# Patient Record
Sex: Male | Born: 1945 | Race: White | Hispanic: No | Marital: Married | Smoking: Former smoker
Health system: Southern US, Community
[De-identification: ages and names within clinical notes are randomized; demographics above are authoritative.]

## PROBLEM LIST (undated history)

## (undated) DIAGNOSIS — I5189 Other ill-defined heart diseases: Secondary | ICD-10-CM

## (undated) DIAGNOSIS — Z87442 Personal history of urinary calculi: Secondary | ICD-10-CM

## (undated) DIAGNOSIS — M5126 Other intervertebral disc displacement, lumbar region: Secondary | ICD-10-CM

## (undated) DIAGNOSIS — I252 Old myocardial infarction: Secondary | ICD-10-CM

## (undated) DIAGNOSIS — Z9889 Other specified postprocedural states: Secondary | ICD-10-CM

## (undated) DIAGNOSIS — F419 Anxiety disorder, unspecified: Secondary | ICD-10-CM

## (undated) DIAGNOSIS — K4090 Unilateral inguinal hernia, without obstruction or gangrene, not specified as recurrent: Secondary | ICD-10-CM

## (undated) DIAGNOSIS — I2510 Atherosclerotic heart disease of native coronary artery without angina pectoris: Secondary | ICD-10-CM

## (undated) DIAGNOSIS — K5790 Diverticulosis of intestine, part unspecified, without perforation or abscess without bleeding: Secondary | ICD-10-CM

## (undated) DIAGNOSIS — F410 Panic disorder [episodic paroxysmal anxiety] without agoraphobia: Secondary | ICD-10-CM

## (undated) DIAGNOSIS — I509 Heart failure, unspecified: Secondary | ICD-10-CM

## (undated) DIAGNOSIS — K8020 Calculus of gallbladder without cholecystitis without obstruction: Secondary | ICD-10-CM

## (undated) DIAGNOSIS — M1990 Unspecified osteoarthritis, unspecified site: Secondary | ICD-10-CM

## (undated) DIAGNOSIS — I358 Other nonrheumatic aortic valve disorders: Secondary | ICD-10-CM

## (undated) DIAGNOSIS — E785 Hyperlipidemia, unspecified: Secondary | ICD-10-CM

## (undated) DIAGNOSIS — D649 Anemia, unspecified: Secondary | ICD-10-CM

## (undated) DIAGNOSIS — I714 Abdominal aortic aneurysm, without rupture, unspecified: Secondary | ICD-10-CM

## (undated) DIAGNOSIS — Z720 Tobacco use: Secondary | ICD-10-CM

## (undated) DIAGNOSIS — E669 Obesity, unspecified: Secondary | ICD-10-CM

## (undated) DIAGNOSIS — J189 Pneumonia, unspecified organism: Secondary | ICD-10-CM

## (undated) DIAGNOSIS — F1010 Alcohol abuse, uncomplicated: Secondary | ICD-10-CM

## (undated) DIAGNOSIS — H269 Unspecified cataract: Secondary | ICD-10-CM

## (undated) DIAGNOSIS — T8859XA Other complications of anesthesia, initial encounter: Secondary | ICD-10-CM

## (undated) DIAGNOSIS — I10 Essential (primary) hypertension: Secondary | ICD-10-CM

## (undated) DIAGNOSIS — K760 Fatty (change of) liver, not elsewhere classified: Secondary | ICD-10-CM

## (undated) DIAGNOSIS — I219 Acute myocardial infarction, unspecified: Secondary | ICD-10-CM

## (undated) DIAGNOSIS — C61 Malignant neoplasm of prostate: Secondary | ICD-10-CM

## (undated) DIAGNOSIS — D696 Thrombocytopenia, unspecified: Secondary | ICD-10-CM

## (undated) DIAGNOSIS — Z8679 Personal history of other diseases of the circulatory system: Secondary | ICD-10-CM

## (undated) DIAGNOSIS — Z9841 Cataract extraction status, right eye: Secondary | ICD-10-CM

## (undated) DIAGNOSIS — J449 Chronic obstructive pulmonary disease, unspecified: Secondary | ICD-10-CM

## (undated) DIAGNOSIS — I340 Nonrheumatic mitral (valve) insufficiency: Secondary | ICD-10-CM

## (undated) DIAGNOSIS — C801 Malignant (primary) neoplasm, unspecified: Secondary | ICD-10-CM

## (undated) DIAGNOSIS — Z7982 Long term (current) use of aspirin: Secondary | ICD-10-CM

## (undated) DIAGNOSIS — N200 Calculus of kidney: Secondary | ICD-10-CM

## (undated) DIAGNOSIS — N529 Male erectile dysfunction, unspecified: Secondary | ICD-10-CM

## (undated) DIAGNOSIS — I700 Atherosclerosis of aorta: Secondary | ICD-10-CM

## (undated) DIAGNOSIS — I5032 Chronic diastolic (congestive) heart failure: Secondary | ICD-10-CM

## (undated) HISTORY — DX: Other ill-defined heart diseases: I51.89

## (undated) HISTORY — PX: CATARACT EXTRACTION: SUR2

## (undated) HISTORY — PX: BACK SURGERY: SHX140

## (undated) HISTORY — DX: Acute myocardial infarction, unspecified: I21.9

## (undated) HISTORY — DX: Chronic diastolic (congestive) heart failure: I50.32

## (undated) HISTORY — DX: Unspecified cataract: H26.9

## (undated) HISTORY — DX: Personal history of urinary calculi: Z87.442

## (undated) HISTORY — DX: Panic disorder (episodic paroxysmal anxiety): F41.0

## (undated) HISTORY — PX: COLONOSCOPY: SHX174

## (undated) HISTORY — DX: Obesity, unspecified: E66.9

## (undated) HISTORY — PX: MICRODISCECTOMY LUMBAR: SUR864

## (undated) HISTORY — DX: Alcohol abuse, uncomplicated: F10.10

## (undated) HISTORY — DX: Chronic obstructive pulmonary disease, unspecified: J44.9

## (undated) HISTORY — DX: Atherosclerotic heart disease of native coronary artery without angina pectoris: I25.10

## (undated) HISTORY — DX: Essential (primary) hypertension: I10

## (undated) HISTORY — DX: Abdominal aortic aneurysm, without rupture: I71.4

## (undated) HISTORY — DX: Hyperlipidemia, unspecified: E78.5

## (undated) HISTORY — PX: OTHER SURGICAL HISTORY: SHX169

## (undated) HISTORY — DX: Abdominal aortic aneurysm, without rupture, unspecified: I71.40

## (undated) HISTORY — DX: Other nonrheumatic aortic valve disorders: I35.8

## (undated) HISTORY — DX: Tobacco use: Z72.0

## (undated) HISTORY — DX: Fatty (change of) liver, not elsewhere classified: K76.0

---

## 2003-11-13 ENCOUNTER — Emergency Department (HOSPITAL_COMMUNITY): Admission: EM | Admit: 2003-11-13 | Discharge: 2003-11-14 | Payer: Self-pay | Admitting: Emergency Medicine

## 2005-10-05 DIAGNOSIS — I2119 ST elevation (STEMI) myocardial infarction involving other coronary artery of inferior wall: Secondary | ICD-10-CM

## 2005-10-05 HISTORY — DX: ST elevation (STEMI) myocardial infarction involving other coronary artery of inferior wall: I21.19

## 2006-08-05 HISTORY — PX: CARDIAC CATHETERIZATION: SHX172

## 2006-08-06 ENCOUNTER — Inpatient Hospital Stay (HOSPITAL_COMMUNITY): Admission: EM | Admit: 2006-08-06 | Discharge: 2006-08-09 | Payer: Self-pay | Admitting: Emergency Medicine

## 2006-10-05 HISTORY — PX: CARDIAC CATHETERIZATION: SHX172

## 2006-10-23 ENCOUNTER — Emergency Department (HOSPITAL_COMMUNITY): Admission: EM | Admit: 2006-10-23 | Discharge: 2006-10-23 | Payer: Self-pay | Admitting: Emergency Medicine

## 2008-05-12 ENCOUNTER — Ambulatory Visit: Payer: Self-pay | Admitting: Internal Medicine

## 2008-06-12 HISTORY — PX: MICRODISCECTOMY LUMBAR: SUR864

## 2010-04-10 ENCOUNTER — Ambulatory Visit: Payer: Self-pay | Admitting: Gastroenterology

## 2014-02-05 ENCOUNTER — Ambulatory Visit (INDEPENDENT_AMBULATORY_CARE_PROVIDER_SITE_OTHER): Payer: Medicare Other | Admitting: Cardiovascular Disease

## 2014-02-05 ENCOUNTER — Encounter (INDEPENDENT_AMBULATORY_CARE_PROVIDER_SITE_OTHER): Payer: Self-pay

## 2014-02-05 ENCOUNTER — Encounter: Payer: Self-pay | Admitting: Cardiovascular Disease

## 2014-02-05 VITALS — BP 153/89 | HR 58 | Ht 72.0 in | Wt 204.8 lb

## 2014-02-05 DIAGNOSIS — I10 Essential (primary) hypertension: Secondary | ICD-10-CM

## 2014-02-05 DIAGNOSIS — R0600 Dyspnea, unspecified: Secondary | ICD-10-CM

## 2014-02-05 DIAGNOSIS — E785 Hyperlipidemia, unspecified: Secondary | ICD-10-CM

## 2014-02-05 DIAGNOSIS — I714 Abdominal aortic aneurysm, without rupture, unspecified: Secondary | ICD-10-CM

## 2014-02-05 DIAGNOSIS — I2510 Atherosclerotic heart disease of native coronary artery without angina pectoris: Secondary | ICD-10-CM

## 2014-02-05 DIAGNOSIS — R000 Tachycardia, unspecified: Secondary | ICD-10-CM

## 2014-02-05 DIAGNOSIS — F17200 Nicotine dependence, unspecified, uncomplicated: Secondary | ICD-10-CM

## 2014-02-05 DIAGNOSIS — R0609 Other forms of dyspnea: Secondary | ICD-10-CM

## 2014-02-05 DIAGNOSIS — R0989 Other specified symptoms and signs involving the circulatory and respiratory systems: Secondary | ICD-10-CM

## 2014-02-05 DIAGNOSIS — Z720 Tobacco use: Secondary | ICD-10-CM

## 2014-02-05 MED ORDER — METOPROLOL SUCCINATE ER 50 MG PO TB24: 50.0000 mg | ORAL_TABLET | Freq: Every day | ORAL | Status: DC

## 2014-02-05 MED ORDER — ENALAPRIL MALEATE 20 MG PO TABS: 20.0000 mg | ORAL_TABLET | Freq: Every day | ORAL | Status: DC

## 2014-02-05 MED ORDER — ATORVASTATIN CALCIUM 80 MG PO TABS: 40.0000 mg | ORAL_TABLET | Freq: Every day | ORAL | Status: DC

## 2014-02-05 NOTE — Patient Instructions (Addendum)
Your physician has recommended you make the following change in your medication:  Take Atorvastatin 40 mg daily (1/2 a tablet)  Take Enalapril 20 mg once daily  Take Toprol XL 50 mg once daily   Your physician recommends that you return for lab work in:  Fasting labs in 6 weeks   Your physician has requested that you have an echocardiogram. Echocardiography is a painless test that uses sound waves to create images of your heart. It provides your doctor with information about the size and shape of your heart and how well your heart's chambers and valves are working. This procedure takes approximately one hour. There are no restrictions for this procedure.   Your physician wants you to follow-up in: 6 months. You will receive a reminder letter in the mail two months in advance. If you don't receive a letter, please call our office to schedule the follow-up appointment.

## 2014-02-09 ENCOUNTER — Encounter: Payer: Self-pay | Admitting: Cardiovascular Disease

## 2014-02-09 DIAGNOSIS — I10 Essential (primary) hypertension: Secondary | ICD-10-CM | POA: Insufficient documentation

## 2014-02-09 DIAGNOSIS — I714 Abdominal aortic aneurysm, without rupture, unspecified: Secondary | ICD-10-CM | POA: Insufficient documentation

## 2014-02-09 DIAGNOSIS — Z720 Tobacco use: Secondary | ICD-10-CM | POA: Insufficient documentation

## 2014-02-09 DIAGNOSIS — E785 Hyperlipidemia, unspecified: Secondary | ICD-10-CM | POA: Insufficient documentation

## 2014-02-09 DIAGNOSIS — I2510 Atherosclerotic heart disease of native coronary artery without angina pectoris: Secondary | ICD-10-CM | POA: Insufficient documentation

## 2014-02-09 NOTE — Assessment & Plan Note (Signed)
It appears that this was most recently evaluated in 2012. If this has not been checked recently, I will plan on obtaining abdominal aortic ultrasound.

## 2014-02-09 NOTE — Assessment & Plan Note (Signed)
I asked him to resume atorvastatin 40 mg once daily. Check fasting lipid and liver profile in 6 weeks.

## 2014-02-09 NOTE — Progress Notes (Addendum)
Primary care physician: Dr. Yates DecampJohn Sandoval  HPI  This is a 68 year old man who is here today to establish cardiovascular care. His wife Bobby Hashimotoatricia is one of my patients. He use to followup with Dr. Ty Sandoval but has not seen him in many years. He has known history of coronary artery disease with previous myocardial infarction in 2007. He was treated with bare-metal stent placement x2 to the mid RCA. He [facility]. He has chronic medical conditions that include hypertension, hyperlipidemia, tobacco use and COPD. He also seems to consume excessive alcohol with 12 pack/ beer daily. He denies any recreational drug use. He complains of significant exertional shortness of breath but no chest pain or discomfort. No orthopnea or PND. He has not been taking all his medications regularly.  No Known Allergies   No current outpatient prescriptions on file prior to visit.   No current facility-administered medications on file prior to visit.     Past Medical History  Diagnosis Date  . MI (myocardial infarction)   . Panic disorder   . COPD (chronic obstructive pulmonary disease)   . History of kidney stones   . Obesity   . Tobacco abuse   . AAA (abdominal aortic aneurysm)   . CAD (coronary artery disease)     Previous RCA stents x2 in 2007 and mid LAD in 2008  . Hyperlipidemia   . Hypertension      Past Surgical History  Procedure Laterality Date  . Microdiscectomy lumbar    . Cardiac catheterization  08/2006    x1 stent [facility]. Mid RCA: 3.5 x 16 mm overlap with 3.5 x 24 mm Liberte drug-eluting stents  . Cardiac catheterization  10/2006    x1 stent [facility]: Mid LAD: 2.5 x 28 mm Cypher drug-eluting     Family History  Problem Relation Age of Onset  . Hypertension Mother   . Heart disease Father      History   Social History  . Marital Status: Married    Spouse Name: N/A    Number of Children: N/A  . Years of Education: N/A    Occupational History  . Not on file.   Social History Main Topics  . Smoking status: Current Every Day Smoker -- 2.00 packs/day for 10 years    Types: Cigarettes  . Smokeless tobacco: Not on file  . Alcohol Use: Yes     Comment: daily  . Drug Use: No  . Sexual Activity: Not on file   Other Topics Concern  . Not on file   Social History Narrative  . No narrative on file     ROS A 10 point review of system was performed. It is negative other than that mentioned in the history of present illness.   PHYSICAL EXAM   BP 153/89  Pulse 58  Ht 6' (1.829 m)  Wt 204 lb 12 oz (92.874 kg)  BMI 27.76 kg/m2 Constitutional: He is oriented to person, place, and time. He appears well-developed and well-nourished. No distress.  HENT: No nasal discharge.  Head: Normocephalic and atraumatic.  Eyes: Pupils are equal and round.  No discharge. Neck: Normal range of motion. Neck supple. No JVD present. No thyromegaly present.  Cardiovascular: Normal rate, regular rhythm, normal heart sounds. Exam reveals no gallop and no friction rub. No murmur heard.  Pulmonary/Chest: Effort normal and breath sounds normal. No stridor. No respiratory distress. He has no wheezes. He has no rales.  He exhibits no tenderness.  Abdominal: Soft. Bowel sounds are normal. He exhibits no distension. There is no tenderness. There is no rebound and no guarding.  Musculoskeletal: Normal range of motion. He exhibits no edema and no tenderness.  Neurological: He is alert and oriented to person, place, and time. Coordination normal.  Skin: Skin is warm and dry. No rash noted. He is not diaphoretic. No erythema. No pallor.  Psychiatric: He has a normal mood and affect. His behavior is normal. Judgment and thought content normal.       ZOX:WRUEAEKG:Sinus  Bradycardia  -With rate variation  cv = 13. -RSR(V1) -nondiagnostic.   -Old anterior infarct.   ABNORMAL    ASSESSMENT AND PLAN

## 2014-02-09 NOTE — Assessment & Plan Note (Signed)
Blood pressure is mildly elevated but he has not been taking his medications regularly. I might consider switching metoprolol to carvedilol given resting bradycardia.

## 2014-02-09 NOTE — Assessment & Plan Note (Addendum)
I discussed with him the importance of smoking cessation. 

## 2014-02-09 NOTE — Assessment & Plan Note (Signed)
He seems to be doing reasonably well. However, he had significant exertional dyspnea. I recommend an echocardiogram to evaluate LV systolic function. We discussed the possibility of stress testing but he prefers not to go that route. Continue medical therapy for now.

## 2014-02-13 ENCOUNTER — Telehealth: Payer: Self-pay | Admitting: *Deleted

## 2014-02-13 DIAGNOSIS — I714 Abdominal aortic aneurysm, without rupture, unspecified: Secondary | ICD-10-CM

## 2014-02-13 NOTE — Telephone Encounter (Signed)
Informed patient of Dr. Aridas recommendation Patient verbalized understanding   

## 2014-02-13 NOTE — Telephone Encounter (Signed)
Message copied by Fransico SettersBAUCOM, Lancer Thurner E on Tue Feb 13, 2014  9:11 AM ------      Message from: Lorine BearsARIDA, MUHAMMAD A      Created: Fri Feb 09, 2014 10:55 AM       [facility]. That records mention an abdominal aortic aneurysm.      If this has not been checked in the last year, I recommend scheduling an abdominal aortic ultrasound in our office for evaluation. ------

## 2014-02-16 ENCOUNTER — Other Ambulatory Visit: Payer: Medicare Other

## 2014-02-19 ENCOUNTER — Other Ambulatory Visit: Payer: Self-pay

## 2014-02-19 ENCOUNTER — Other Ambulatory Visit (INDEPENDENT_AMBULATORY_CARE_PROVIDER_SITE_OTHER): Payer: Medicare Other

## 2014-02-19 ENCOUNTER — Encounter (INDEPENDENT_AMBULATORY_CARE_PROVIDER_SITE_OTHER): Payer: Medicare Other

## 2014-02-19 DIAGNOSIS — I714 Abdominal aortic aneurysm, without rupture, unspecified: Secondary | ICD-10-CM

## 2014-02-19 DIAGNOSIS — R000 Tachycardia, unspecified: Secondary | ICD-10-CM

## 2014-02-19 DIAGNOSIS — R0602 Shortness of breath: Secondary | ICD-10-CM

## 2014-02-19 DIAGNOSIS — I2510 Atherosclerotic heart disease of native coronary artery without angina pectoris: Secondary | ICD-10-CM

## 2014-02-22 ENCOUNTER — Telehealth: Payer: Self-pay | Admitting: *Deleted

## 2014-02-22 DIAGNOSIS — I714 Abdominal aortic aneurysm, without rupture, unspecified: Secondary | ICD-10-CM

## 2014-02-22 NOTE — Telephone Encounter (Signed)
Message copied by Fransico SettersBAUCOM, Mackensie Pilson E on Thu Feb 22, 2014 12:22 PM ------      Message from: Lorine BearsARIDA, MUHAMMAD A      Created: Wed Feb 21, 2014  2:54 PM       He does have an aortic aneurysm which is medium in size. No need for surgery now but this should be monitored. Recommend a follow up AAA duplex in 6 months. ------

## 2014-02-22 NOTE — Telephone Encounter (Signed)
error 

## 2014-03-19 ENCOUNTER — Ambulatory Visit (INDEPENDENT_AMBULATORY_CARE_PROVIDER_SITE_OTHER): Payer: Medicare Other | Admitting: *Deleted

## 2014-03-19 DIAGNOSIS — E785 Hyperlipidemia, unspecified: Secondary | ICD-10-CM

## 2014-03-20 LAB — LIPID PANEL
CHOL/HDL RATIO: 1.9 ratio (ref 0.0–5.0)
Cholesterol, Total: 168 mg/dL (ref 100–199)
HDL: 89 mg/dL (ref >39)
LDL CALC: 72 mg/dL (ref 0–99)
TRIGLYCERIDES: 34 mg/dL (ref 0–149)
VLDL CHOLESTEROL CAL: 7 mg/dL (ref 5–40)

## 2014-03-20 LAB — HEPATIC FUNCTION PANEL
ALBUMIN: 4.3 g/dL (ref 3.6–4.8)
ALK PHOS: 82 IU/L (ref 39–117)
ALT: 28 IU/L (ref 0–44)
AST: 29 IU/L (ref 0–40)
Bilirubin, Direct: 0.28 mg/dL (ref 0.00–0.40)
Total Bilirubin: 1 mg/dL (ref 0.0–1.2)
Total Protein: 6.4 g/dL (ref 6.0–8.5)

## 2014-10-15 ENCOUNTER — Encounter: Payer: Self-pay | Admitting: Cardiovascular Disease

## 2014-10-15 ENCOUNTER — Ambulatory Visit (INDEPENDENT_AMBULATORY_CARE_PROVIDER_SITE_OTHER): Payer: Medicare Other | Admitting: Cardiovascular Disease

## 2014-10-15 ENCOUNTER — Encounter (INDEPENDENT_AMBULATORY_CARE_PROVIDER_SITE_OTHER): Payer: Self-pay

## 2014-10-15 VITALS — BP 138/84 | HR 49 | Ht 72.0 in | Wt 200.8 lb

## 2014-10-15 DIAGNOSIS — Z720 Tobacco use: Secondary | ICD-10-CM

## 2014-10-15 DIAGNOSIS — I2510 Atherosclerotic heart disease of native coronary artery without angina pectoris: Secondary | ICD-10-CM

## 2014-10-15 DIAGNOSIS — I714 Abdominal aortic aneurysm, without rupture, unspecified: Secondary | ICD-10-CM

## 2014-10-15 DIAGNOSIS — E785 Hyperlipidemia, unspecified: Secondary | ICD-10-CM

## 2014-10-15 DIAGNOSIS — I10 Essential (primary) hypertension: Secondary | ICD-10-CM

## 2014-10-15 MED ORDER — ENALAPRIL MALEATE 20 MG PO TABS: 20.0000 mg | ORAL_TABLET | Freq: Two times a day (BID) | ORAL | Status: DC

## 2014-10-15 MED ORDER — ATORVASTATIN CALCIUM 80 MG PO TABS: 40.0000 mg | ORAL_TABLET | Freq: Every day | ORAL | Status: DC

## 2014-10-15 NOTE — Assessment & Plan Note (Signed)
Lab Results  Component Value Date   HDL 89 03/19/2014   LDLCALC 72 03/19/2014   TRIG 34 03/19/2014   CHOLHDL 1.9 03/19/2014   LDL is at target. Continue treatment with atorvastatin.

## 2014-10-15 NOTE — Assessment & Plan Note (Signed)
He is doing reasonably well and denies any symptoms suggestive of angina. Continue medical therapy.

## 2014-10-15 NOTE — Patient Instructions (Signed)
Your physician has recommended you make the following change in your medication:  Increase Enalapril to 20 mg twice daily   Your physician has requested that you have an abdominal aorta duplex in 6 months on the same day as your follow up visit with Dr. Kirke CorinArida. During this test, an ultrasound is used to evaluate the aorta. Allow 30 minutes for this exam. Do not eat after midnight the day before and avoid carbonated beverages  Your physician wants you to follow-up in: In 6 months. You will receive a reminder letter in the mail two months in advance. If you don't receive a letter, please call our office to schedule the follow-up appointment.

## 2014-10-15 NOTE — Addendum Note (Signed)
Addended by: Fransico SettersBAUCOM, Zania Kalisz E on: 10/15/2014 11:24 AM   Modules accepted: Orders

## 2014-10-15 NOTE — Assessment & Plan Note (Signed)
I recommend more aggressive control of blood pressure given abdominal aortic aneurysm. I change enalapril to 20 mg twice daily.

## 2014-10-15 NOTE — Progress Notes (Signed)
Primary care physician: Dr. Yates DecampJohn Sandoval  HPI  This is a 69 year old man who is here today for a follow-up visit regarding coronary artery disease.  He has known history of coronary artery disease with previous myocardial infarction in 2007. He was treated with bare-metal stent placement x2 to the mid RCA. He subsequently [facility]. He has chronic medical conditions that include hypertension, hyperlipidemia, tobacco use, abdominal aortic aneurysm  and COPD. most recent ultrasound showed the size to be 4 x 4 centimeters. He also seems to consume excessive alcohol with 12 pack/ beer daily. He denies any recreational drug use. He complains of significant exertional dyspnea but no chest pain. Dyspnea has been chronic. No orthopnea or PND.  Unfortunately, he continues to smoke. He drinks at least a sixpack of beer daily.  No Known Allergies   Current Outpatient Prescriptions on File Prior to Visit  Medication Sig Dispense Refill  . ALPRAZolam (XANAX) 0.5 MG tablet Take 0.5 mg by mouth 3 (three) times daily as needed for anxiety.    Marland Kitchen. aspirin 325 MG tablet Take 325 mg by mouth daily.    Marland Kitchen. atorvastatin (LIPITOR) 80 MG tablet Take 0.5 tablets (40 mg total) by mouth daily. 30 tablet 6  . enalapril (VASOTEC) 20 MG tablet Take 1 tablet (20 mg total) by mouth daily. 30 tablet 6  . metoprolol succinate (TOPROL-XL) 50 MG 24 hr tablet Take 1 tablet (50 mg total) by mouth daily. Take with or immediately following a meal. 30 tablet 6  . nitroGLYCERIN (NITROSTAT) 0.4 MG SL tablet Place 0.4 mg under the tongue every 5 (five) minutes as needed for chest pain.     No current facility-administered medications on file prior to visit.     Past Medical History  Diagnosis Date  . MI (myocardial infarction)   . Panic disorder   . COPD (chronic obstructive pulmonary disease)   . History of kidney stones   . Obesity   . Tobacco abuse   . AAA (abdominal aortic  aneurysm)   . Hyperlipidemia   . Hypertension   . AAA (abdominal aortic aneurysm)     3.4 cm 2012  . ETOH abuse   . CAD (coronary artery disease)     Previous RCA stents x2 in 2007 4 inferior ST elevation MI and mid LAD in 2008     Past Surgical History  Procedure Laterality Date  . Microdiscectomy lumbar    . Cardiac catheterization  08/2006    x1 stent [facility]. Mid RCA: 3.5 x 16 mm overlap with 3.5 x 24 mm Liberte bare-metal stents  . Cardiac catheterization  10/2006    x1 stent [facility]: Mid LAD: 2.5 x 28 mm Cypher drug-eluting     Family History  Problem Relation Age of Onset  . Hypertension Mother   . Heart disease Father      History   Social History  . Marital Status: Married    Spouse Name: N/A    Number of Children: N/A  . Years of Education: N/A   Occupational History  . Not on file.   Social History Main Topics  . Smoking status: Current Every Day Smoker -- 2.00 packs/day for 10 years    Types: Cigarettes  . Smokeless tobacco: Not on file  . Alcohol Use: Yes     Comment: daily  . Drug Use: No  . Sexual Activity: Not on file   Other Topics Concern  .  Not on file   Social History Narrative     ROS A 10 point review of system was performed. It is negative other than that mentioned in the history of present illness.   PHYSICAL EXAM   BP 138/84 mmHg  Pulse 49  Ht 6' (1.829 m)  Wt 200 lb 12 oz (91.06 kg)  BMI 27.22 kg/m2 Constitutional: He is oriented to person, place, and time. He appears well-developed and well-nourished. No distress.  HENT: No nasal discharge.  Head: Normocephalic and atraumatic.  Eyes: Pupils are equal and round.  No discharge. Neck: Normal range of motion. Neck supple. No JVD present. No thyromegaly present.  Cardiovascular: Normal rate, regular rhythm, normal heart sounds. Exam reveals no gallop and no friction rub. No murmur heard.  Pulmonary/Chest: Effort normal and breath sounds normal. No stridor. No respiratory distress. He  has no wheezes. He has no rales. He exhibits no tenderness.  Abdominal: Soft. Bowel sounds are normal. He exhibits no distension. There is no tenderness. There is no rebound and no guarding.  Musculoskeletal: Normal range of motion. He exhibits no edema and no tenderness.  Neurological: He is alert and oriented to person, place, and time. Coordination normal.  Skin: Skin is warm and dry. No rash noted. He is not diaphoretic. No erythema. No pallor.  Psychiatric: He has a normal mood and affect. His behavior is normal. Judgment and thought content normal.       EKG: Marked sinus  Bradycardia  -With rate variation  cv = 10. -RSR(V1) -nondiagnostic.   BORDERLINE RHYTHM    ASSESSMENT AND PLAN

## 2014-10-15 NOTE — Assessment & Plan Note (Signed)
I discussed with him the importance of smoking cessation but he is not able to quit at the present time.

## 2014-10-15 NOTE — Assessment & Plan Note (Signed)
This was 4 x 4 centimeter on most recent evaluation. I recommend a follow-up abdominal aortic ultrasound in 6 months from now.

## 2015-05-27 ENCOUNTER — Telehealth: Payer: Self-pay

## 2015-05-27 ENCOUNTER — Other Ambulatory Visit: Payer: Self-pay | Admitting: Cardiovascular Disease

## 2015-05-27 DIAGNOSIS — I714 Abdominal aortic aneurysm, without rupture, unspecified: Secondary | ICD-10-CM

## 2015-05-27 NOTE — Telephone Encounter (Signed)
Pt cannot remember his results from his AAA. Also, please verify pt is due for 6 months AAA.

## 2015-05-27 NOTE — Telephone Encounter (Signed)
S/w pt who states Bobby Sandoval contacted him and scheduled AAA for 8/23 Reviewed results of 5/15 test. Pt verbalized understanding with no further questions.

## 2015-05-28 ENCOUNTER — Ambulatory Visit (INDEPENDENT_AMBULATORY_CARE_PROVIDER_SITE_OTHER): Payer: Medicare Other

## 2015-05-28 DIAGNOSIS — I714 Abdominal aortic aneurysm, without rupture, unspecified: Secondary | ICD-10-CM

## 2015-05-31 ENCOUNTER — Other Ambulatory Visit: Payer: Self-pay

## 2015-05-31 DIAGNOSIS — I714 Abdominal aortic aneurysm, without rupture, unspecified: Secondary | ICD-10-CM

## 2015-07-01 ENCOUNTER — Other Ambulatory Visit: Payer: Self-pay | Admitting: *Deleted

## 2015-07-01 MED ORDER — ENALAPRIL MALEATE 20 MG PO TABS: 20.0000 mg | ORAL_TABLET | Freq: Two times a day (BID) | ORAL | Status: DC

## 2015-07-08 ENCOUNTER — Ambulatory Visit (INDEPENDENT_AMBULATORY_CARE_PROVIDER_SITE_OTHER): Payer: Medicare Other | Admitting: Cardiovascular Disease

## 2015-07-08 ENCOUNTER — Encounter: Payer: Self-pay | Admitting: Cardiovascular Disease

## 2015-07-08 VITALS — BP 110/62 | HR 55 | Ht 72.0 in | Wt 205.2 lb

## 2015-07-08 DIAGNOSIS — I25118 Atherosclerotic heart disease of native coronary artery with other forms of angina pectoris: Secondary | ICD-10-CM

## 2015-07-08 DIAGNOSIS — R0602 Shortness of breath: Secondary | ICD-10-CM | POA: Diagnosis not present

## 2015-07-08 DIAGNOSIS — I10 Essential (primary) hypertension: Secondary | ICD-10-CM | POA: Diagnosis not present

## 2015-07-08 DIAGNOSIS — Z720 Tobacco use: Secondary | ICD-10-CM

## 2015-07-08 DIAGNOSIS — I714 Abdominal aortic aneurysm, without rupture, unspecified: Secondary | ICD-10-CM

## 2015-07-08 DIAGNOSIS — I2510 Atherosclerotic heart disease of native coronary artery without angina pectoris: Secondary | ICD-10-CM

## 2015-07-08 DIAGNOSIS — Z23 Encounter for immunization: Secondary | ICD-10-CM

## 2015-07-08 DIAGNOSIS — E785 Hyperlipidemia, unspecified: Secondary | ICD-10-CM

## 2015-07-08 MED ORDER — ASPIRIN EC 81 MG PO TBEC: 81.0000 mg | DELAYED_RELEASE_TABLET | Freq: Every day | ORAL | Status: DC

## 2015-07-08 NOTE — Assessment & Plan Note (Signed)
Lab Results  Component Value Date   CHOL 168 03/19/2014   HDL 89 03/19/2014   LDLCALC 72 03/19/2014   TRIG 34 03/19/2014   CHOLHDL 1.9 03/19/2014   Continue treatment with atorvastatin. 

## 2015-07-08 NOTE — Assessment & Plan Note (Signed)
Blood pressure is better controlled now after increasing enalapril during last visit.

## 2015-07-08 NOTE — Assessment & Plan Note (Signed)
The patient has known history of coronary artery disease with previous stenting of the right coronary artery and LAD. He is having worsening exertional dyspnea with no recent ischemic cardiac evaluation. Thus, I recommend evaluation with a treadmill nuclear stress test. I decreased the dose of aspirin to 81 mg once daily.

## 2015-07-08 NOTE — Assessment & Plan Note (Signed)
I discussed with him the importance of smoking cessation and also decreasing alcohol intake. The patient reports inability to do that.

## 2015-07-08 NOTE — Patient Instructions (Addendum)
Medication Instructions:  Your physician has recommended you make the following change in your medication:  DECREASE aspirin to  once per day   Labwork: none  Testing/Procedures: Your physician has requested that you have a lexiscan myoview. For further information please visit [email or web address] Please follow instruction sheet, as given.  [facility] MYOVIEW  Your caregiver has ordered a Stress Test with nuclear imaging. The purpose of this test is to evaluate the blood supply to your heart muscle. This procedure is referred to as a "Non-Invasive Stress Test." This is because other than having an IV started in your vein, nothing is inserted or "invades" your body. Cardiac stress tests are done to find areas of poor blood flow to the heart by determining the extent of coronary artery disease (CAD). Some patients exercise on a treadmill, which naturally increases the blood flow to your heart, while others who are  unable to walk on a treadmill due to physical limitations have a pharmacologic/chemical stress agent called Lexiscan . This medicine will mimic walking on a treadmill by temporarily increasing your coronary blood flow.   Please note: these test may take anywhere between 2-4 hours to complete  PLEASE REPORT TO [facility]  THE VOLUNTEERS AT THE FIRST DESK WILL DIRECT YOU WHERE TO GO  Date of Procedure: Thurs, Oct 6, 8:00am Arrival Time for Procedure: 7:30am  Instructions regarding medication:    _xx___:  Hold metoprolol night before procedure and morning of procedure  PLEASE NOTIFY THE OFFICE AT LEAST 24 HOURS IN ADVANCE IF YOU ARE UNABLE TO KEEP YOUR APPOINTMENT.  [phone] AND  PLEASE NOTIFY NUCLEAR MEDICINE AT [facility] AT LEAST 24 HOURS IN ADVANCE IF YOU ARE UNABLE TO KEEP YOUR APPOINTMENT. [phone]  How to prepare for your Myoview test:   Do not eat or drink after midnight  No caffeine for 24 hours prior to test  No smoking 24 hours prior to  test.  Your medication may be taken with water.  If your doctor stopped a medication because of this test, do not take that medication.  Ladies, please do not wear dresses.  Skirts or pants are appropriate. Please wear a short sleeve shirt.  No perfume, cologne or lotion.  Wear comfortable walking shoes. No heels!            Follow-Up: Your physician wants you to follow-up in: six months with Dr. Kirke Corin.  You will receive a reminder letter in the mail two months in advance. If you don't receive a letter, please call our office to schedule the follow-up appointment.   Any Other Special Instructions Will Be Listed Below (If Applicable).

## 2015-07-08 NOTE — Assessment & Plan Note (Signed)
This was 4 x 4 centimeter on most recent evaluation. I recommend a follow-up abdominal aortic ultrasound in 6 months from now.

## 2015-07-08 NOTE — Progress Notes (Signed)
Primary care physician: Dr. Yates DecampJohn Sandoval  HPI  This is a 69 year old man who is here today for a follow-up visit regarding coronary artery disease.  He has known history of coronary artery disease with previous myocardial infarction in 2007. He was treated with bare-metal stent placement x2 to the mid RCA. He subsequently [facility]. He has chronic medical conditions that include hypertension, hyperlipidemia, tobacco use, abdominal aortic aneurysm , excessive alcohol use and COPD. most recent ultrasound showed the size to be 4 x 4 centimeters.  He reports worsening exertional dyspnea without chest discomfort. This has been more noticeable over the last 6 months. No orthopnea or PND. He also noted some leg edema worse on the right side. He denies claudication. Unfortunately, he continues to smoke 2 packs per day and drinks a 12 pack of beer daily.   No Known Allergies   Current Outpatient Prescriptions on File Prior to Visit  Medication Sig Dispense Refill  . ALPRAZolam (XANAX) 0.5 MG tablet Take 0.5 mg by mouth 3 (three) times daily as needed for anxiety.    Marland Kitchen. aspirin 325 MG tablet Take 325 mg by mouth daily.    Marland Kitchen. atorvastatin (LIPITOR) 80 MG tablet Take 0.5 tablets (40 mg total) by mouth daily. 30 tablet 6  . enalapril (VASOTEC) 20 MG tablet Take 1 tablet (20 mg total) by mouth 2 (two) times daily. 60 tablet 3  . metoprolol succinate (TOPROL-XL) 50 MG 24 hr tablet Take 1 tablet (50 mg total) by mouth daily. Take with or immediately following a meal. 30 tablet 6  . nitroGLYCERIN (NITROSTAT) 0.4 MG SL tablet Place 0.4 mg under the tongue every 5 (five) minutes as needed for chest pain.     No current facility-administered medications on file prior to visit.     Past Medical History  Diagnosis Date  . MI (myocardial infarction) (HCC)   . Panic disorder   . COPD (chronic obstructive pulmonary disease) (HCC)   . History of kidney stones     . Obesity   . Tobacco abuse   . AAA (abdominal aortic aneurysm) (HCC)   . Hyperlipidemia   . Hypertension   . AAA (abdominal aortic aneurysm) (HCC)     3.4 cm 2012  . ETOH abuse   . CAD (coronary artery disease)     Previous RCA stents x2 in 2007 4 inferior ST elevation MI and mid LAD in 2008     Past Surgical History  Procedure Laterality Date  . Microdiscectomy lumbar    . Cardiac catheterization  08/2006    x1 stent [facility]. Mid RCA: 3.5 x 16 mm overlap with 3.5 x 24 mm Liberte bare-metal stents  . Cardiac catheterization  10/2006    x1 stent [facility]: Mid LAD: 2.5 x 28 mm Cypher drug-eluting     Family History  Problem Relation Age of Onset  . Hypertension Mother   . Heart disease Father      Social History   Social History  . Marital Status: Married    Spouse Name: N/A  . Number of Children: N/A  . Years of Education: N/A   Occupational History  . Not on file.   Social History Main Topics  . Smoking status: Current Every Day Smoker -- 2.00 packs/day for 10 years    Types: Cigarettes  . Smokeless tobacco: Not on file  . Alcohol Use: Yes     Comment: daily  . Drug  Use: No  . Sexual Activity: Not on file   Other Topics Concern  . Not on file   Social History Narrative     ROS A 10 point review of system was performed. It is negative other than that mentioned in the history of present illness.   PHYSICAL EXAM   BP 110/62 mmHg  Pulse 55  Ht 6' (1.829 m)  Wt 205 lb 4 oz (93.101 kg)  BMI 27.83 kg/m2 Constitutional: He is oriented to person, place, and time. He appears well-developed and well-nourished. No distress.  HENT: No nasal discharge.  Head: Normocephalic and atraumatic.  Eyes: Pupils are equal and round.  No discharge. Neck: Normal range of motion. Neck supple. No JVD present. No thyromegaly present.  Cardiovascular: Normal rate, regular rhythm, normal heart sounds. Exam reveals no gallop and no friction rub. No murmur heard.  Pulmonary/Chest:  Effort normal and breath sounds normal. No stridor. No respiratory distress. He has no wheezes. He has no rales. He exhibits no tenderness.  Abdominal: Soft. Bowel sounds are normal. He exhibits no distension. There is no tenderness. There is no rebound and no guarding.  Musculoskeletal: Normal range of motion. He exhibits trace edema and no tenderness.  Neurological: He is alert and oriented to person, place, and time. Coordination normal.  Skin: Skin is warm and dry. No rash noted. He is not diaphoretic. No erythema. No pallor.  Psychiatric: He has a normal mood and affect. His behavior is normal. Judgment and thought content normal.       EKG: Sinus bradycardia with sinus arrhythmia.  ASSESSMENT AND PLAN

## 2015-07-11 ENCOUNTER — Encounter
Admission: RE | Admit: 2015-07-11 | Discharge: 2015-07-11 | Disposition: A | Discharge status: Home / Self Care | Payer: Medicare Other | Source: Ambulatory Visit | Attending: Cardiovascular Disease | Admitting: Cardiovascular Disease

## 2015-07-11 DIAGNOSIS — I25118 Atherosclerotic heart disease of native coronary artery with other forms of angina pectoris: Secondary | ICD-10-CM

## 2015-07-11 DIAGNOSIS — R0602 Shortness of breath: Secondary | ICD-10-CM

## 2015-07-11 LAB — NM MYOCAR MULTI W/SPECT W/WALL MOTION / EF
CHL CUP NUCLEAR SDS: 6
CHL CUP NUCLEAR SRS: 9
CHL CUP RESTING HR STRESS: 70 {beats}/min
CHL CUP STRESS STAGE 1 GRADE: 0 %
CHL CUP STRESS STAGE 10 DBP: 73 mmHg
CHL CUP STRESS STAGE 2 GRADE: 0 %
CHL CUP STRESS STAGE 2 HR: 62 {beats}/min
CHL CUP STRESS STAGE 2 SPEED: 0 mph
CHL CUP STRESS STAGE 4 SPEED: 1 mph
CHL CUP STRESS STAGE 5 GRADE: 0 %
CHL CUP STRESS STAGE 5 SPEED: 1 mph
CHL CUP STRESS STAGE 6 DBP: 68 mmHg
CHL CUP STRESS STAGE 6 SBP: 163 mmHg
CHL CUP STRESS STAGE 8 SPEED: 2.2 mph
CHL CUP STRESS STAGE 9 HR: 117 {beats}/min
CHL CUP STRESS STAGE 9 SPEED: 0 mph
CSEPEW: 5.4 METS
CSEPHR: 87 %
CSEPPHR: 131 {beats}/min
CSEPPMHR: 87 %
LV dias vol: 167 mL
LV sys vol: 87 mL
NUC STRESS TID: 0.98
SSS: 13
Stage 1 HR: 60 {beats}/min
Stage 1 Speed: 0 mph
Stage 10 Grade: 0 %
Stage 10 HR: 90 {beats}/min
Stage 10 SBP: 194 mmHg
Stage 10 Speed: 0 mph
Stage 3 Grade: 0 %
Stage 3 HR: 62 {beats}/min
Stage 3 Speed: 0 mph
Stage 4 Grade: 0 %
Stage 4 HR: 78 {beats}/min
Stage 5 HR: 78 {beats}/min
Stage 6 Grade: 10 %
Stage 6 HR: 118 {beats}/min
Stage 6 Speed: 1.7 mph
Stage 7 Grade: 12 %
Stage 7 HR: 129 {beats}/min
Stage 7 Speed: 2.5 mph
Stage 8 Grade: 12 %
Stage 8 HR: 131 {beats}/min
Stage 9 Grade: 0 %

## 2015-07-11 MED ORDER — TECHNETIUM TC 99M SESTAMIBI - CARDIOLITE
29.9220 | Freq: Once | INTRAVENOUS | Status: AC | PRN
  Administered 2015-07-11: 29.922 via INTRAVENOUS

## 2015-07-11 MED ORDER — TECHNETIUM TC 99M SESTAMIBI - CARDIOLITE
12.2130 | Freq: Once | INTRAVENOUS | Status: AC | PRN
  Administered 2015-07-11: 12.213 via INTRAVENOUS

## 2015-07-17 ENCOUNTER — Telehealth: Payer: Self-pay

## 2015-07-17 ENCOUNTER — Other Ambulatory Visit: Payer: Self-pay

## 2015-07-17 DIAGNOSIS — Z01812 Encounter for preprocedural laboratory examination: Secondary | ICD-10-CM

## 2015-07-17 NOTE — Telephone Encounter (Signed)
Pt is calling to set his cath appt. Please call.

## 2015-07-17 NOTE — Telephone Encounter (Signed)
S/w pt who states he is agreeable to catheterization w/Dr. Kirke CorinArida.  States he is hesit[facility] to have [facility] because "I've lived here all my life and I've never heard anything good about [facility]" [facility]. Pt states [facility] is [facility]. Advised pt of need for labs and cxr by Monday 10/17. Informed pt I will have orders and cath instructions at the front desk today. Pt states he will come by today for packet. Cath scheduled 10/20 at 8:30am

## 2015-07-17 NOTE — Patient Instructions (Signed)
Your physician has requested that you have a cardiac catheterization. Cardiac catheterization is used to diagnose and/or treat various heart conditions. Doctors may recommend this procedure for a number of different reasons. The most common reason is to evaluate chest pain. Chest pain can be a symptom of coronary artery disease (CAD), and cardiac catheterization can show whether plaque is narrowing or blocking your heart's arteries. This procedure is also used to evaluate the valves, as well as measure the blood flow and oxygen levels in different parts of your heart. For further information please visit [email or web address] Please follow instruction sheet, as given.  [facility] Cardiac Cath Instructions   You are scheduled for a Cardiac Cath on: Thursday, October 20, 8:30am  Please arrive at 7:30am on the day of your procedure  Do not eat/drink anything after midnight  Someone will need to drive you home  It is recommended someone be with you for the first 24 hours after your procedure  Wear clothes that are easy to get on/off and wear slip on shoes if possible   Medications bring a current list of all medications with you  ___ You may take all of your medications the morning of your procedure with enough water to swallow safely  __xx_ Do not take these medications the morning before your procedure: enalapril and metoprolol   Day of your procedure: [facility] at [facility] entrance.  Free valet service is available.  After entering the [facility] please check-in at the registration desk (1st desk on your right) to receive your armband. After receiving your armband someone will escort you to the cardiac cath/special procedures waiting area.  The usual length of stay after your procedure is about 2 to 3 hours.  This can vary.  If you have any questions, please call our office at [phone], or you may call the cardiac cath lab at [facility] directly at [phone] Angiogram An angiogram,  also called angiography, is a procedure used to look at the blood vessels. In this procedure, dye is injected through a long, thin tube (catheter) into an artery. X-rays are then taken. The X-rays will show if there is a blockage or problem in a blood vessel.  LET [facility] PROVIDER KNOW ABOUT:  Any allergies you have, including allergies to shellfish or contrast dye.   All medicines you are taking, including vitamins, herbs, eye drops, creams, and over-the-counter medicines.   Previous problems you or members of your family have had with the use of anesthetics.   Any blood disorders you have.   Previous surgeries you have had.  Any previous kidney problems or failure you have had.  Medical conditions you have.   Possibility of pregnancy, if this applies. RISKS AND COMPLICATIONS Generally, an angiogram is a safe procedure. However, as with any procedure, problems can occur. Possible problems include:  Injury to the blood vessels, including rupture or bleeding.  Infection or bruising at the catheter site.  Allergic reaction to the dye or contrast used.  Kidney damage from the dye or contrast used.  Blood clots that can lead to a stroke or heart attack. BEFORE THE PROCEDURE  Do not eat or drink after midnight on the night before the procedure, or as directed by your health care provider.   Ask your health care provider if you may drink enough water to take any needed medicines the morning of the procedure.  PROCEDURE  You may be given a medicine to help you relax (sedative) before and during  the procedure. This medicine is given through an IV access tube that is inserted into one of your veins.   The area where the catheter will be inserted will be washed and shaved. This is usually done in the groin but may be done in the fold of your arm (near your elbow) or in the wrist.  A medicine will be given to numb the area where the catheter will be inserted (local  anesthetic).  The catheter will be inserted with a guide wire into an artery. The catheter is guided by using a type of X-ray (fluoroscopy) to the blood vessel being examined.   Dye is then injected into the catheter, and X-rays are taken. The dye helps to show where any narrowing or blockages are located.  AFTER THE PROCEDURE   If the procedure is done through the leg, you will be kept in bed lying flat for several hours. You will be instructed to not bend or cross your legs.  The insertion site will be checked frequently.  The pulse in your feet or wrist will be checked frequently.  Additional blood tests, X-rays, and electrocardiography may be done.   You may need to stay in the hospital overnight for observation.    This information is not intended to replace advice given to you by your health care provider. Make sure you discuss any questions you have with your health care provider.   Document Released: 07/01/2005 Document Revised: 10/12/2014 Document Reviewed: 02/22/2013 Elsevier Interactive Patient Education 2016 Else[employer] Inc. Angiogram, Care After Refer to this sheet in the next few weeks. These instructions provide you with information about caring for yourself after your procedure. Your health care provider may also give you more specific instructions. Your treatment has been planned according to current medical practices, but problems sometimes occur. Call your health care provider if you have any problems or questions after your procedure. WHAT TO EXPECT AFTER THE PROCEDURE After your procedure, it is typical to have the following:  Bruising at the catheter insertion site that usually fades within 1-2 weeks.  Blood collecting in the tissue (hematoma) that may be painful to the touch. It should usually decrease in size and tenderness within 1-2 weeks. HOME CARE INSTRUCTIONS  Take medicines only as directed by your health care provider.  You may shower 24-48 hours  after the procedure or as directed by your health care provider. Remove the bandage (dressing) and gently wash the site with plain soap and water. Pat the area dry with a clean towel. Do not rub the site, because this may cause bleeding.  Do not take baths, swim, or use a hot tub until your health care provider approves.  Check your insertion site every day for redness, swelling, or drainage.  Do not apply powder or lotion to the site.  Do not lift over 10 lb (4.5 kg) for 5 days after your procedure or as directed by your health care provider.  Ask your health care provider when it is okay to:  Return to work or school.  Resume usual physical activities or sports.  Resume sexual activity.  Do not drive home if you are discharged the same day as the procedure. Have someone else drive you.  You may drive 24 hours after the procedure unless otherwise instructed by your health care provider.  Do not operate machinery or power tools for 24 hours after the procedure or as directed by your health care provider.  If your procedure was done as  an outpatient procedure, which means that you went home the same day as your procedure, a responsible adult should be with you for the first 24 hours after you arrive home.  Keep all follow-up visits as directed by your health care provider. This is important. SEEK MEDICAL CARE IF:  You have a fever.  You have chills.  You have increased bleeding from the catheter insertion site. Hold pressure on the site. SEEK IMMEDIATE MEDICAL CARE IF:  You have unusual pain at the catheter insertion site.  You have redness, warmth, or swelling at the catheter insertion site.  You have drainage (other than a small amount of blood on the dressing) from the catheter insertion site.  The catheter insertion site is bleeding, and the bleeding does not stop after 30 minutes of holding steady pressure on the site.  The area near or just beyond the catheter  insertion site becomes pale, cool, tingly, or numb.   This information is not intended to replace advice given to you by your health care provider. Make sure you discuss any questions you have with your health care provider.   Document Released: 04/09/2005 Document Revised: 10/12/2014 Document Reviewed: 02/22/2013 Elsevier Interactive Patient Education [employer]

## 2015-07-19 ENCOUNTER — Other Ambulatory Visit
Admission: RE | Admit: 2015-07-19 | Discharge: 2015-07-19 | Disposition: A | Discharge status: Home / Self Care | Payer: Medicare Other | Source: Ambulatory Visit | Attending: *Deleted | Admitting: *Deleted

## 2015-07-19 ENCOUNTER — Ambulatory Visit
Admission: RE | Admit: 2015-07-19 | Discharge: 2015-07-19 | Disposition: A | Discharge status: Home / Self Care | Payer: Medicare Other | Source: Ambulatory Visit | Attending: Cardiovascular Disease | Admitting: Cardiovascular Disease

## 2015-07-19 DIAGNOSIS — Z01812 Encounter for preprocedural laboratory examination: Secondary | ICD-10-CM

## 2015-07-19 DIAGNOSIS — Z01818 Encounter for other preprocedural examination: Secondary | ICD-10-CM | POA: Diagnosis present

## 2015-07-19 DIAGNOSIS — J439 Emphysema, unspecified: Secondary | ICD-10-CM | POA: Diagnosis not present

## 2015-07-19 LAB — BASIC METABOLIC PANEL
Anion gap: 7 (ref 5–15)
BUN: 8 mg/dL (ref 6–20)
CHLORIDE: 101 mmol/L (ref 101–111)
CO2: 29 mmol/L (ref 22–32)
CREATININE: 0.92 mg/dL (ref 0.61–1.24)
Calcium: 9.5 mg/dL (ref 8.9–10.3)
GFR calc non Af Amer: >60 mL/min (ref >60)
Glucose, Bld: 107 mg/dL — ABNORMAL HIGH (ref 65–99)
POTASSIUM: 5.1 mmol/L (ref 3.5–5.1)
SODIUM: 137 mmol/L (ref 135–145)

## 2015-07-19 LAB — CBC
HEMATOCRIT: 51.1 % (ref 40.0–52.0)
Hemoglobin: 17.2 g/dL (ref 13.0–18.0)
MCH: 31.7 pg (ref 26.0–34.0)
MCHC: 33.6 g/dL (ref 32.0–36.0)
MCV: 94.4 fL (ref 80.0–100.0)
PLATELETS: 182 10*3/uL (ref 150–440)
RBC: 5.41 MIL/uL (ref 4.40–5.90)
RDW: 13.4 % (ref 11.5–14.5)
WBC: 6.4 10*3/uL (ref 3.8–10.6)

## 2015-07-19 LAB — PROTIME-INR
INR: 1.05
Prothrombin Time: 13.9 seconds (ref 11.4–15.0)

## 2015-07-23 ENCOUNTER — Telehealth: Payer: Self-pay

## 2015-07-23 NOTE — Telephone Encounter (Signed)
Pt would like more details on stress test results. Please call.

## 2015-07-24 NOTE — Telephone Encounter (Signed)
S/w pt to answer questions regarding stress test results and upcoming cath. Inquired as to amount of time for procedure, if need for overnight stay at hospital. Informed pt that if stent is placed, he would be admitted for observation. Reviewed medications to hold, time, location. Pt verbalized understanding with no further questions.

## 2015-07-25 ENCOUNTER — Ambulatory Visit
Admission: RE | Admit: 2015-07-25 | Discharge: 2015-07-25 | Disposition: A | Discharge status: Home / Self Care | Payer: Medicare Other | Source: Ambulatory Visit | Attending: Cardiovascular Disease | Admitting: Cardiovascular Disease

## 2015-07-25 ENCOUNTER — Encounter: Payer: Self-pay | Admitting: *Deleted

## 2015-07-25 ENCOUNTER — Encounter
Admission: RE | Disposition: A | Discharge status: Home / Self Care | Payer: Self-pay | Source: Ambulatory Visit | Attending: Cardiovascular Disease

## 2015-07-25 DIAGNOSIS — E669 Obesity, unspecified: Secondary | ICD-10-CM | POA: Diagnosis not present

## 2015-07-25 DIAGNOSIS — Z955 Presence of coronary angioplasty implant and graft: Secondary | ICD-10-CM | POA: Diagnosis not present

## 2015-07-25 DIAGNOSIS — F17210 Nicotine dependence, cigarettes, uncomplicated: Secondary | ICD-10-CM | POA: Insufficient documentation

## 2015-07-25 DIAGNOSIS — E785 Hyperlipidemia, unspecified: Secondary | ICD-10-CM | POA: Diagnosis not present

## 2015-07-25 DIAGNOSIS — I25118 Atherosclerotic heart disease of native coronary artery with other forms of angina pectoris: Secondary | ICD-10-CM

## 2015-07-25 DIAGNOSIS — R0600 Dyspnea, unspecified: Secondary | ICD-10-CM | POA: Diagnosis not present

## 2015-07-25 DIAGNOSIS — I714 Abdominal aortic aneurysm, without rupture: Secondary | ICD-10-CM | POA: Insufficient documentation

## 2015-07-25 DIAGNOSIS — I2510 Atherosclerotic heart disease of native coronary artery without angina pectoris: Secondary | ICD-10-CM | POA: Diagnosis present

## 2015-07-25 DIAGNOSIS — Z79899 Other long term (current) drug therapy: Secondary | ICD-10-CM | POA: Insufficient documentation

## 2015-07-25 DIAGNOSIS — Z8249 Family history of ischemic heart disease and other diseases of the circulatory system: Secondary | ICD-10-CM | POA: Diagnosis not present

## 2015-07-25 DIAGNOSIS — Z7982 Long term (current) use of aspirin: Secondary | ICD-10-CM | POA: Insufficient documentation

## 2015-07-25 DIAGNOSIS — F1010 Alcohol abuse, uncomplicated: Secondary | ICD-10-CM | POA: Insufficient documentation

## 2015-07-25 DIAGNOSIS — I10 Essential (primary) hypertension: Secondary | ICD-10-CM | POA: Insufficient documentation

## 2015-07-25 DIAGNOSIS — Z87442 Personal history of urinary calculi: Secondary | ICD-10-CM | POA: Diagnosis not present

## 2015-07-25 DIAGNOSIS — J449 Chronic obstructive pulmonary disease, unspecified: Secondary | ICD-10-CM | POA: Insufficient documentation

## 2015-07-25 DIAGNOSIS — R600 Localized edema: Secondary | ICD-10-CM | POA: Diagnosis not present

## 2015-07-25 DIAGNOSIS — I252 Old myocardial infarction: Secondary | ICD-10-CM | POA: Diagnosis not present

## 2015-07-25 DIAGNOSIS — R9439 Abnormal result of other cardiovascular function study: Secondary | ICD-10-CM | POA: Insufficient documentation

## 2015-07-25 DIAGNOSIS — Z6827 Body mass index (BMI) 27.0-27.9, adult: Secondary | ICD-10-CM | POA: Insufficient documentation

## 2015-07-25 DIAGNOSIS — I25119 Atherosclerotic heart disease of native coronary artery with unspecified angina pectoris: Secondary | ICD-10-CM | POA: Insufficient documentation

## 2015-07-25 DIAGNOSIS — I259 Chronic ischemic heart disease, unspecified: Secondary | ICD-10-CM | POA: Insufficient documentation

## 2015-07-25 DIAGNOSIS — R931 Abnormal findings on diagnostic imaging of heart and coronary circulation: Secondary | ICD-10-CM

## 2015-07-25 HISTORY — PX: CARDIAC CATHETERIZATION: SHX172

## 2015-07-25 SURGERY — LEFT HEART CATH
Anesthesia: Moderate Sedation

## 2015-07-25 MED ORDER — SODIUM CHLORIDE 0.9 % IV SOLN
250.0000 mL | INTRAVENOUS | Status: DC | PRN
  Administered 2015-07-25: 1000 mL via INTRAVENOUS

## 2015-07-25 MED ORDER — MIDAZOLAM HCL 2 MG/2ML IJ SOLN
INTRAMUSCULAR | Status: DC | PRN
Start: 2015-07-25 — End: 2015-07-25
  Administered 2015-07-25 (×2): 1 mg via INTRAVENOUS

## 2015-07-25 MED ORDER — VERAPAMIL HCL 2.5 MG/ML IV SOLN
INTRAVENOUS | Status: DC | PRN
  Administered 2015-07-25: 5 mg via INTRA_ARTERIAL

## 2015-07-25 MED ORDER — SODIUM CHLORIDE 0.9 % IJ SOLN: 3.0000 mL | Freq: Two times a day (BID) | INTRAMUSCULAR | Status: DC

## 2015-07-25 MED ORDER — IOHEXOL 300 MG/ML  SOLN
INTRAMUSCULAR | Status: DC | PRN
  Administered 2015-07-25: 85 mL via INTRA_ARTERIAL

## 2015-07-25 MED ORDER — SODIUM CHLORIDE 0.9 % IV SOLN: 250.0000 mL | INTRAVENOUS | Status: DC | PRN

## 2015-07-25 MED ORDER — SODIUM CHLORIDE 0.9 % IJ SOLN: 3.0000 mL | INTRAMUSCULAR | Status: DC | PRN

## 2015-07-25 MED ORDER — HEPARIN SODIUM (PORCINE) 1000 UNIT/ML IJ SOLN
INTRAMUSCULAR | Status: AC
  Filled 2015-07-25: qty 1

## 2015-07-25 MED ORDER — SODIUM CHLORIDE 0.9 % WEIGHT BASED INFUSION: 1.0000 mL/kg/h | INTRAVENOUS | Status: DC

## 2015-07-25 MED ORDER — MIDAZOLAM HCL 2 MG/2ML IJ SOLN
INTRAMUSCULAR | Status: AC
  Filled 2015-07-25: qty 2

## 2015-07-25 MED ORDER — HEPARIN (PORCINE) IN NACL 2-0.9 UNIT/ML-% IJ SOLN
INTRAMUSCULAR | Status: AC
  Filled 2015-07-25: qty 1000

## 2015-07-25 MED ORDER — SODIUM CHLORIDE 0.9 % WEIGHT BASED INFUSION: 3.0000 mL/kg/h | INTRAVENOUS | Status: DC

## 2015-07-25 MED ORDER — HEPARIN SODIUM (PORCINE) 1000 UNIT/ML IJ SOLN
INTRAMUSCULAR | Status: DC | PRN
  Administered 2015-07-25: 5000 [IU] via INTRAVENOUS

## 2015-07-25 MED ORDER — VERAPAMIL HCL 2.5 MG/ML IV SOLN
INTRAVENOUS | Status: AC
  Filled 2015-07-25: qty 2

## 2015-07-25 MED ORDER — FENTANYL CITRATE (PF) 100 MCG/2ML IJ SOLN
INTRAMUSCULAR | Status: AC
  Filled 2015-07-25: qty 2

## 2015-07-25 MED ORDER — FENTANYL CITRATE (PF) 100 MCG/2ML IJ SOLN
INTRAMUSCULAR | Status: DC | PRN
  Administered 2015-07-25: 50 ug via INTRAVENOUS
  Administered 2015-07-25: 25 ug via INTRAVENOUS

## 2015-07-25 SURGICAL SUPPLY — 8 items
CATH INFINITI 5FR ANG PIGTAIL (CATHETERS) ×1 IMPLANT
CATH INFINITI JR4 5F (CATHETERS) ×1 IMPLANT
CATH OPTITORQUE JACKY 4.0 5F (CATHETERS) ×2 IMPLANT
DEVICE RAD TR BAND REGULAR (VASCULAR PRODUCTS) ×1 IMPLANT
GLIDESHEATH SLEND SS 6F .021 (SHEATH) ×2 IMPLANT
KIT MANI 3VAL PERCEP (MISCELLANEOUS) ×2 IMPLANT
PACK CARDIAC CATH (CUSTOM PROCEDURE TRAY) ×2 IMPLANT
WIRE SAFE-T 1.5MM-J .035X260CM (WIRE) ×2 IMPLANT

## 2015-07-25 NOTE — Progress Notes (Signed)
Patient is alert and oriented. Reporting no pain. VSS. Right wrist site WNL, redressed per orders. Patient has voided in urinal. Discharge to home with wife. Will escort out via wheelchair.

## 2015-07-25 NOTE — Interval H&P Note (Signed)
Cath Lab Visit (complete for each Cath Lab visit)  Clinical Evaluation Leading to the Procedure:   ACS: No.  Non-ACS:    Anginal Classification: CCS III  Anti-ischemic medical therapy: Minimal Therapy (1 class of medications)  Non-Invasive Test Results: Intermediate-risk stress test findings: cardiac mortality 1-3%/year  Prior CABG: No previous CABG      History and Physical Interval Note:  07/25/2015 9:33 AM  Bobby Sandoval  has presented today for surgery, with the diagnosis of Lt Heart Abn Myoview  The various methods of treatment have been discussed with the patient and family. After consideration of risks, benefits and other options for treatment, the patient has consented to  Procedure(s): Left Heart Cath (N/A) as a surgical intervention .  The patient's history has been reviewed, patient examined, no change in status, stable for surgery.  I have reviewed the patient's chart and labs.  Questions were answered to the patient's satisfaction.     Lorine BearsMuhammad Azula Zappia

## 2015-07-25 NOTE — H&P (View-Only) (Signed)
Primary care physician: Dr. Yates DecampJohn Sandoval  HPI  This is a 69 year old man who is here today for a follow-up visit regarding coronary artery disease.  He has known history of coronary artery disease with previous myocardial infarction in 2007. He was treated with bare-metal stent placement x2 to the mid RCA. He [facility]. He has chronic medical conditions that include hypertension, hyperlipidemia, tobacco use, abdominal aortic aneurysm , excessive alcohol use and COPD. most recent ultrasound showed the size to be 4 x 4 centimeters.  He reports worsening exertional dyspnea without chest discomfort. This has been more noticeable over the last 6 months. No orthopnea or PND. He also noted some leg edema worse on the right side. He denies claudication. Unfortunately, he continues to smoke 2 packs per day and drinks a 12 pack of beer daily.   No Known Allergies   Current Outpatient Prescriptions on File Prior to Visit  Medication Sig Dispense Refill  . ALPRAZolam (XANAX) 0.5 MG tablet Take 0.5 mg by mouth 3 (three) times daily as needed for anxiety.    Marland Kitchen. aspirin 325 MG tablet Take 325 mg by mouth daily.    Marland Kitchen. atorvastatin (LIPITOR) 80 MG tablet Take 0.5 tablets (40 mg total) by mouth daily. 30 tablet 6  . enalapril (VASOTEC) 20 MG tablet Take 1 tablet (20 mg total) by mouth 2 (two) times daily. 60 tablet 3  . metoprolol succinate (TOPROL-XL) 50 MG 24 hr tablet Take 1 tablet (50 mg total) by mouth daily. Take with or immediately following a meal. 30 tablet 6  . nitroGLYCERIN (NITROSTAT) 0.4 MG SL tablet Place 0.4 mg under the tongue every 5 (five) minutes as needed for chest pain.     No current facility-administered medications on file prior to visit.     Past Medical History  Diagnosis Date  . MI (myocardial infarction) (HCC)   . Panic disorder   . COPD (chronic obstructive pulmonary disease) (HCC)   . History of kidney stones     . Obesity   . Tobacco abuse   . AAA (abdominal aortic aneurysm) (HCC)   . Hyperlipidemia   . Hypertension   . AAA (abdominal aortic aneurysm) (HCC)     3.4 cm 2012  . ETOH abuse   . CAD (coronary artery disease)     Previous RCA stents x2 in 2007 4 inferior ST elevation MI and mid LAD in 2008     Past Surgical History  Procedure Laterality Date  . Microdiscectomy lumbar    . Cardiac catheterization  08/2006    x1 stent [facility]. Mid RCA: 3.5 x 16 mm overlap with 3.5 x 24 mm Liberte bare-metal stents  . Cardiac catheterization  10/2006    x1 stent [facility]: Mid LAD: 2.5 x 28 mm Cypher drug-eluting     Family History  Problem Relation Age of Onset  . Hypertension Mother   . Heart disease Father      Social History   Social History  . Marital Status: Married    Spouse Name: N/A  . Number of Children: N/A  . Years of Education: N/A   Occupational History  . Not on file.   Social History Main Topics  . Smoking status: Current Every Day Smoker -- 2.00 packs/day for 10 years    Types: Cigarettes  . Smokeless tobacco: Not on file  . Alcohol Use: Yes     Comment: daily  . Drug  Use: No  . Sexual Activity: Not on file   Other Topics Concern  . Not on file   Social History Narrative     ROS A 10 point review of system was performed. It is negative other than that mentioned in the history of present illness.   PHYSICAL EXAM   BP 110/62 mmHg  Pulse 55  Ht 6' (1.829 m)  Wt 205 lb 4 oz (93.101 kg)  BMI 27.83 kg/m2 Constitutional: He is oriented to person, place, and time. He appears well-developed and well-nourished. No distress.  HENT: No nasal discharge.  Head: Normocephalic and atraumatic.  Eyes: Pupils are equal and round.  No discharge. Neck: Normal range of motion. Neck supple. No JVD present. No thyromegaly present.  Cardiovascular: Normal rate, regular rhythm, normal heart sounds. Exam reveals no gallop and no friction rub. No murmur heard.  Pulmonary/Chest:  Effort normal and breath sounds normal. No stridor. No respiratory distress. He has no wheezes. He has no rales. He exhibits no tenderness.  Abdominal: Soft. Bowel sounds are normal. He exhibits no distension. There is no tenderness. There is no rebound and no guarding.  Musculoskeletal: Normal range of motion. He exhibits trace edema and no tenderness.  Neurological: He is alert and oriented to person, place, and time. Coordination normal.  Skin: Skin is warm and dry. No rash noted. He is not diaphoretic. No erythema. No pallor.  Psychiatric: He has a normal mood and affect. His behavior is normal. Judgment and thought content normal.       EKG: Sinus bradycardia with sinus arrhythmia.  ASSESSMENT AND PLAN

## 2015-07-25 NOTE — Discharge Instructions (Signed)

## 2015-08-23 ENCOUNTER — Ambulatory Visit (INDEPENDENT_AMBULATORY_CARE_PROVIDER_SITE_OTHER): Payer: Medicare Other | Admitting: Cardiovascular Disease

## 2015-08-23 ENCOUNTER — Encounter: Payer: Self-pay | Admitting: Cardiovascular Disease

## 2015-08-23 ENCOUNTER — Telehealth: Payer: Self-pay | Admitting: *Deleted

## 2015-08-23 VITALS — BP 130/72 | HR 83 | Ht 72.0 in | Wt 206.2 lb

## 2015-08-23 DIAGNOSIS — E785 Hyperlipidemia, unspecified: Secondary | ICD-10-CM

## 2015-08-23 DIAGNOSIS — R0602 Shortness of breath: Secondary | ICD-10-CM

## 2015-08-23 DIAGNOSIS — Z720 Tobacco use: Secondary | ICD-10-CM

## 2015-08-23 DIAGNOSIS — I714 Abdominal aortic aneurysm, without rupture, unspecified: Secondary | ICD-10-CM

## 2015-08-23 DIAGNOSIS — I25118 Atherosclerotic heart disease of native coronary artery with other forms of angina pectoris: Secondary | ICD-10-CM | POA: Diagnosis not present

## 2015-08-23 DIAGNOSIS — I10 Essential (primary) hypertension: Secondary | ICD-10-CM | POA: Diagnosis not present

## 2015-08-23 MED ORDER — METOPROLOL SUCCINATE ER 50 MG PO TB24: 50.0000 mg | ORAL_TABLET | Freq: Every day | ORAL | Status: DC

## 2015-08-23 MED ORDER — METOPROLOL SUCCINATE ER 25 MG PO TB24: 25.0000 mg | ORAL_TABLET | Freq: Every day | ORAL | Status: DC

## 2015-08-23 NOTE — Progress Notes (Signed)
Primary care physician: Dr. Yates DecampJohn Sandoval  HPI  This is a 69 year old man who is here today for a follow-up visit regarding coronary artery disease.  He has known history of coronary artery disease with previous myocardial infarction in 2007. He was treated with bare-metal stent placement x2 to the mid RCA. He [facility]. He has chronic medical conditions that include hypertension, hyperlipidemia, tobacco use, abdominal aortic aneurysm , excessive alcohol use and COPD. most recent ultrasound showed the size to be 4 x 4 centimeters.  Unfortunately, he continues to smoke 2 packs per day and drinks a 12 pack of beer daily.  He had recent worsening of exertional dyspnea.thus, I proceeded with cardiac catheterization last month which showed an occluded mid LAD before the previously placed stent with diffuse disease and right to left collaterals. The RCA stents were patent. Ejection fraction was 50-55% and left ventricular end-diastolic pressure was 18. His previous angina was manifested as substernal chest tightness. However, he currently only reports dyspnea.he has been taking metoprolol only as needed for palpitations.  No Known Allergies   Current Outpatient Prescriptions on File Prior to Visit  Medication Sig Dispense Refill  . ALPRAZolam (XANAX) 0.5 MG tablet Take 0.5 mg by mouth 3 (three) times daily as needed for anxiety.    Marland Kitchen. aspirin EC 81 MG tablet Take 1 tablet (81 mg total) by mouth daily. 90 tablet 3  . atorvastatin (LIPITOR) 80 MG tablet Take 0.5 tablets (40 mg total) by mouth daily. 30 tablet 6  . enalapril (VASOTEC) 20 MG tablet Take 1 tablet (20 mg total) by mouth 2 (two) times daily. 60 tablet 3  . metoprolol succinate (TOPROL-XL) 50 MG 24 hr tablet Take 1 tablet (50 mg total) by mouth daily. Take with or immediately following a meal. 30 tablet 6  . nitroGLYCERIN (NITROSTAT) 0.4 MG SL tablet Place 0.4 mg under the tongue  every 5 (five) minutes as needed for chest pain.     No current facility-administered medications on file prior to visit.     Past Medical History  Diagnosis Date  . MI (myocardial infarction) (HCC)   . Panic disorder   . COPD (chronic obstructive pulmonary disease) (HCC)   . History of kidney stones   . Obesity   . Tobacco abuse   . AAA (abdominal aortic aneurysm) (HCC)   . Hyperlipidemia   . Hypertension   . AAA (abdominal aortic aneurysm) (HCC)     3.4 cm 2012  . ETOH abuse   . CAD (coronary artery disease)     Previous RCA stents x2 in 2007 4 inferior ST elevation MI and mid LAD in 2008     Past Surgical History  Procedure Laterality Date  . Microdiscectomy lumbar    . Cardiac catheterization  08/2006    x1 stent [facility]. Mid RCA: 3.5 x 16 mm overlap with 3.5 x 24 mm Liberte bare-metal stents  . Cardiac catheterization  10/2006    [facility]1 [facility]: Mid LAD: 2.5 x 28 mm Cypher drug-eluting  . Cardiac catheterization N/A 07/25/2015    Procedure: Left Heart Cath and Coronary Angiography;  Surgeon: Iran OuchMuhammad A Romulo Okray, MD;  Location: [facility] INVASIVE CV LAB;  Service: Cardiovascular;  Laterality: N/A;     Family History  Problem Relation Age of Onset  . Hypertension Mother   . Heart disease Father      Social History   Social History  . Marital  Status: Married    Spouse Name: N/A  . Number of Children: N/A  . Years of Education: N/A   Occupational History  . Not on file.   Social History Main Topics  . Smoking status: Current Every Day Smoker -- 2.00 packs/day for 50 years    Types: Cigarettes  . Smokeless tobacco: Not on file  . Alcohol Use: 50.4 oz/week    84 Cans of beer per week     Comment: daily  . Drug Use: No  . Sexual Activity: Not on file   Other Topics Concern  . Not on file   Social History Narrative     ROS A 10 point review of system was performed. It is negative other than that mentioned in the history of present illness.   PHYSICAL  EXAM   BP 130/72 mmHg  Pulse 83  Ht 6' (1.829 m)  Wt 206 lb 4 oz (93.554 kg)  BMI 27.97 kg/m2 Constitutional: He is oriented to person, place, and time. He appears well-developed and well-nourished. No distress.  HENT: No nasal discharge.  Head: Normocephalic and atraumatic.  Eyes: Pupils are equal and round.  No discharge. Neck: Normal range of motion. Neck supple. No JVD present. No thyromegaly present.  Cardiovascular: Normal rate, regular rhythm, normal heart sounds. Exam reveals no gallop and no friction rub. No murmur heard.  Pulmonary/Chest: Effort normal and breath sounds normal. No stridor. No respiratory distress. He has no wheezes. He has no rales. He exhibits no tenderness.  Abdominal: Soft. Bowel sounds are normal. He exhibits no distension. There is no tenderness. There is no rebound and no guarding.  Musculoskeletal: Normal range of motion. He exhibits trace edema and no tenderness.  Neurological: He is alert and oriented to person, place, and time. Coordination normal.  Skin: Skin is warm and dry. No rash noted. He is not diaphoretic. No erythema. No pallor.  Psychiatric: He has a normal mood and affect. His behavior is normal. Judgment and thought content normal.  Right radial pulse is normal with no hematoma.     EKG: normal sinus rhythm with no significant ST or T wave changes.  ASSESSMENT AND PLAN

## 2015-08-23 NOTE — Patient Instructions (Signed)
Medication Instructions:  Your physician has recommended you make the following change in your medication:  DECREASE metoprolol to 1/2 tablet (25mg ) once per day   Labwork: none  Testing/Procedures: none  Follow-Up: Your physician recommends that you schedule a follow-up appointment in: 4 months with Dr. Kirke CorinArida.    Any Other Special Instructions Will Be Listed Below (If Applicable).     If you need a refill on your cardiac medications before your next appointment, please call your pharmacy.

## 2015-08-23 NOTE — Assessment & Plan Note (Signed)
I again discussed with him the importance of smoking cessation but reports inability to quit.

## 2015-08-23 NOTE — Telephone Encounter (Signed)
Called [facility] pharmacy back and spoke with Marchelle FolksAmanda, pharmacy tech, giving verbal order, per Dr. Kirke CorinArida last OV note 08/23/15 stated for pt to decrease Metoprolol 50 mg to pt taking Metoprolol 25 mg daily, ordered dispensing 90 tablets, with 3 refills. Marchelle FolksAmanda, pharmacy tech, verbalized understanding.

## 2015-08-23 NOTE — Assessment & Plan Note (Signed)
Recent cardiac catheterization showed chronic occlusion of the mid LAD with right-to-left collaterals. Most of his dyspnea seems to be related to lung disease and he is currently not having chest pain. Thus, I favor continued medical therapy and reserving CTO PCI for refractory symptoms. I instructed him to take Toprol 25 mg daily and not as needed.

## 2015-08-23 NOTE — Assessment & Plan Note (Signed)
Lab Results  Component Value Date   CHOL 168 03/19/2014   HDL 89 03/19/2014   LDLCALC 72 03/19/2014   TRIG 34 03/19/2014   CHOLHDL 1.9 03/19/2014   Continue treatment with atorvastatin.

## 2015-08-23 NOTE — Telephone Encounter (Signed)
Pharmacy calling  They got two metoprolol refills needs to know which one is right, Please advise

## 2015-08-23 NOTE — Assessment & Plan Note (Signed)
This was 4 x 4 centimeter on most recent evaluation. I recommend a follow-up abdominal aortic ultrasound in 6 months .

## 2015-10-30 ENCOUNTER — Other Ambulatory Visit: Payer: Self-pay

## 2015-10-30 MED ORDER — ENALAPRIL MALEATE 20 MG PO TABS: 20.0000 mg | ORAL_TABLET | Freq: Two times a day (BID) | ORAL | Status: DC

## 2015-10-30 NOTE — Telephone Encounter (Signed)
Refill sent for enalapril 20 mg  

## 2015-10-31 ENCOUNTER — Other Ambulatory Visit: Payer: Self-pay | Admitting: *Deleted

## 2015-10-31 MED ORDER — ATORVASTATIN CALCIUM 80 MG PO TABS: 40.0000 mg | ORAL_TABLET | Freq: Every day | ORAL | Status: DC

## 2015-11-28 ENCOUNTER — Ambulatory Visit: Payer: Medicare Other

## 2015-11-28 DIAGNOSIS — I714 Abdominal aortic aneurysm, without rupture, unspecified: Secondary | ICD-10-CM

## 2015-12-02 ENCOUNTER — Other Ambulatory Visit: Payer: Self-pay

## 2015-12-02 DIAGNOSIS — I714 Abdominal aortic aneurysm, without rupture, unspecified: Secondary | ICD-10-CM

## 2015-12-23 ENCOUNTER — Encounter (INDEPENDENT_AMBULATORY_CARE_PROVIDER_SITE_OTHER): Payer: Self-pay

## 2015-12-23 ENCOUNTER — Ambulatory Visit (INDEPENDENT_AMBULATORY_CARE_PROVIDER_SITE_OTHER): Payer: Medicare Other | Admitting: Cardiovascular Disease

## 2015-12-23 ENCOUNTER — Encounter: Payer: Self-pay | Admitting: Cardiovascular Disease

## 2015-12-23 VITALS — BP 120/62 | HR 83 | Ht 72.0 in | Wt 203.0 lb

## 2015-12-23 DIAGNOSIS — I25118 Atherosclerotic heart disease of native coronary artery with other forms of angina pectoris: Secondary | ICD-10-CM | POA: Diagnosis not present

## 2015-12-23 DIAGNOSIS — I714 Abdominal aortic aneurysm, without rupture, unspecified: Secondary | ICD-10-CM

## 2015-12-23 DIAGNOSIS — E785 Hyperlipidemia, unspecified: Secondary | ICD-10-CM

## 2015-12-23 MED ORDER — ATORVASTATIN CALCIUM 40 MG PO TABS: 40.0000 mg | ORAL_TABLET | Freq: Every day | ORAL | Status: DC

## 2015-12-23 NOTE — Progress Notes (Signed)
Cardiology Office Note   Date:  12/23/2015   ID:  Bobby Sandoval, Bobby Sandoval 1946-01-10, MRN [id number]  PCP:  Rafael Bihari, MD  Cardiologist:   Lorine Bears, MD   Chief Complaint  Patient presents with  . other    4 month follow up. Meds reviewed by the patient verbally. "doing well."       History of Present Illness: Bobby Sandoval is a 70 y.o. male who presents for a follow-up visit regarding coronary artery disease.  He has known history of coronary artery disease with previous myocardial infarction in 2007. He was treated with bare-metal stent placement x 2 to the mid RCA. He subsequently underwent PCI to the mid LAD at the [facility] with drug-eluting stent. Most recent cardiac catheterization in October 2016 showed an occluded mid LAD before the previously placed stent with diffuse disease and right to left collaterals. The RCA stents were patent. Ejection fraction was 50-55% and left ventricular end-diastolic pressure was 18. He was treated medically.   He has chronic medical conditions that include hypertension, hyperlipidemia, tobacco use, abdominal aortic aneurysm , excessive alcohol use and COPD. Most recent ultrasound showed the size of AAA to be 4.5 x 4.4 centimeters.  Unfortunately, he continues to smoke 2 packs per day and drinks a 12 pack of beer daily.  He reports no chest pain. He continues to have stable exertional dyspnea.   Past Medical History  Diagnosis Date  . MI (myocardial infarction) (HCC)   . Panic disorder   . COPD (chronic obstructive pulmonary disease) (HCC)   . History of kidney stones   . Obesity   . Tobacco abuse   . AAA (abdominal aortic aneurysm) (HCC)   . Hyperlipidemia   . Hypertension   . AAA (abdominal aortic aneurysm) (HCC)     3.4 cm 2012  . ETOH abuse   . CAD (coronary artery disease)     Previous RCA stents x2 in 2007 4 inferior ST elevation MI and mid LAD in 2008    Past Surgical History  Procedure Laterality Date  .  Microdiscectomy lumbar    . Cardiac catheterization  08/2006    x1 stent [facility]. Mid RCA: 3.5 x 16 mm overlap with 3.5 x 24 mm Liberte bare-metal stents  . Cardiac catheterization  10/2006    x1 stent [facility]: Mid LAD: 2.5 x 28 mm Cypher drug-eluting  . Cardiac catheterization N/A 07/25/2015    Procedure: Left Heart Cath and Coronary Angiography;  Surgeon: Iran Ouch, MD;  Location: [facility] INVASIVE CV LAB;  Service: Cardiovascular;  Laterality: N/A;     Current Outpatient Prescriptions  Medication Sig Dispense Refill  . ALPRAZolam (XANAX) 0.5 MG tablet Take 0.5 mg by mouth 3 (three) times daily as needed for anxiety.    Marland Kitchen aspirin EC 81 MG tablet Take 1 tablet (81 mg total) by mouth daily. 90 tablet 3  . enalapril (VASOTEC) 20 MG tablet Take 1 tablet (20 mg total) by mouth 2 (two) times daily. 60 tablet 3  . metoprolol succinate (TOPROL XL) 25 MG 24 hr tablet Take 1 tablet (25 mg total) by mouth daily. 90 tablet 3  . nitroGLYCERIN (NITROSTAT) 0.4 MG SL tablet Place 0.4 mg under the tongue every 5 (five) minutes as needed for chest pain.    Marland Kitchen atorvastatin (LIPITOR) 40 MG tablet Take 1 tablet (40 mg total) by mouth daily. 30 tablet 6   No current facility-administered medications for this visit.  Allergies:   Review of patient's allergies indicates no known allergies.    Social History:  The patient  reports that he has been smoking Cigarettes.  He has a 100 pack-year smoking history. He does not have any smokeless tobacco history on file. He reports that he drinks about 50.4 oz of alcohol per week. He reports that he does not use illicit drugs.   Family History:  The patient's family history includes Heart disease in his father; Hypertension in his mother.    ROS:  Please see the history of present illness.   Otherwise, review of systems are positive for none.   All other systems are reviewed and negative.    PHYSICAL EXAM: VS:  BP 120/62 mmHg  Pulse 83  Ht 6' (1.829 m)  Wt 203 lb  (92.08 kg)  BMI 27.53 kg/m2 , BMI Body mass index is 27.53 kg/(m^2). GEN: Well nourished, well developed, in no acute distress HEENT: normal Neck: no JVD, carotid bruits, or masses Cardiac: RRR; no murmurs, rubs, or gallops,no edema  Respiratory:  clear to auscultation bilaterally, normal work of breathing GI: soft, nontender, nondistended, + BS MS: no deformity or atrophy Skin: warm and dry, no rash Neuro:  Strength and sensation are intact Psych: euthymic mood, full affect   EKG:  EKG is not ordered today.    Recent Labs: 07/19/2015: BUN 8; Creatinine, Ser 0.92; Hemoglobin 17.2; Platelets 182; Potassium 5.1; Sodium 137    Lipid Panel    Component Value Date/Time   CHOL 168 03/19/2014 0810   TRIG 34 03/19/2014 0810   HDL 89 03/19/2014 0810   CHOLHDL 1.9 03/19/2014 0810   LDLCALC 72 03/19/2014 0810      Wt Readings from Last 3 Encounters:  12/23/15 203 lb (92.08 kg)  08/23/15 206 lb 4 oz (93.554 kg)  07/25/15 205 lb (92.987 kg)         ASSESSMENT AND PLAN:  1.  Coronary artery disease involving native coronary arteries with stable angina: Most recent cardiac catheterization in October showed an occluded mid LAD with right-to-left collaterals. He continues to have stable exertional dyspnea with no chest pain. He reports improvement in symptoms since he started taking Toprol on a regular basis. I recommend continuing medical therapy.  2. Abdominal aortic aneurysm: Most recent ultrasound showed mild progression to 4.5 x 4.4 centimeter. Repeat abdominal aortic ultrasound in 6 months.  3. Hyperlipidemia: Continue treatment with atorvastatin with a target LDL of less than 70.  4. Tobacco use: Unfortunately, he reports inability to quit at the present time.  5. Excessive alcohol use: He continues to drink a 12 pack of beer daily and has not been able to cut down.     Disposition:   FU with me in 6 months  Signed,  Lorine Bears, MD  12/23/2015 10:41 AM    [facility]

## 2015-12-23 NOTE — Patient Instructions (Signed)
Medication Instructions:  Your physician has recommended you make the following change in your medication:  DECREASE atorvastatin to 40mg  daily   Labwork: none  Testing/Procedures: none  Follow-Up: Your physician wants you to follow-up in: six months with Dr. Kirke CorinArida.  You will receive a reminder letter in the mail two months in advance. If you don't receive a letter, please call our office to schedule the follow-up appointment.  Any Other Special Instructions Will Be Listed Below (If Applicable).     If you need a refill on your cardiac medications before your next appointment, please call your pharmacy.

## 2016-01-23 ENCOUNTER — Telehealth: Payer: Self-pay | Admitting: Cardiovascular Disease

## 2016-01-23 ENCOUNTER — Emergency Department
Admission: EM | Admit: 2016-01-23 | Discharge: 2016-01-23 | Disposition: A | Discharge status: Home / Self Care | Payer: Medicare Other | Attending: Emergency Medicine | Admitting: Emergency Medicine

## 2016-01-23 ENCOUNTER — Encounter: Payer: Self-pay | Admitting: Emergency Medicine

## 2016-01-23 DIAGNOSIS — Z7982 Long term (current) use of aspirin: Secondary | ICD-10-CM | POA: Diagnosis not present

## 2016-01-23 DIAGNOSIS — Z79899 Other long term (current) drug therapy: Secondary | ICD-10-CM | POA: Diagnosis not present

## 2016-01-23 DIAGNOSIS — E785 Hyperlipidemia, unspecified: Secondary | ICD-10-CM | POA: Insufficient documentation

## 2016-01-23 DIAGNOSIS — I10 Essential (primary) hypertension: Secondary | ICD-10-CM | POA: Insufficient documentation

## 2016-01-23 DIAGNOSIS — F17210 Nicotine dependence, cigarettes, uncomplicated: Secondary | ICD-10-CM | POA: Insufficient documentation

## 2016-01-23 DIAGNOSIS — E669 Obesity, unspecified: Secondary | ICD-10-CM | POA: Insufficient documentation

## 2016-01-23 DIAGNOSIS — I2510 Atherosclerotic heart disease of native coronary artery without angina pectoris: Secondary | ICD-10-CM | POA: Insufficient documentation

## 2016-01-23 DIAGNOSIS — J449 Chronic obstructive pulmonary disease, unspecified: Secondary | ICD-10-CM | POA: Diagnosis not present

## 2016-01-23 DIAGNOSIS — E860 Dehydration: Secondary | ICD-10-CM | POA: Diagnosis not present

## 2016-01-23 DIAGNOSIS — R42 Dizziness and giddiness: Secondary | ICD-10-CM | POA: Insufficient documentation

## 2016-01-23 DIAGNOSIS — R1031 Right lower quadrant pain: Secondary | ICD-10-CM | POA: Diagnosis present

## 2016-01-23 DIAGNOSIS — I252 Old myocardial infarction: Secondary | ICD-10-CM | POA: Insufficient documentation

## 2016-01-23 DIAGNOSIS — Z8679 Personal history of other diseases of the circulatory system: Secondary | ICD-10-CM | POA: Diagnosis not present

## 2016-01-23 LAB — CBC
HEMATOCRIT: 45.4 % (ref 40.0–52.0)
HEMOGLOBIN: 15.4 g/dL (ref 13.0–18.0)
MCH: 32 pg (ref 26.0–34.0)
MCHC: 33.9 g/dL (ref 32.0–36.0)
MCV: 94.4 fL (ref 80.0–100.0)
Platelets: 159 10*3/uL (ref 150–440)
RBC: 4.8 MIL/uL (ref 4.40–5.90)
RDW: 14.3 % (ref 11.5–14.5)
WBC: 6.2 10*3/uL (ref 3.8–10.6)

## 2016-01-23 LAB — COMPREHENSIVE METABOLIC PANEL
ALBUMIN: 4.4 g/dL (ref 3.5–5.0)
ALK PHOS: 66 U/L (ref 38–126)
ALT: 32 U/L (ref 17–63)
ANION GAP: 6 (ref 5–15)
AST: 34 U/L (ref 15–41)
BUN: 9 mg/dL (ref 6–20)
CALCIUM: 9.2 mg/dL (ref 8.9–10.3)
CHLORIDE: 102 mmol/L (ref 101–111)
CO2: 28 mmol/L (ref 22–32)
Creatinine, Ser: 0.96 mg/dL (ref 0.61–1.24)
GFR calc non Af Amer: >60 mL/min (ref >60)
GLUCOSE: 111 mg/dL — AB (ref 65–99)
POTASSIUM: 4.4 mmol/L (ref 3.5–5.1)
SODIUM: 136 mmol/L (ref 135–145)
Total Bilirubin: 1.2 mg/dL (ref 0.3–1.2)
Total Protein: 7 g/dL (ref 6.5–8.1)

## 2016-01-23 LAB — URINALYSIS COMPLETE WITH MICROSCOPIC (ARMC ONLY)
BILIRUBIN URINE: NEGATIVE
Bacteria, UA: NONE SEEN
GLUCOSE, UA: NEGATIVE mg/dL
HGB URINE DIPSTICK: NEGATIVE
Nitrite: NEGATIVE
PH: 6 (ref 5.0–8.0)
Protein, ur: NEGATIVE mg/dL
SPECIFIC GRAVITY, URINE: 1.02 (ref 1.005–1.030)

## 2016-01-23 LAB — TROPONIN I

## 2016-01-23 LAB — LIPASE, BLOOD: LIPASE: 20 U/L (ref 11–51)

## 2016-01-23 NOTE — ED Notes (Signed)
Labs and urine sent to lab.

## 2016-01-23 NOTE — Discharge Instructions (Signed)
Abdominal Pain, Adult Many things can cause abdominal pain. Usually, abdominal pain is not caused by a disease and will improve without treatment. It can often be observed and treated at home. Your health care provider will do a physical exam and possibly order blood tests and X-rays to help determine the seriousness of your pain. However, in many cases, more time must pass before a clear cause of the pain can be found. Before that point, your health care provider may not know if you need more testing or further treatment. HOME CARE INSTRUCTIONS Monitor your abdominal pain for any changes. The following actions may help to alleviate any discomfort you are experiencing:  Only take over-the-counter or prescription medicines as directed by your health care provider.  Do not take laxatives unless directed to do so by your health care provider.  Try a clear liquid diet (broth, tea, or water) as directed by your health care provider. Slowly move to a bland diet as tolerated. SEEK MEDICAL CARE IF:  You have unexplained abdominal pain.  You have abdominal pain associated with nausea or diarrhea.  You have pain when you urinate or have a bowel movement.  You experience abdominal pain that wakes you in the night.  You have abdominal pain that is worsened or improved by eating food.  You have abdominal pain that is worsened with eating fatty foods.  You have a fever. SEEK IMMEDIATE MEDICAL CARE IF:  Your pain does not go away within 2 hours.  You keep throwing up (vomiting).  Your pain is felt only in portions of the abdomen, such as the right side or the left lower portion of the abdomen.  You pass bloody or black tarry stools. MAKE SURE YOU:  Understand these instructions.  Will watch your condition.  Will get help right away if you are not doing well or get worse.   This information is not intended to replace advice given to you by your health care provider. Make sure you discuss  any questions you have with your health care provider.   Document Released: 07/01/2005 Document Revised: 06/12/2015 Document Reviewed: 05/31/2013 Elsevier Interactive Patient Education 2016 Elsevier Inc.  Dehydration, Adult Dehydration means your body does not have as much fluid or water as it needs. It happens when you take in less fluid than you lose. Your kidneys, brain, and heart will not work properly without the right amount of fluids.  Dehydration can range from mild to severe. It should be treated right away to help prevent it from becoming severe. HOME CARE  Drink enough fluid to keep your pee (urine) clear or pale yellow.  Drink water or fluid slowly by taking small sips. You can also try sucking on ice cubes.  Have food or drinks that contain electrolytes. Examples include bananas and sports drinks.  Take over-the-counter and prescription medicines only as told by your doctor.  Prepare oral rehydration solution (ORS) according to the instructions that came with it. Take sips of ORS every 5 minutes until your pee returns to normal.  If you are throwing up (vomiting) or have watery poop (diarrhea), keep trying to drink water, ORS, or both.  If you have watery poop, avoid:  Drinks with caffeine.  Fruit juice.  Milk.  Carbonated soft drinks.  Do not take salt tablets. This can lead to having too much sodium in your body (hypernatremia). GET HELP IF:  You cannot eat or drink without throwing up.  You have had mild watery poop for  longer than 24 hours.  You have a fever. GET HELP RIGHT AWAY IF:   You have very strong thirst.  You have very bad watery poop.  You have not peed in 6-8 hours, or you have peed only a small amount of very dark pee.  You have shriveled skin.  You are dizzy, confused, or both.   This information is not intended to replace advice given to you by your health care provider. Make sure you discuss any questions you have with your health  care provider.   Document Released: 07/18/2009 Document Revised: 06/12/2015 Document Reviewed: 02/06/2015 Elsevier Interactive Patient Education [employer] Elsevier Inc.

## 2016-01-23 NOTE — Telephone Encounter (Signed)
Spoke w/ pt's wife.  She reports that EMS is there now and pt is refusing transport.  After discussion, she is agreeable to speaking w/ husband and having [facility].  Asked her to call back if we can be of further assistance.

## 2016-01-23 NOTE — Telephone Encounter (Signed)
Pt was at [facility] visiting a patient there Having Active chest pain Called 911, they are with patient. Does not want to go ED Pt c/o of Chest Pain: STAT if CP now or developed within 24 hours  1. Are you having CP right now? Right now he is feeling Upper right abdomen pain, moving from one side to the other.  2. Are you experiencing any other symptoms (ex. SOB, nausea, vomiting, sweating)? Not having anymore  3. How long have you been experiencing CP? Today   4. Is your CP continuous or coming and going? They were continuous   5. Have you taken Nitroglycerin? He didn't

## 2016-01-23 NOTE — ED Provider Notes (Signed)
[facility] Emergency Department Provider Note  ____________________________________________  Time seen: 1:00 PM  I have reviewed the triage vital signs and the nursing notes.   HISTORY  Chief Complaint Abdominal Pain    HPI Bobby Sandoval is a 70 y.o. male who complains of right lower quadrant abdominal pain that happened this morning at about 10 AM. He was sitting in his car, when he fell, restless so he got up. Immediately after getting up he felt somewhat lightheaded and that his vision darkened. He did not pass out or fall. He had some right lower quadrant abdominal pain without that was aching, nonradiating, moderate intensity, lasted for 15 minutes and then resolve spontaneously. He's not had any recurrence of the symptoms since then. He thinks it might be related to his history of panic attacks. No flank pain or back pain or belly pain or vomiting. No hematuria.     Past Medical History  Diagnosis Date  . MI (myocardial infarction) (HCC)   . Panic disorder   . COPD (chronic obstructive pulmonary disease) (HCC)   . History of kidney stones   . Obesity   . Tobacco abuse   . AAA (abdominal aortic aneurysm) (HCC)   . Hyperlipidemia   . Hypertension   . AAA (abdominal aortic aneurysm) (HCC)     3.4 cm 2012  . ETOH abuse   . CAD (coronary artery disease)     Previous RCA stents x2 in 2007 4 inferior ST elevation MI and mid LAD in 2008     Patient Active Problem List   Diagnosis Date Noted  . Coronary artery disease involving native coronary artery   . Abnormal nuclear stress test   . AAA (abdominal aortic aneurysm) (HCC) 02/09/2014  . CAD (coronary artery disease)   . Hyperlipidemia   . Hypertension   . Tobacco abuse      Past Surgical History  Procedure Laterality Date  . Microdiscectomy lumbar    . Cardiac catheterization  08/2006    x1 stent [facility]. Mid RCA: 3.5 x 16 mm overlap with 3.5 x 24 mm Liberte bare-metal stents  . Cardiac  catheterization  10/2006    x1 stent [facility]: Mid LAD: 2.5 x 28 mm Cypher drug-eluting  . Cardiac catheterization N/A 07/25/2015    Procedure: Left Heart Cath and Coronary Angiography;  Surgeon: Iran Ouch, MD;  Location: [facility] INVASIVE CV LAB;  Service: Cardiovascular;  Laterality: N/A;     Current Outpatient Rx  Name  Route  Sig  Dispense  Refill  . ALPRAZolam (XANAX) 0.5 MG tablet   Oral   Take 0.5 mg by mouth 3 (three) times daily as needed for anxiety.         Marland Kitchen aspirin EC 81 MG tablet   Oral   Take 1 tablet (81 mg total) by mouth daily.   90 tablet   3   . atorvastatin (LIPITOR) 40 MG tablet   Oral   Take 1 tablet (40 mg total) by mouth daily.   30 tablet   6   . enalapril (VASOTEC) 20 MG tablet   Oral   Take 1 tablet (20 mg total) by mouth 2 (two) times daily.   60 tablet   3   . metoprolol succinate (TOPROL XL) 25 MG 24 hr tablet   Oral   Take 1 tablet (25 mg total) by mouth daily.   90 tablet   3   . nitroGLYCERIN (NITROSTAT) 0.4 MG SL tablet  Sublingual   Place 0.4 mg under the tongue every 5 (five) minutes as needed for chest pain.            Allergies Review of patient's allergies indicates no known allergies.   Family History  Problem Relation Age of Onset  . Hypertension Mother   . Heart disease Father     Social History Social History  Substance Use Topics  . Smoking status: Current Every Day Smoker -- 2.00 packs/day for 50 years    Types: Cigarettes  . Smokeless tobacco: None  . Alcohol Use: 50.4 oz/week    84 Cans of beer per week     Comment: daily    Review of Systems  Constitutional:   No fever or chills.  Eyes:   No vision changes.  ENT:   No sore throat. No rhinorrhea. Cardiovascular:   No chest pain. Respiratory:   No dyspnea or cough. Gastrointestinal:   Right lower quadrant abdominal pain as above without vomiting and diarrhea.  No bloody stool. Genitourinary:   Negative for dysuria or difficulty  urinating. Musculoskeletal:   Negative for focal pain or swelling Neurological:   Negative for headaches 10-point ROS otherwise negative.  ____________________________________________   PHYSICAL EXAM:  VITAL SIGNS: ED Triage Vitals  Enc Vitals Group     BP 01/23/16 1121 150/67 mmHg     Pulse Rate 01/23/16 1121 58     Resp 01/23/16 1121 18     Temp 01/23/16 1121 98.5 F (36.9 C)     Temp Source 01/23/16 1121 Oral     SpO2 01/23/16 1121 98 %     Weight 01/23/16 1121 203 lb (92.08 kg)     Height 01/23/16 1121 6' (1.829 m)     Head Cir --      Peak Flow --      Pain Score 01/23/16 1122 0     Pain Loc --      Pain Edu? --      Excl. in GC? --     Vital signs reviewed, nursing assessments reviewed.   Constitutional:   Alert and oriented. Well appearing and in no distress. Eyes:   No scleral icterus. No conjunctival pallor. PERRL. EOMI ENT   Head:   Normocephalic and atraumatic.   Nose:   No congestion/rhinnorhea. No septal hematoma   Mouth/Throat:   MMM, no pharyngeal erythema. No peritonsillar mass.    Neck:   No stridor. No SubQ emphysema. No meningismus. Hematological/Lymphatic/Immunilogical:   No cervical lymphadenopathy. Cardiovascular:   RRR. Symmetric bilateral radial and DP pulses.  No murmurs.  Respiratory:   Normal respiratory effort without tachypnea nor retractions. Breath sounds are clear and equal bilaterally. No wheezes/rales/rhonchi. Gastrointestinal:   Soft and nontender. Non distended. There is no CVA tenderness.  No rebound, rigidity, or guarding. Genitourinary:   deferred Musculoskeletal:   Nontender with normal range of motion in all extremities. No joint effusions.  No lower extremity tenderness.  No edema. No pain with leg raise and hip flexion Neurologic:   Normal speech and language.  CN 2-10 normal. Motor grossly intact. No gross focal neurologic deficits are appreciated.  Skin:    Skin is warm, dry and intact. No rash noted.  No  petechiae, purpura, or bullae.  ____________________________________________    LABS (pertinent positives/negatives) (all labs ordered are listed, but only abnormal results are displayed) Labs Reviewed  COMPREHENSIVE METABOLIC PANEL - Abnormal; Notable for the following:    Glucose, Bld 111 (*)  All other components within normal limits  URINALYSIS COMPLETEWITH MICROSCOPIC ([facility] ONLY) - Abnormal; Notable for the following:    Color, Urine AMBER (*)    APPearance CLEAR (*)    Ketones, ur TRACE (*)    Leukocytes, UA TRACE (*)    Squamous Epithelial / LPF 0-5 (*)    All other components within normal limits  LIPASE, BLOOD  CBC  TROPONIN I   ____________________________________________   EKG  Interpreted by me Sinus bradycardia rate of 57, normal axis and intervals, poor R wave progression in anterior precordial leads. Normal ST segments and T waves.  ____________________________________________    RADIOLOGY    ____________________________________________   PROCEDURES   ____________________________________________   INITIAL IMPRESSION / ASSESSMENT AND PLAN / ED COURSE  Pertinent labs & imaging results that were available during my care of the patient were reviewed by me and considered in my medical decision making (see chart for details).  Patient well appearing no acute distress. Vital signs are unremarkable. Had a brief episode of right lower quadrant abdominal pain with spontaneous really resolved. He is now asymptomatic for several hours.Considering the patient's symptoms, medical history, and physical examination today, I have low suspicion for cholecystitis or biliary pathology, pancreatitis, perforation or bowel obstruction, hernia, intra-abdominal abscess, AAA worsening or dissection, volvulus or intussusception, mesenteric ischemia, or appendicitis.  Despite his history of AAA, he reports that it is under surveillance by his primary care doctor and has not  significantly changed. He has good pulses and perfusion of the lower extremities, and I expect him to have ongoing back pain flank pain or abdominal pain if he was having a complication of his AAA at this point. Counseled him to decrease his alcohol intake and increase his water intake. He'll follow up with primary care. Wife at bedside during discussion. Patient and family are agreeable to follow-up.     ____________________________________________   FINAL CLINICAL IMPRESSION(S) / ED DIAGNOSES  Final diagnoses:  RLQ abdominal pain  Dizziness  Mild dehydration       Portions of this note were generated with dragon dictation software. Dictation errors may occur despite best attempts at proofreading.   Sharman Cheek, MD 01/23/16 [phone]

## 2016-01-23 NOTE — ED Notes (Signed)
Pt here with c/o RUQ pain that began this am after visiting a friend at assisted living,  Pain radiating to mid upper abd, states it is a "cramping" pain, does have his gallbladder. States EMS was called to check pt, and advised him to come here to get blood work done. Pt appears in no distress at this time.

## 2016-05-29 DIAGNOSIS — C44319 Basal cell carcinoma of skin of other parts of face: Secondary | ICD-10-CM | POA: Diagnosis not present

## 2016-05-29 DIAGNOSIS — L905 Scar conditions and fibrosis of skin: Secondary | ICD-10-CM | POA: Diagnosis not present

## 2016-06-05 DIAGNOSIS — D0439 Carcinoma in situ of skin of other parts of face: Secondary | ICD-10-CM | POA: Diagnosis not present

## 2016-06-05 DIAGNOSIS — L905 Scar conditions and fibrosis of skin: Secondary | ICD-10-CM | POA: Diagnosis not present

## 2016-06-09 ENCOUNTER — Ambulatory Visit: Payer: Medicare Other

## 2016-06-09 DIAGNOSIS — I714 Abdominal aortic aneurysm, without rupture, unspecified: Secondary | ICD-10-CM

## 2016-06-10 ENCOUNTER — Other Ambulatory Visit: Payer: Self-pay

## 2016-06-10 DIAGNOSIS — I714 Abdominal aortic aneurysm, without rupture, unspecified: Secondary | ICD-10-CM

## 2016-06-23 ENCOUNTER — Ambulatory Visit: Payer: Medicare Other | Admitting: Cardiovascular Disease

## 2016-06-25 ENCOUNTER — Ambulatory Visit (INDEPENDENT_AMBULATORY_CARE_PROVIDER_SITE_OTHER): Payer: Medicare Other | Admitting: Cardiovascular Disease

## 2016-06-25 ENCOUNTER — Encounter: Payer: Self-pay | Admitting: Cardiovascular Disease

## 2016-06-25 VITALS — BP 130/62 | HR 54 | Ht 72.0 in | Wt 206.5 lb

## 2016-06-25 DIAGNOSIS — I25118 Atherosclerotic heart disease of native coronary artery with other forms of angina pectoris: Secondary | ICD-10-CM

## 2016-06-25 DIAGNOSIS — I714 Abdominal aortic aneurysm, without rupture, unspecified: Secondary | ICD-10-CM

## 2016-06-25 DIAGNOSIS — I10 Essential (primary) hypertension: Secondary | ICD-10-CM | POA: Diagnosis not present

## 2016-06-25 DIAGNOSIS — Z720 Tobacco use: Secondary | ICD-10-CM

## 2016-06-25 DIAGNOSIS — E785 Hyperlipidemia, unspecified: Secondary | ICD-10-CM

## 2016-06-25 NOTE — Patient Instructions (Signed)
Medication Instructions:  Your physician recommends that you continue on your current medications as directed. Please refer to the Current Medication list given to you today.   Labwork: none  Testing/Procedures: Your physician has requested that you have an abdominal aorta duplex in March. During this test, an ultrasound is used to evaluate the aorta. Allow 30 minutes for this exam. Do not eat after midnight the day before and avoid carbonated beverages   Follow-Up: Your physician wants you to follow-up in: 6 months with Dr. Kirke CorinArida.  You will receive a reminder letter in the mail two months in advance. If you don't receive a letter, please call our office to schedule the follow-up appointment.   Any Other Special Instructions Will Be Listed Below (If Applicable).     If you need a refill on your cardiac medications before your next appointment, please call your pharmacy.

## 2016-06-25 NOTE — Progress Notes (Signed)
Cardiology Office Note   Date:  06/25/2016   ID:  Bobby Sandoval, Bobby Sandoval 1946-02-17, MRN [id number]  PCP:  Rafael Bihari, MD  Cardiologist:   Lorine Bears, MD   Chief Complaint  Patient presents with  . other    6 month f/u. Meds reviewed verbally with pt.      History of Present Illness: Bobby Sandoval is a 70 y.o. male who presents for a follow-up visit regarding coronary artery disease.  He has known history of coronary artery disease with previous myocardial infarction in 2007. He was treated with bare-metal stent placement x 2 to the mid RCA. He subsequently underwent PCI to the mid LAD at the [facility] with drug-eluting stent. Most recent cardiac catheterization in October 2016 showed an occluded mid LAD before the previously placed stent with diffuse disease and right to left collaterals. The RCA stents were patent. Ejection fraction was 50-55% and left ventricular end-diastolic pressure was 18. He was treated medically.   He has chronic medical conditions that include hypertension, hyperlipidemia, tobacco use, abdominal aortic aneurysm , excessive alcohol use and COPD.  Unfortunately, he continues to smoke 2 packs per day and drinks a 12 pack of beer daily.  He reports no chest pain. He continues to have stable exertional dyspnea. He reports inability to quit smoking or alcohol. No orthopnea, PND or palpitations.   Past Medical History:  Diagnosis Date  . AAA (abdominal aortic aneurysm) (HCC)   . AAA (abdominal aortic aneurysm) (HCC)    3.4 cm 2012  . CAD (coronary artery disease)    Previous RCA stents x2 in 2007 4 inferior ST elevation MI and mid LAD in 2008  . COPD (chronic obstructive pulmonary disease) (HCC)   . ETOH abuse   . History of kidney stones   . Hyperlipidemia   . Hypertension   . MI (myocardial infarction) (HCC)   . Obesity   . Panic disorder   . Tobacco abuse     Past Surgical History:  Procedure Laterality Date  . CARDIAC CATHETERIZATION   08/2006   x1 stent [facility]. Mid RCA: 3.5 x 16 mm overlap with 3.5 x 24 mm Liberte bare-metal stents  . CARDIAC CATHETERIZATION  10/2006   x1 stent [facility]: Mid LAD: 2.5 x 28 mm Cypher drug-eluting  . CARDIAC CATHETERIZATION N/A 07/25/2015   Procedure: Left Heart Cath and Coronary Angiography;  Surgeon: Iran Ouch, MD;  Location: [facility] INVASIVE CV LAB;  Service: Cardiovascular;  Laterality: N/A;  . MICRODISCECTOMY LUMBAR       Current Outpatient Prescriptions  Medication Sig Dispense Refill  . ALPRAZolam (XANAX) 0.5 MG tablet Take 0.5 mg by mouth 3 (three) times daily as needed for anxiety.    Marland Kitchen aspirin EC 81 MG tablet Take 1 tablet (81 mg total) by mouth daily. 90 tablet 3  . atorvastatin (LIPITOR) 40 MG tablet Take 1 tablet (40 mg total) by mouth daily. 30 tablet 6  . enalapril (VASOTEC) 20 MG tablet Take 1 tablet (20 mg total) by mouth 2 (two) times daily. 60 tablet 3  . metoprolol succinate (TOPROL XL) 25 MG 24 hr tablet Take 1 tablet (25 mg total) by mouth daily. 90 tablet 3  . nitroGLYCERIN (NITROSTAT) 0.4 MG SL tablet Place 0.4 mg under the tongue every 5 (five) minutes as needed for chest pain.     No current facility-administered medications for this visit.     Allergies:   Review of patient's allergies indicates  no known allergies.    Social History:  The patient  reports that he has been smoking Cigarettes.  He has a 100.00 pack-year smoking history. He has never used smokeless tobacco. He reports that he drinks about 50.4 oz of alcohol per week . He reports that he does not use drugs.   Family History:  The patient's family history includes Heart disease in his father; Hypertension in his mother.    ROS:  Please see the history of present illness.   Otherwise, review of systems are positive for none.   All other systems are reviewed and negative.    PHYSICAL EXAM: VS:  BP 130/62 (BP Location: Left Arm, Patient Position: Sitting, Cuff Size: Normal)   Pulse (!) 54   Ht 6'  (1.829 m)   Wt 206 lb 8 oz (93.7 kg)   BMI 28.01 kg/m  , BMI Body mass index is 28.01 kg/m. GEN: Well nourished, well developed, in no acute distress  HEENT: normal  Neck: no JVD, carotid bruits, or masses Cardiac: RRR; no murmurs, rubs, or gallops,no edema  Respiratory:  clear to auscultation bilaterally, normal work of breathing GI: soft, nontender, nondistended, + BS MS: no deformity or atrophy  Skin: warm and dry, no rash Neuro:  Strength and sensation are intact Psych: euthymic mood, full affect   EKG:  EKG is ordered today. EKG showed sinus bradycardia with sinus arrhythmia. No significant ST or T wave changes.   Recent Labs: 01/23/2016: ALT 32; BUN 9; Creatinine, Ser 0.96; Hemoglobin 15.4; Platelets 159; Potassium 4.4; Sodium 136    Lipid Panel    Component Value Date/Time   CHOL 168 03/19/2014 0810   TRIG 34 03/19/2014 0810   HDL 89 03/19/2014 0810   CHOLHDL 1.9 03/19/2014 0810   LDLCALC 72 03/19/2014 0810      Wt Readings from Last 3 Encounters:  06/25/16 206 lb 8 oz (93.7 kg)  01/23/16 203 lb (92.1 kg)  12/23/15 203 lb (92.1 kg)         ASSESSMENT AND PLAN:  1.  Coronary artery disease involving native coronary arteries with stable angina:  He reports no chest pain at the present time. He has stable exertional dyspnea likely due to COPD. Continue medical therapy.  2. Abdominal aortic aneurysm: Most recent ultrasound showed stable size of 4.5 x 4.4 centimeter. Repeat abdominal aortic ultrasound in March  3. Hyperlipidemia: Continue treatment with atorvastatin with a target LDL of less than 70.  4. Tobacco use: Unfortunately, he reports inability to quit at the present time.  5. Excessive alcohol use: He continues to drink a 12 pack of beer daily and has not been able to cut down.  6. Essential hypertension: Blood pressure is controlled on current medications.     Disposition:   FU with me in 6 months  Signed,  Lorine Bears, MD  06/25/2016  9:31 AM    [facility]

## 2016-09-07 DIAGNOSIS — L853 Xerosis cutis: Secondary | ICD-10-CM | POA: Diagnosis not present

## 2016-09-07 DIAGNOSIS — L408 Other psoriasis: Secondary | ICD-10-CM | POA: Diagnosis not present

## 2016-09-07 DIAGNOSIS — L570 Actinic keratosis: Secondary | ICD-10-CM | POA: Diagnosis not present

## 2016-09-07 DIAGNOSIS — D225 Melanocytic nevi of trunk: Secondary | ICD-10-CM | POA: Diagnosis not present

## 2016-09-07 DIAGNOSIS — D2261 Melanocytic nevi of right upper limb, including shoulder: Secondary | ICD-10-CM | POA: Diagnosis not present

## 2016-09-07 DIAGNOSIS — X32XXXA Exposure to sunlight, initial encounter: Secondary | ICD-10-CM | POA: Diagnosis not present

## 2016-09-08 DIAGNOSIS — J449 Chronic obstructive pulmonary disease, unspecified: Secondary | ICD-10-CM | POA: Diagnosis not present

## 2016-09-08 DIAGNOSIS — Z1211 Encounter for screening for malignant neoplasm of colon: Secondary | ICD-10-CM | POA: Diagnosis not present

## 2016-09-08 DIAGNOSIS — E7800 Pure hypercholesterolemia, unspecified: Secondary | ICD-10-CM | POA: Diagnosis not present

## 2016-09-08 DIAGNOSIS — Z0000 Encounter for general adult medical examination without abnormal findings: Secondary | ICD-10-CM | POA: Diagnosis not present

## 2016-09-08 DIAGNOSIS — I2510 Atherosclerotic heart disease of native coronary artery without angina pectoris: Secondary | ICD-10-CM | POA: Diagnosis not present

## 2016-09-08 DIAGNOSIS — Z125 Encounter for screening for malignant neoplasm of prostate: Secondary | ICD-10-CM | POA: Diagnosis not present

## 2016-09-08 DIAGNOSIS — I10 Essential (primary) hypertension: Secondary | ICD-10-CM | POA: Diagnosis not present

## 2016-09-08 DIAGNOSIS — I714 Abdominal aortic aneurysm, without rupture: Secondary | ICD-10-CM | POA: Diagnosis not present

## 2016-09-15 DIAGNOSIS — R319 Hematuria, unspecified: Secondary | ICD-10-CM | POA: Diagnosis not present

## 2016-10-01 IMAGING — CR DG CHEST 2V
2 series · 2 of 2 positions shown · non-contrast
Comparison: 10/23/2006

CLINICAL DATA: Pre-op for catherization on [REDACTED]. Patient has a
hX of previous stent placement, increasing SOB, and smoking.

EXAM:
CHEST  2 VIEW

[chest pa]
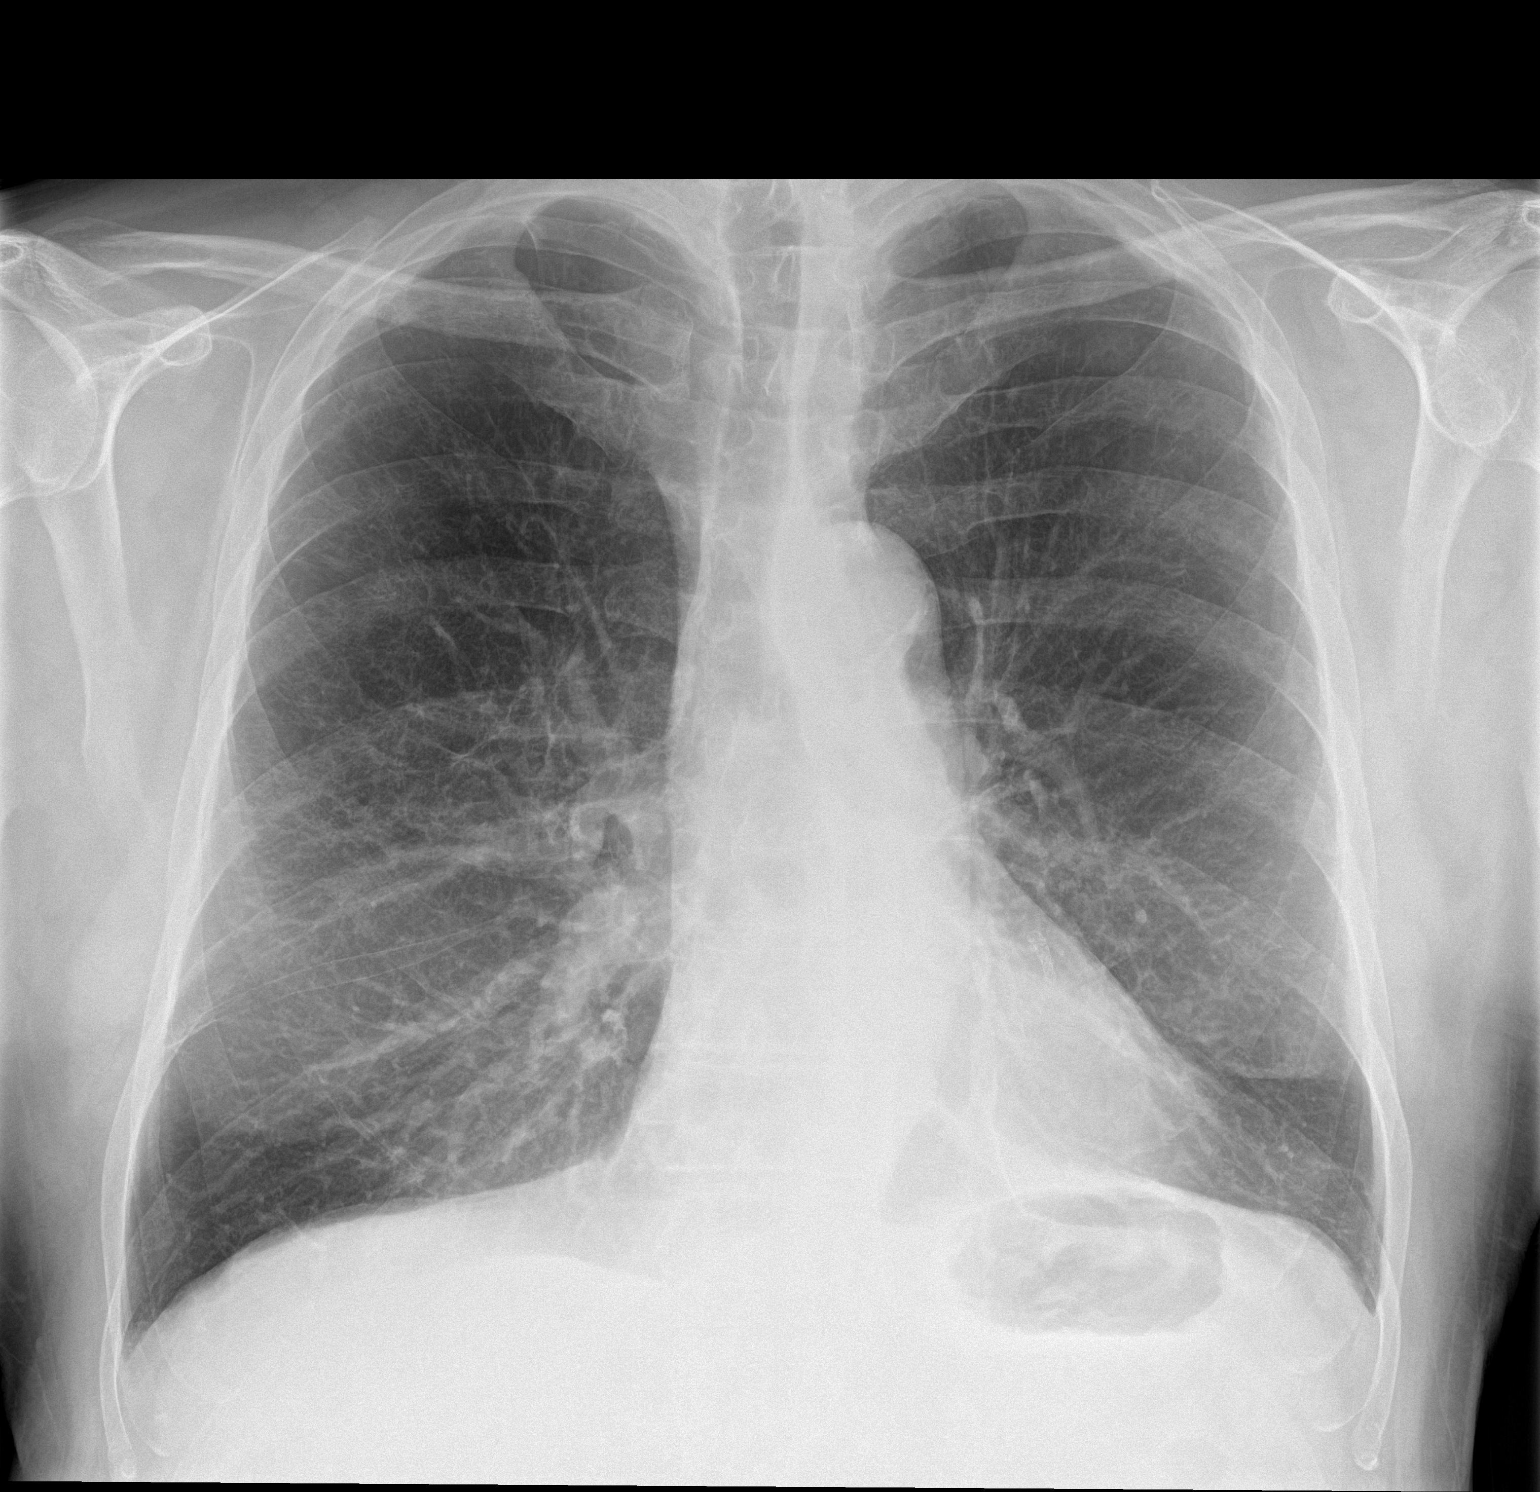

[chest lat]
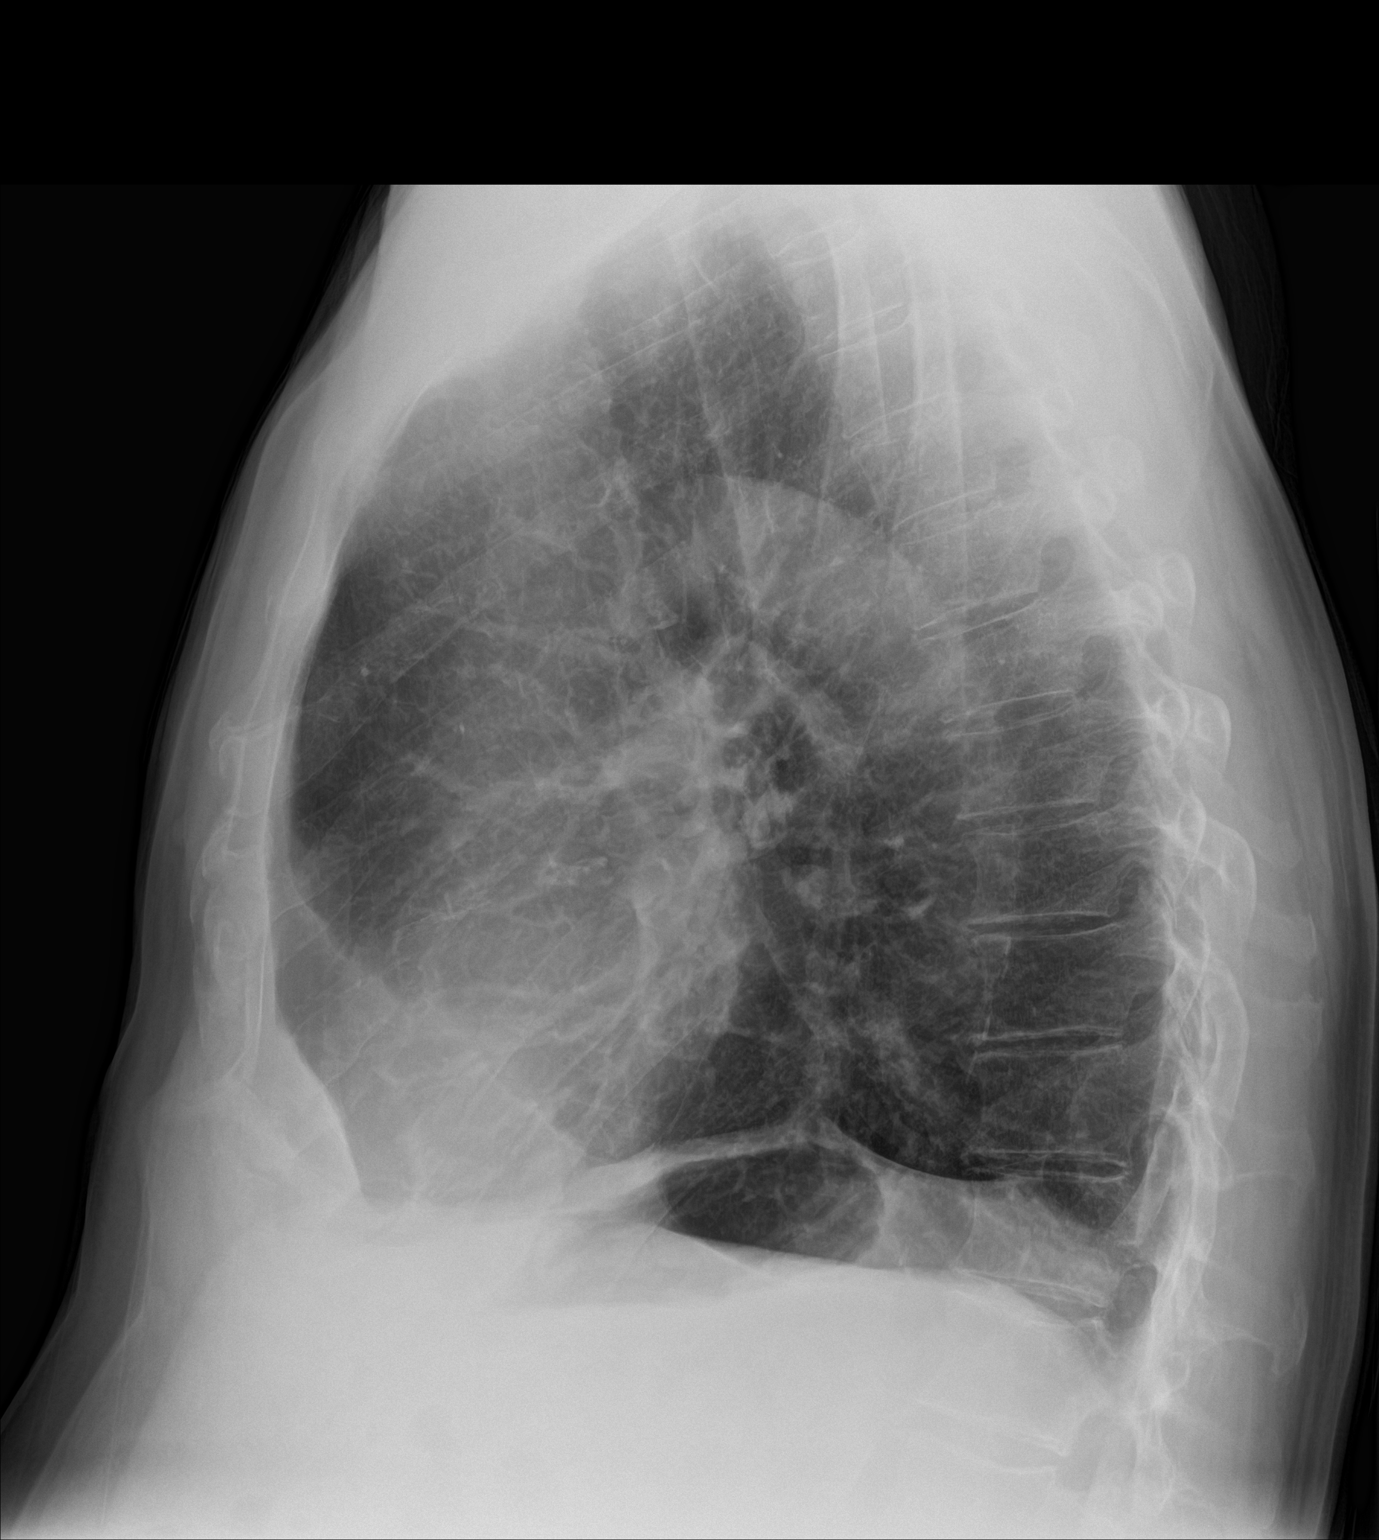

[2 of 2 positions shown; findings below may reference images not displayed]

FINDINGS: Cardiac silhouette is normal in size and configuration. There is a
stable left coronary artery stent. Aorta is mildly uncoiled. No
mediastinal or hilar masses or evidence of adenopathy.

Lungs are hyperexpanded. There are emphysematous bullae at the
apices. No lung consolidation or edema. No pleural effusion or
pneumothorax.

Bony thorax is demineralized but grossly intact. There has been no
significant change from the prior study.
IMPRESSION: No acute cardiopulmonary disease.

## 2016-12-07 ENCOUNTER — Other Ambulatory Visit: Payer: Medicare Other

## 2016-12-30 ENCOUNTER — Ambulatory Visit: Payer: Medicare Other

## 2016-12-30 DIAGNOSIS — I714 Abdominal aortic aneurysm, without rupture, unspecified: Secondary | ICD-10-CM

## 2017-03-04 ENCOUNTER — Encounter: Admission: RE | Payer: Self-pay | Source: Ambulatory Visit

## 2017-03-04 ENCOUNTER — Ambulatory Visit: Admission: RE | Admit: 2017-03-04 | Payer: Medicare Other | Source: Ambulatory Visit | Admitting: Gastroenterology

## 2017-03-04 SURGERY — COLONOSCOPY WITH PROPOFOL
Anesthesia: General

## 2017-03-08 DIAGNOSIS — L821 Other seborrheic keratosis: Secondary | ICD-10-CM | POA: Diagnosis not present

## 2017-03-08 DIAGNOSIS — L408 Other psoriasis: Secondary | ICD-10-CM | POA: Diagnosis not present

## 2017-03-08 DIAGNOSIS — D225 Melanocytic nevi of trunk: Secondary | ICD-10-CM | POA: Diagnosis not present

## 2017-03-08 DIAGNOSIS — L570 Actinic keratosis: Secondary | ICD-10-CM | POA: Diagnosis not present

## 2017-03-08 DIAGNOSIS — L218 Other seborrheic dermatitis: Secondary | ICD-10-CM | POA: Diagnosis not present

## 2017-03-08 DIAGNOSIS — X32XXXA Exposure to sunlight, initial encounter: Secondary | ICD-10-CM | POA: Diagnosis not present

## 2017-03-10 DIAGNOSIS — E7800 Pure hypercholesterolemia, unspecified: Secondary | ICD-10-CM | POA: Diagnosis not present

## 2017-03-10 DIAGNOSIS — Z720 Tobacco use: Secondary | ICD-10-CM | POA: Diagnosis not present

## 2017-03-10 DIAGNOSIS — I10 Essential (primary) hypertension: Secondary | ICD-10-CM | POA: Diagnosis not present

## 2017-03-10 DIAGNOSIS — I2510 Atherosclerotic heart disease of native coronary artery without angina pectoris: Secondary | ICD-10-CM | POA: Diagnosis not present

## 2017-03-10 DIAGNOSIS — J449 Chronic obstructive pulmonary disease, unspecified: Secondary | ICD-10-CM | POA: Diagnosis not present

## 2017-03-10 DIAGNOSIS — F410 Panic disorder [episodic paroxysmal anxiety] without agoraphobia: Secondary | ICD-10-CM | POA: Diagnosis not present

## 2017-04-14 ENCOUNTER — Telehealth: Payer: Self-pay | Admitting: Cardiovascular Disease

## 2017-04-14 NOTE — Telephone Encounter (Signed)
3 attempts to schedule fu appt from recall list.   Deleting recall.  6 month fu per ckout 06/25/16 Arida

## 2017-06-04 ENCOUNTER — Telehealth: Payer: Self-pay | Admitting: Cardiovascular Disease

## 2017-06-04 NOTE — Telephone Encounter (Signed)
Called to schedule 6 m fu AAA  No ans no vm

## 2017-07-02 ENCOUNTER — Other Ambulatory Visit: Payer: Self-pay | Admitting: Cardiovascular Disease

## 2017-07-02 DIAGNOSIS — I714 Abdominal aortic aneurysm, without rupture, unspecified: Secondary | ICD-10-CM

## 2017-07-08 ENCOUNTER — Ambulatory Visit (INDEPENDENT_AMBULATORY_CARE_PROVIDER_SITE_OTHER): Payer: Medicare Other

## 2017-07-08 ENCOUNTER — Ambulatory Visit: Admission: RE | Admit: 2017-07-08 | Payer: Medicare Other | Source: Ambulatory Visit | Admitting: Gastroenterology

## 2017-07-08 ENCOUNTER — Encounter: Admission: RE | Payer: Self-pay | Source: Ambulatory Visit

## 2017-07-08 DIAGNOSIS — I714 Abdominal aortic aneurysm, without rupture, unspecified: Secondary | ICD-10-CM

## 2017-07-08 DIAGNOSIS — Z23 Encounter for immunization: Secondary | ICD-10-CM

## 2017-07-08 SURGERY — COLONOSCOPY WITH PROPOFOL
Anesthesia: General

## 2017-07-14 ENCOUNTER — Other Ambulatory Visit: Payer: Self-pay | Admitting: *Deleted

## 2017-07-14 DIAGNOSIS — I714 Abdominal aortic aneurysm, without rupture, unspecified: Secondary | ICD-10-CM

## 2017-10-26 DIAGNOSIS — I2510 Atherosclerotic heart disease of native coronary artery without angina pectoris: Secondary | ICD-10-CM | POA: Diagnosis not present

## 2017-10-26 DIAGNOSIS — F410 Panic disorder [episodic paroxysmal anxiety] without agoraphobia: Secondary | ICD-10-CM | POA: Diagnosis not present

## 2017-10-26 DIAGNOSIS — E7800 Pure hypercholesterolemia, unspecified: Secondary | ICD-10-CM | POA: Diagnosis not present

## 2017-10-26 DIAGNOSIS — Z5181 Encounter for therapeutic drug level monitoring: Secondary | ICD-10-CM | POA: Diagnosis not present

## 2017-10-26 DIAGNOSIS — Z125 Encounter for screening for malignant neoplasm of prostate: Secondary | ICD-10-CM | POA: Diagnosis not present

## 2017-10-26 DIAGNOSIS — I10 Essential (primary) hypertension: Secondary | ICD-10-CM | POA: Diagnosis not present

## 2017-12-20 ENCOUNTER — Telehealth: Payer: Self-pay | Admitting: Cardiovascular Disease

## 2017-12-20 NOTE — Telephone Encounter (Signed)
Patient has been experiencing lots of swelling around the ankles Patient would like to know if it may be necessary for him to make an appt or can he be prescribed some fluid pills Please call to discuss

## 2017-12-21 NOTE — Telephone Encounter (Signed)
I spoke with the patient. He reports having feet/ ankle swelling intermittently "for months." He states this is left > right swelling, but his right side will typically improve with elevation at night. He confirms that he sits long periods of time in the kitchen watching TV. He thinks he is putting more pressure on his left leg the way he is sitting. He confirms he is drinking a lot of beer. He is requesting a fluid pill to see if this will help with his lower extremity swelling.  I advised him since we have not seen him since 06/2016 that he will need to come in for an office visit prior to having any diuretics prescribed.  He denies any other symptoms at this time. He is overdue for follow up with our office.  He is agreeable with follow up- I have scheduled him to see Ward Givenshris Berge, NP on 01/13/18 at 10:00 am. The patient is agreeable. He is aware to weigh daily and call should his weight go up 3 lbs or more in 24 hours or 5 lbs in a week, or if he becomes more SOB than normal (he is a current every day smoker).   The patient verbalizes understanding of the above.

## 2017-12-21 NOTE — Telephone Encounter (Signed)
Pt c/o swelling: STAT is pt has developed SOB within 24 hours  1) How much weight have you gained and in what time span? Unknown   2) If swelling, where is the swelling located?  Feet more so in left and has some numbness in that thigh   3) Are you currently taking a fluid pill?  No   4) Are you currently SOB? No   5) Do you have a log of your daily weights (if so, list)? No   6) Have you gained 3 pounds in a day or 5 pounds in a week? Unknown   7) Have you traveled recently? Watches tv in kitchen long periods of time

## 2018-01-13 ENCOUNTER — Encounter: Payer: Self-pay | Admitting: Nurse Practitioner

## 2018-01-13 ENCOUNTER — Ambulatory Visit: Payer: Medicare Other | Admitting: Nurse Practitioner

## 2018-01-13 VITALS — BP 154/72 | HR 50 | Ht 72.0 in | Wt 223.8 lb

## 2018-01-13 DIAGNOSIS — I714 Abdominal aortic aneurysm, without rupture, unspecified: Secondary | ICD-10-CM

## 2018-01-13 DIAGNOSIS — Z720 Tobacco use: Secondary | ICD-10-CM | POA: Diagnosis not present

## 2018-01-13 DIAGNOSIS — R600 Localized edema: Secondary | ICD-10-CM | POA: Diagnosis not present

## 2018-01-13 DIAGNOSIS — F1010 Alcohol abuse, uncomplicated: Secondary | ICD-10-CM | POA: Diagnosis not present

## 2018-01-13 DIAGNOSIS — I2510 Atherosclerotic heart disease of native coronary artery without angina pectoris: Secondary | ICD-10-CM | POA: Diagnosis not present

## 2018-01-13 DIAGNOSIS — E785 Hyperlipidemia, unspecified: Secondary | ICD-10-CM | POA: Diagnosis not present

## 2018-01-13 DIAGNOSIS — I509 Heart failure, unspecified: Secondary | ICD-10-CM | POA: Diagnosis not present

## 2018-01-13 DIAGNOSIS — I10 Essential (primary) hypertension: Secondary | ICD-10-CM | POA: Diagnosis not present

## 2018-01-13 MED ORDER — POTASSIUM CHLORIDE CRYS ER 20 MEQ PO TBCR: 20.0000 meq | EXTENDED_RELEASE_TABLET | Freq: Every day | ORAL | 3 refills | Status: DC

## 2018-01-13 MED ORDER — FUROSEMIDE 40 MG PO TABS: 40.0000 mg | ORAL_TABLET | Freq: Every day | ORAL | 3 refills | Status: DC

## 2018-01-13 NOTE — Patient Instructions (Signed)
Medication Instructions: - Your physician has recommended you make the following change in your medication:   1) START lasix (furosemide) 40 mg - take 1 tablet (40 mg) by mouth TWICE daily x 3 days, then  - take 1 tablet (40 mg) by mouth ONCE daily  2) START potassium 20 meq- take 1 tablet (20 meq) by mouth once daily  3) DO NOT take any more metoprolol  Labwork: - Your physician recommends that you have lab work today: CBC/ CMET/ BNP  Procedures/Testing: - Your physician has requested that you have an echocardiogram. Echocardiography is a painless test that uses sound waves to create images of your heart. It provides your doctor with information about the size and shape of your heart and how well your heart's chambers and valves are working. This procedure takes approximately one hour. There are no restrictions for this procedure.  Follow-Up: - Your physician recommends that you schedule a follow-up appointment in: 2 weeks with Dr. Kirke CorinArida/ Ward Givenshris Berge, NP.   Any Additional Special Instructions Will Be Listed Below (If Applicable).     If you need a refill on your cardiac medications before your next appointment, please call your pharmacy.

## 2018-01-13 NOTE — Progress Notes (Signed)
Office Visit    Patient Name: Bobby Sandoval Date of Encounter: 01/13/2018  Primary Care Provider:  Rafael Bihari, MD Primary Cardiologist:  Lorine Bears, MD  Chief Complaint    72 year old male with a history of CAD status post prior RCA and LAD stenting, abdominal aortic aneurysm, diastolic dysfunction, alcohol abuse, COPD, hypertension, hyperlipidemia, and obesity, who presents for follow-up related to lower extremity swelling.  Past Medical History    Past Medical History:  Diagnosis Date  . AAA (abdominal aortic aneurysm) (HCC)    a. 2012 3.4 cm; b. 07/2017 Abd u/s: Dist abd Ao dil up to 4.8cm - stable compared to 12/2016.  Marland Kitchen CAD (coronary artery disease)    a. 2007 Inf MI w/ BMS to RCA x 2; b. 2008 PCI/DES to mid LAD @ [facility]; c. 07/2015 Cath: LAD 100 prox to prev placed stent w/ R->L collats, RCA stents patent. EF 50-55%. Med Rx.  Marland Kitchen COPD (chronic obstructive pulmonary disease) (HCC)   . Diastolic dysfunction    a. 02/2014 Echo: EF 55-65%. Gr1 DD, no rwma, mildly dil LA, nl RV fxn.  Marland Kitchen ETOH abuse   . History of kidney stones   . Hyperlipidemia   . Hypertension   . MI (myocardial infarction) (HCC)   . Obesity   . Panic disorder   . Tobacco abuse    Past Surgical History:  Procedure Laterality Date  . CARDIAC CATHETERIZATION  08/2006   x1 stent [facility]. Mid RCA: 3.5 x 16 mm overlap with 3.5 x 24 mm Liberte bare-metal stents  . CARDIAC CATHETERIZATION  10/2006   x1 stent [facility]: Mid LAD: 2.5 x 28 mm Cypher drug-eluting  . CARDIAC CATHETERIZATION N/A 07/25/2015   Procedure: Left Heart Cath and Coronary Angiography;  Surgeon: Iran Ouch, MD;  Location: [facility] INVASIVE CV LAB;  Service: Cardiovascular;  Laterality: N/A;  . MICRODISCECTOMY LUMBAR      Allergies  No Known Allergies  History of Present Illness    72 year old male with the above past medical history including coronary artery disease status post prior inferior myocardial infarction requiring bare-metal  stenting x2 to the mid RCA in 2007.  He subsequently underwent PCI and drug-eluting stent placement to the mid LAD in 2008.  In October 2016, he underwent diagnostic catheterization revealing an occluded mid LAD proximal to the previously placed stent with diffuse disease and right to left collaterals.  The RCA stents were patent.  EF was 50-55%.  Other history includes hypertension, hyperlipidemia, ongoing tobacco abuse, COPD, abdominal aortic aneurysm, and alcohol abuse.  He was last seen in clinic in September 2017.  He says that over the past year or so, he is noticed increasing lower extremity swelling.  This was recorded as being trace when he was seen by primary care in June 2018.  His weight at that time was 210 pounds.  Since then, he has noticed significant increase in lower extremity edema to the point where he can barely get his shoes on.  He has also had 13 pound weight gain since June 2018.  When he saw primary care in January, the note says he had no edema though he says he is certain he had significant edema.  His weight at that time was 223 pounds.  Weight is 223 today.  He has chronic dyspnea on exertion which he cannot be sure is any worse than usual.  He is very sedentary and says he sits most of the day snacking on  salty items and watching TV.  He also smokes 2 packs a day and drinks 12 beers a day.  He has not had any chest pain and denies PND, orthopnea, dizziness, syncope, or early satiety.  Home Medications    Prior to Admission medications   Medication Sig Start Date End Date Taking? Authorizing Provider  ALPRAZolam Prudy Feeler(XANAX) 0.5 MG tablet Take 0.5 mg by mouth 3 (three) times daily as needed for anxiety.   Yes [provider]  aspirin EC 81 MG tablet Take 1 tablet (81 mg total) by mouth daily. 07/08/15  Yes Iran OuchArida, Muhammad A, MD  atorvastatin (LIPITOR) 40 MG tablet Take 1 tablet (40 mg total) by mouth daily. 12/23/15  Yes Iran OuchArida, Muhammad A, MD  enalapril (VASOTEC) 20 MG  tablet Take 1 tablet (20 mg total) by mouth 2 (two) times daily. 10/30/15  Yes Iran OuchArida, Muhammad A, MD  metoprolol succinate (TOPROL XL) 25 MG 24 hr tablet Take 1 tablet (25 mg total) by mouth daily. 08/23/15  Yes Iran OuchArida, Muhammad A, MD  nitroGLYCERIN (NITROSTAT) 0.4 MG SL tablet Place 0.4 mg under the tongue every 5 (five) minutes as needed for chest pain.   Yes [provider]    Review of Systems    Progressive lower extremity edema in the setting of chronic dyspnea on exertion.  No chest pain.  Denies palpitations, PND, orthopnea, dizziness, syncope, or early satiety..  All other systems reviewed and are otherwise negative except as noted above.  Physical Exam    VS:  BP (!) 154/72 (BP Location: Left Arm, Patient Position: Sitting, Cuff Size: Normal)   Pulse (!) 50   Ht 6' (1.829 m)   Wt 223 lb 12 oz (101.5 kg)   BMI 30.35 kg/m  , BMI Body mass index is 30.35 kg/m. GEN: Well nourished, well developed, in no acute distress.  Smells of cigarette smoke. HEENT: normal.  Neck: Supple, JVP approximately 12 cm.  No carotid bruits, or masses. Cardiac: RRR, no murmurs, rubs, or gallops. No clubbing, cyanosis.  3+ bilateral lower extremity edema to the calves.  Radials/DP/PT 2+ and equal bilaterally.  Respiratory:  Respirations regular and unlabored, clear to auscultation bilaterally. GI: Semi-firm and protuberant.  Nontender.  Sinus bradycardia, 50 BS + x 4. MS: no deformity or atrophy. Skin: warm and dry, no rash. Neuro:  Strength and sensation are intact. Psych: Normal affect.  Accessory Clinical Findings    ECG -, nonspecific ST changes.  Assessment & Plan    1.  Acute congestive heart failure: Over the past year, patient has noticed progressive lower extremity swelling which appears to have really increased over the past 10 months.  Weight is up 13 pounds since June 2018.  He has chronic dyspnea on exertion, that occurs with minimal activity.  He is not sure that this is any  worse over the past 6 months.  He has not been having any chest pain.  He does have a lot of salt in his diet and eats a fair amount of processed food and salty snacks.  He is also drinking a 12 pack of beer a day.  He is significantly volume overloaded on exam with JVD and lower extremity edema.  I am going to check labs today including a CBC, complete metabolic panel, and BNP.  I am adding Lasix 40 mg twice daily x3 days and then 40 mg daily.  I will also add supplemental potassium.  We will arrange for a follow-up echocardiogram as I do  have some concern that he may have developed a cardiomyopathy in the setting of coronary disease and significant alcohol abuse.  Continue ACE inhibitor therapy.  Metoprolol was on his list but he has not been taking as he frequently notes heart rates in the 50s.  Heart rate is 50 today and he has not taken metoprolol.  I have advised him to just discontinue this altogether.  I will plan to see him back in 2 weeks.  2.  Coronary artery disease: Status post prior RCA and LAD stenting with subsequent finding of occlusion of the LAD with right-to-left collaterals in 2016.  He has been medically managed.  He has not been having any chest pain but does have chronic dyspnea on exertion.  As above, follow-up echocardiogram.  If EF is down, we will need to pursue an ischemic evaluation.  Continue aspirin and statin therapy.  3.  Essential hypertension: Blood pressure elevated.  Adding Lasix and diuresing as above.  Continue ACE inhibitor.  4.  Hyperlipidemia: Last LDL I can find was 80 in December 2017.  This will need follow-up when fasting.  Continue statin therapy.  He had normal LFTs in December 2017.  This we followed up today.  5.  Tobacco abuse: Smoking 2 packs a day.  Cessation advised.  He is not interested in quitting.  6.  Alcohol abuse: Drinking 12 beers a day.  We discussed how alcohol is a toxin and may contribute to the development of heart failure.  He understands  but is not interested in quitting.  7.  Abdominal aortic aneurysm: Stable at 4.8 cm in 07/2017.  Due for f/u later this month.  8.  Dispo:  Labs today.  F/u echo.  F/u in 2 wks.  Nicolasa Ducking, NP 01/13/2018, 10:16 AM

## 2018-01-14 LAB — COMPREHENSIVE METABOLIC PANEL
ALBUMIN: 4.3 g/dL (ref 3.5–4.8)
ALK PHOS: 69 IU/L (ref 39–117)
ALT: 29 IU/L (ref 0–44)
AST: 30 IU/L (ref 0–40)
Albumin/Globulin Ratio: 1.9 (ref 1.2–2.2)
BUN / CREAT RATIO: 8 — AB (ref 10–24)
BUN: 6 mg/dL — ABNORMAL LOW (ref 8–27)
Bilirubin Total: 0.9 mg/dL (ref 0.0–1.2)
CO2: 22 mmol/L (ref 20–29)
CREATININE: 0.75 mg/dL — AB (ref 0.76–1.27)
Calcium: 9.2 mg/dL (ref 8.6–10.2)
Chloride: 99 mmol/L (ref 96–106)
GFR calc Af Amer: 105 mL/min/{1.73_m2} (ref >59)
GFR calc non Af Amer: 91 mL/min/{1.73_m2} (ref >59)
GLOBULIN, TOTAL: 2.3 g/dL (ref 1.5–4.5)
Glucose: 100 mg/dL — ABNORMAL HIGH (ref 65–99)
Potassium: 4.6 mmol/L (ref 3.5–5.2)
SODIUM: 137 mmol/L (ref 134–144)
Total Protein: 6.6 g/dL (ref 6.0–8.5)

## 2018-01-14 LAB — CBC WITH DIFFERENTIAL/PLATELET
Basophils Absolute: 0 10*3/uL (ref 0.0–0.2)
Basos: 0 %
EOS (ABSOLUTE): 0.1 10*3/uL (ref 0.0–0.4)
EOS: 2 %
HEMATOCRIT: 49.1 % (ref 37.5–51.0)
Hemoglobin: 16.6 g/dL (ref 13.0–17.7)
Immature Grans (Abs): 0 10*3/uL (ref 0.0–0.1)
Immature Granulocytes: 0 %
LYMPHS ABS: 1.4 10*3/uL (ref 0.7–3.1)
Lymphs: 25 %
MCH: 32 pg (ref 26.6–33.0)
MCHC: 33.8 g/dL (ref 31.5–35.7)
MCV: 95 fL (ref 79–97)
MONOS ABS: 0.5 10*3/uL (ref 0.1–0.9)
Monocytes: 9 %
Neutrophils Absolute: 3.3 10*3/uL (ref 1.4–7.0)
Neutrophils: 64 %
Platelets: 186 10*3/uL (ref 150–379)
RBC: 5.19 x10E6/uL (ref 4.14–5.80)
RDW: 13.6 % (ref 12.3–15.4)
WBC: 5.3 10*3/uL (ref 3.4–10.8)

## 2018-01-14 LAB — BRAIN NATRIURETIC PEPTIDE: BNP: 85.5 pg/mL (ref 0.0–100.0)

## 2018-01-27 ENCOUNTER — Telehealth: Payer: Self-pay | Admitting: *Deleted

## 2018-01-27 ENCOUNTER — Ambulatory Visit (INDEPENDENT_AMBULATORY_CARE_PROVIDER_SITE_OTHER): Payer: Medicare Other

## 2018-01-27 ENCOUNTER — Other Ambulatory Visit: Payer: Self-pay

## 2018-01-27 DIAGNOSIS — I2510 Atherosclerotic heart disease of native coronary artery without angina pectoris: Secondary | ICD-10-CM | POA: Diagnosis not present

## 2018-01-27 DIAGNOSIS — I509 Heart failure, unspecified: Secondary | ICD-10-CM

## 2018-01-27 MED ORDER — POTASSIUM CHLORIDE ER 10 MEQ PO TBCR: 20.0000 meq | EXTENDED_RELEASE_TABLET | Freq: Every day | ORAL | 3 refills | Status: DC

## 2018-01-27 NOTE — Telephone Encounter (Signed)
Patient called back. He would like to try the potassium in the 10 mEq size, taking 2 tablets (20 mEq) by mouth once a day. We discussed him calling his pharmacy to see if it was ok to crush his current tablet; otherwise he will try the 10 mEq tablets. Rx sent to pharmacy.

## 2018-01-27 NOTE — Telephone Encounter (Signed)
Was told by Jillyn HiddenGary in echo that patient was having trouble swallowing his potassium pills and remembered someone saying we could send in a smaller tablet.  No answer. Left message to call back.

## 2018-01-28 NOTE — Progress Notes (Signed)
Cardiology Office Note Date:  02/03/2018  Patient ID:  Bobby Sandoval, [place] 12-Jun-1946, MRN [id number] PCP:  Rafael Bihari, MD  Cardiologist:  Dr. Kirke Corin, MD    Chief Complaint: Follow up  History of Present Illness: Bobby Sandoval is a 72 y.o. male with history of CAD s/p prior LAD and RCA stenting, stable AAA by ultrasound in 07/2017, diastolic dysfunction, COPD, ongoing tobacco abuse at 2 packs daily and ongoing alcohol abuse at a 12-pack daily, HTN, HLD, and obesity who presents for follow up of acute and chronic diastolic CHF.   Patient previously was admitted with an inferior MI in 2007 with PCI/BMS x 2 to the mid RCA followed by subsequent PCI/DES to the mid LAD in 2008. He underwent diagnostic LHC in 07/2015 that showed an occluded mid LAD proximal to the previously placed stent with diffuse disease and right-to-left collaterals. The RCA stents were patent. EF estimated at 50-55%. He was medically managed. He was most recently seen in the office on 01/13/2018 for evaluation of increasing lower extremity swelling. Prior to that visit, he had not been seen in the office since 06/2016. His lower extremity swelling had been increasing since at least June, 2018. There was also associated weight gain of approximately 13 pounds from June, 2018 to his office visit with his PCP in 10/2017 (210--223 pounds). At his office visit with [place] on 01/13/18 he was noted to weight 223 pounds and was noted to have chronic DOE. He reported a fairly sedentary lifestyle and spent most of his time snacking on salty foods and watching TV. He was also noted to have self-discontinued his metoprolol given heart rates in the 50s bpm. He was started on Lasix 40 mg bid x 3 days, followed by 40 mg daily thereafter. He was also scheduled for a follow up abdominal ultrasound to evaluate his AAA which showed the largest aortic measurement was 5.3 cm with the previous measurement being 4.8 cm obtained in 07/2017.  He was  referred to vascular surgery. Labs check on 4/11 showed a BNP of 85.5, unremarkable CBC, BUN/SCr 6/0.75, glucose 100, K+ 4.6, normal LFT. TTE 01/27/2018 showed EF 60-65%, normal wall motion, Gr1DD, mild MR, mildly dilated LA, RVSF normal, PASP normal.  He comes in doing well today.  No chest pain or shortness of breath.  He feels like his lower extremity swelling is about the same.  His weight is down 3 pounds when compared to office visit on 01/13/2018 from 223 pounds to 220 pounds.  He is uncertain if he has noted increased urine output on furosemide.  He continues to drink a 12 pack of beer daily and reports "I take care of my beer."  He also continues to smoke and is not interested in quitting.  He does report having cut out his salty snacks.  He denies any orthopnea, cough, abdominal distention, or early satiety.   Past Medical History:  Diagnosis Date  . AAA (abdominal aortic aneurysm) (HCC)    a. 2012 3.4 cm; b. 07/2017 Abd u/s: Dist abd Ao dil up to 4.8cm - stable compared to 12/2016.  Marland Kitchen CAD (coronary artery disease)    a. 2007 Inf MI w/ BMS to RCA x 2; b. 2008 PCI/DES to mid LAD @ [facility]; c. 07/2015 Cath: LAD 100 prox to prev placed stent w/ R->L collats, RCA stents patent. EF 50-55%. Med Rx.  Marland Kitchen COPD (chronic obstructive pulmonary disease) (HCC)   . Diastolic dysfunction    a.  02/2014 Echo: EF 55-65%. Gr1 DD, no rwma, mildly dil LA, nl RV fxn.  Marland Kitchen ETOH abuse   . History of kidney stones   . Hyperlipidemia   . Hypertension   . MI (myocardial infarction) (HCC)   . Obesity   . Panic disorder   . Tobacco abuse     Past Surgical History:  Procedure Laterality Date  . CARDIAC CATHETERIZATION  08/2006   x1 stent [facility]. Mid RCA: 3.5 x 16 mm overlap with 3.5 x 24 mm Liberte bare-metal stents  . CARDIAC CATHETERIZATION  10/2006   x1 stent [facility]: Mid LAD: 2.5 x 28 mm Cypher drug-eluting  . CARDIAC CATHETERIZATION N/A 07/25/2015   Procedure: Left Heart Cath and Coronary Angiography;  Surgeon: Iran Ouch, MD;  Location: [facility] INVASIVE CV LAB;  Service: Cardiovascular;  Laterality: N/A;  . MICRODISCECTOMY LUMBAR      Current Meds  Medication Sig  . ALPRAZolam (XANAX) 0.5 MG tablet Take 0.5 mg by mouth 3 (three) times daily as needed for anxiety.  Marland Kitchen aspirin EC 81 MG tablet Take 1 tablet (81 mg total) by mouth daily.  Marland Kitchen atorvastatin (LIPITOR) 40 MG tablet Take 1 tablet (40 mg total) by mouth daily.  . enalapril (VASOTEC) 20 MG tablet Take 1 tablet (20 mg total) by mouth 2 (two) times daily.  . furosemide (LASIX) 40 MG tablet Take 1 tablet (40 mg total) by mouth daily.  . nitroGLYCERIN (NITROSTAT) 0.4 MG SL tablet Place 0.4 mg under the tongue every 5 (five) minutes as needed for chest pain.  . potassium chloride (K-DUR) 10 MEQ tablet Take 2 tablets (20 mEq total) by mouth daily.    Allergies:   Patient has no known allergies.   Social History:  The patient  reports that he has been smoking cigarettes.  He has a 100.00 pack-year smoking history. He has never used smokeless tobacco. He reports that he drinks about 50.4 oz of alcohol per week. He reports that he does not use drugs.   Family History:  The patient's family history includes Heart disease in his father; Hypertension in his mother.  ROS:   Review of Systems  Constitutional: Positive for malaise/fatigue. Negative for chills, diaphoresis, fever and weight loss.  HENT: Negative for congestion.   Eyes: Negative for discharge and redness.  Respiratory: Negative for cough, hemoptysis, sputum production, shortness of breath and wheezing.   Cardiovascular: Positive for leg swelling. Negative for chest pain, palpitations, orthopnea, claudication and PND.  Gastrointestinal: Negative for abdominal pain, blood in stool, heartburn, melena, nausea and vomiting.  Genitourinary: Negative for hematuria.  Musculoskeletal: Negative for falls and myalgias.  Skin: Negative for rash.  Neurological: Negative for dizziness, tingling, tremors,  sensory change, speech change, focal weakness, loss of consciousness and weakness.  Endo/Heme/Allergies: Does not bruise/bleed easily.  Psychiatric/Behavioral: Negative for substance abuse. The patient is not nervous/anxious.   All other systems reviewed and are negative.    PHYSICAL EXAM:  VS:  BP 140/60 (BP Location: Left Arm, Patient Position: Sitting, Cuff Size: Normal)   Pulse 70   Ht 6' (1.829 m)   Wt 220 lb (99.8 kg)   BMI 29.84 kg/m  BMI: Body mass index is 29.84 kg/m.  Physical Exam  Constitutional: He is oriented to person, place, and time. He appears well-developed and well-nourished.  HENT:  Head: Normocephalic and atraumatic.  Eyes: Right eye exhibits no discharge. Left eye exhibits no discharge.  Neck: Normal range of motion. JVD present.  JVD elevated  approximately 8 to 10 cm  Cardiovascular: Normal rate, regular rhythm, S1 normal, S2 normal and normal heart sounds. Exam reveals no distant heart sounds, no friction rub, no midsystolic click and no opening snap.  No murmur heard. Pulmonary/Chest: Effort normal and breath sounds normal. No respiratory distress. He has no decreased breath sounds. He has no wheezes. He has no rales. He exhibits no tenderness.  Abdominal: Soft. He exhibits no distension. There is no tenderness.  Musculoskeletal: He exhibits edema.  2+ bilateral pitting edema to the knees with changes consistent with chronic venous stasis  Neurological: He is alert and oriented to person, place, and time.  Skin: Skin is warm and dry. No cyanosis. Nails show no clubbing.  Psychiatric: He has a normal mood and affect. His speech is normal and behavior is normal. Judgment and thought content normal.      EKG:  Was not ordered today.  Recent Labs: 01/13/2018: ALT 29; BNP 85.5; BUN 6; Creatinine, Ser 0.75; Hemoglobin 16.6; Platelets 186; Potassium 4.6; Sodium 137  No results found for requested labs within last 8760 hours.   Estimated Creatinine Clearance:  100.6 mL/min (A) (by C-G formula based on SCr of 0.75 mg/dL (L)).   Wt Readings from Last 3 Encounters:  02/03/18 220 lb (99.8 kg)  01/13/18 223 lb 12 oz (101.5 kg)  06/25/16 206 lb 8 oz (93.7 kg)     Other studies reviewed: Additional studies/records reviewed today include: summarized above  ASSESSMENT AND PLAN:  1. Acute on chronic diastolic CHF: Weight is down 3 pounds from 01/13/2018 office visit.  However, the patient continues to drink a 12 pack of beer daily which is contributing to his volume overload and heart failure significantly.  He reports minimal increased urine output on Lasix though he does note this may be somewhat skewed given his excessive p.o. fluid intake with coffee and alcohol.  We will transition him to torsemide 20 mg daily.  CHF education provided.  Daily weights.  2. CAD of native coronary arteries without angina: No symptoms concerning for chest pain.  Chronic dyspnea on exertion is stable.  Recent transthoracic echocardiogram demonstrated normal LV systolic function and wall motion.  Continue secondary prevention with aggressive risk factor modification.  No plans for ischemic evaluation at this time.  3. Chronic venous insufficiency: Prescription for 20 to 30 mmHg compression stockings provided.  Elevate legs.  Torsemide as above.  4. Essential hypertension: Blood pressure is mildly elevated today at 140/60.  Change furosemide to torsemide as above.  Continue enalapril.  May benefit from addition of beta-blockade or spironolactone in follow-up, however he has previously self discontinued beta-blocker in the setting of bradycardic heart rates.  Continue to monitor.  Alcohol and tobacco cessation is strongly advised.  5. Hyperlipidemia: Check fasting lipid and liver function today.  For now, continue atorvastatin 40 mg daily.  Escalate statin therapy as indicated.  6. AAA: Abdominal ultrasound from 01/31/2018 noted as above.  He has been referred to vein and  vascular.  He would like to see Dr. Drucilla Schmidt.  We will have our office contact vein and vascular to ensure that the referral has been made.  7. Polysubstance abuse: Cessation of alcohol and tobacco abuse is strongly encouraged.  He has not yet ready to quit either.  Disposition: F/u with myself in 1 month.  Current medicines are reviewed at length with the patient today.  The patient did not have any concerns regarding medicines.  Signed, Eula Listen, PA-C 02/03/2018 8:35  AM     [facility] [place] [address] [place], [place] [address] [phone]

## 2018-01-31 ENCOUNTER — Ambulatory Visit (INDEPENDENT_AMBULATORY_CARE_PROVIDER_SITE_OTHER): Payer: Medicare Other

## 2018-01-31 DIAGNOSIS — I714 Abdominal aortic aneurysm, without rupture, unspecified: Secondary | ICD-10-CM

## 2018-02-01 ENCOUNTER — Other Ambulatory Visit: Payer: Self-pay | Admitting: *Deleted

## 2018-02-01 ENCOUNTER — Telehealth: Payer: Self-pay | Admitting: Cardiovascular Disease

## 2018-02-01 DIAGNOSIS — I714 Abdominal aortic aneurysm, without rupture, unspecified: Secondary | ICD-10-CM

## 2018-02-01 NOTE — Telephone Encounter (Signed)
Pt calling stating he is returning our call  Please call back

## 2018-02-01 NOTE — Telephone Encounter (Signed)
Patient calling back for AAA results. He verbalized understanding and is agreeable to referral to [facility]. He had additional questions regarding what interventions may be done and why is legs are still swelling. Advised that these questions would be best addressed by Alycia Rossetti at appointment on Thursday and at referral appointment. He verbalized understanding.

## 2018-02-03 ENCOUNTER — Ambulatory Visit (INDEPENDENT_AMBULATORY_CARE_PROVIDER_SITE_OTHER): Payer: Medicare Other | Admitting: Physician Assistant

## 2018-02-03 ENCOUNTER — Encounter: Payer: Self-pay | Admitting: Physician Assistant

## 2018-02-03 VITALS — BP 140/60 | HR 70 | Ht 72.0 in | Wt 220.0 lb

## 2018-02-03 DIAGNOSIS — Z720 Tobacco use: Secondary | ICD-10-CM

## 2018-02-03 DIAGNOSIS — I5033 Acute on chronic diastolic (congestive) heart failure: Secondary | ICD-10-CM

## 2018-02-03 DIAGNOSIS — I25118 Atherosclerotic heart disease of native coronary artery with other forms of angina pectoris: Secondary | ICD-10-CM

## 2018-02-03 DIAGNOSIS — I10 Essential (primary) hypertension: Secondary | ICD-10-CM

## 2018-02-03 DIAGNOSIS — Z79899 Other long term (current) drug therapy: Secondary | ICD-10-CM

## 2018-02-03 DIAGNOSIS — F1010 Alcohol abuse, uncomplicated: Secondary | ICD-10-CM | POA: Diagnosis not present

## 2018-02-03 DIAGNOSIS — I714 Abdominal aortic aneurysm, without rupture, unspecified: Secondary | ICD-10-CM

## 2018-02-03 DIAGNOSIS — I2510 Atherosclerotic heart disease of native coronary artery without angina pectoris: Secondary | ICD-10-CM

## 2018-02-03 MED ORDER — TORSEMIDE 20 MG PO TABS: 20.0000 mg | ORAL_TABLET | Freq: Every day | ORAL | 3 refills | Status: DC

## 2018-02-03 NOTE — Patient Instructions (Signed)
Medication Instructions:  Your physician has recommended you make the following change in your medication:  STOP Lasix. START Torsemide 20 mg (1 tablet) by mouth once a day.   Labwork: Your physician recommends that you return for lab work in: TODAY (CMET, CBC, LIPID).   Testing/Procedures: none  Follow-Up: Your physician recommends that you schedule a follow-up appointment in: 1 MONTH.  You have been provided with a written prescription for compression stockings. You may take it to a drug store or medical supply store of your choice.    If you need a refill on your cardiac medications before your next appointment, please call your pharmacy.

## 2018-02-04 LAB — LIPID PANEL
CHOLESTEROL TOTAL: 174 mg/dL (ref 100–199)
Chol/HDL Ratio: 3.3 ratio (ref 0.0–5.0)
HDL: 53 mg/dL (ref >39)
LDL CALC: 108 mg/dL — AB (ref 0–99)
TRIGLYCERIDES: 64 mg/dL (ref 0–149)
VLDL CHOLESTEROL CAL: 13 mg/dL (ref 5–40)

## 2018-02-04 LAB — COMPREHENSIVE METABOLIC PANEL
A/G RATIO: 1.6 (ref 1.2–2.2)
ALK PHOS: 70 IU/L (ref 39–117)
ALT: 20 IU/L (ref 0–44)
AST: 23 IU/L (ref 0–40)
Albumin: 3.9 g/dL (ref 3.5–4.8)
BILIRUBIN TOTAL: 0.7 mg/dL (ref 0.0–1.2)
BUN/Creatinine Ratio: 8 — ABNORMAL LOW (ref 10–24)
BUN: 7 mg/dL — AB (ref 8–27)
CHLORIDE: 102 mmol/L (ref 96–106)
CO2: 23 mmol/L (ref 20–29)
Calcium: 9.5 mg/dL (ref 8.6–10.2)
Creatinine, Ser: 0.87 mg/dL (ref 0.76–1.27)
GFR calc Af Amer: 99 mL/min/{1.73_m2} (ref >59)
GFR calc non Af Amer: 86 mL/min/{1.73_m2} (ref >59)
GLUCOSE: 100 mg/dL — AB (ref 65–99)
Globulin, Total: 2.4 g/dL (ref 1.5–4.5)
POTASSIUM: 4.7 mmol/L (ref 3.5–5.2)
Sodium: 138 mmol/L (ref 134–144)
Total Protein: 6.3 g/dL (ref 6.0–8.5)

## 2018-02-04 LAB — CBC
Hematocrit: 47.9 % (ref 37.5–51.0)
Hemoglobin: 16.7 g/dL (ref 13.0–17.7)
MCH: 32.1 pg (ref 26.6–33.0)
MCHC: 34.9 g/dL (ref 31.5–35.7)
MCV: 92 fL (ref 79–97)
PLATELETS: 180 10*3/uL (ref 150–379)
RBC: 5.2 x10E6/uL (ref 4.14–5.80)
RDW: 13.8 % (ref 12.3–15.4)
WBC: 4.7 10*3/uL (ref 3.4–10.8)

## 2018-02-09 ENCOUNTER — Telehealth: Payer: Self-pay | Admitting: *Deleted

## 2018-02-09 ENCOUNTER — Other Ambulatory Visit: Payer: Self-pay | Admitting: *Deleted

## 2018-02-09 DIAGNOSIS — I10 Essential (primary) hypertension: Secondary | ICD-10-CM

## 2018-02-09 DIAGNOSIS — Z79899 Other long term (current) drug therapy: Secondary | ICD-10-CM

## 2018-02-09 DIAGNOSIS — E785 Hyperlipidemia, unspecified: Secondary | ICD-10-CM

## 2018-02-09 MED ORDER — ATORVASTATIN CALCIUM 80 MG PO TABS: 80.0000 mg | ORAL_TABLET | Freq: Every day | ORAL | 3 refills | Status: DC

## 2018-02-09 NOTE — Telephone Encounter (Signed)
Prior authorization for Torsemide submitted via [email or web address]. Key [id number]. Says should receive a response within 3 business days from [place]. Awaiting determination.

## 2018-02-10 NOTE — Telephone Encounter (Signed)
Per Sunny Schlein at [facility] patient has been approved .  Fax confirmation  To follow   Call [phone] option 5 with any questions

## 2018-02-14 NOTE — Telephone Encounter (Signed)
No answer with pharmacy. Left message that patient's torsemide was approved and if they had any issues running it then to please let us know.

## 2018-03-04 ENCOUNTER — Ambulatory Visit (INDEPENDENT_AMBULATORY_CARE_PROVIDER_SITE_OTHER): Payer: Medicare Other | Admitting: Vascular Surgery

## 2018-03-04 ENCOUNTER — Encounter (INDEPENDENT_AMBULATORY_CARE_PROVIDER_SITE_OTHER): Payer: Self-pay | Admitting: Vascular Surgery

## 2018-03-04 VITALS — BP 140/78 | HR 16 | Resp 16 | Ht 72.0 in | Wt 218.0 lb

## 2018-03-04 DIAGNOSIS — I10 Essential (primary) hypertension: Secondary | ICD-10-CM

## 2018-03-04 DIAGNOSIS — I713 Abdominal aortic aneurysm, ruptured, unspecified: Secondary | ICD-10-CM

## 2018-03-04 DIAGNOSIS — I714 Abdominal aortic aneurysm, without rupture, unspecified: Secondary | ICD-10-CM

## 2018-03-04 DIAGNOSIS — Z720 Tobacco use: Secondary | ICD-10-CM | POA: Diagnosis not present

## 2018-03-04 DIAGNOSIS — I25118 Atherosclerotic heart disease of native coronary artery with other forms of angina pectoris: Secondary | ICD-10-CM

## 2018-03-04 NOTE — Assessment & Plan Note (Addendum)
blood pressure control important in reducing the progression of atherosclerotic disease.  Also risk factor for aneurysm growth.  On appropriate oral medications.

## 2018-03-04 NOTE — Assessment & Plan Note (Signed)
Followed by cardiology.  Multiple previous interventions.  On appropriate medical therapy

## 2018-03-04 NOTE — Patient Instructions (Signed)
Abdominal Aortic Aneurysm Blood pumps away from the heart through tubes (blood vessels) called arteries. Aneurysms are weak or damaged places in the wall of an artery. It bulges out like a balloon. An abdominal aortic aneurysm happens in the main artery of the body (aorta). It can burst or tear, causing bleeding inside the body. This is an emergency. It needs treatment right away. What are the causes? The exact cause is unknown. Things that could cause this problem include:  Fat and other substances building up in the lining of a tube.  Swelling of the walls of a blood vessel.  Certain tissue diseases.  Belly (abdominal) trauma.  An infection in the main artery of the body.  What increases the risk? There are things that make it more likely for you to have an aneurysm. These include:  Being over the age of 74 years old.  Having high blood pressure (hypertension).  Being a male.  Being white.  Being very overweight (obese).  Having a family history of aneurysm.  Using tobacco products.  What are the signs or symptoms? Symptoms depend on the size of the aneurysm and how fast it grows. There may not be symptoms. If symptoms occur, they can include:  Pain (belly, side, lower back, or groin).  Feeling full after eating a small amount of food.  Feeling sick to your stomach (nauseous), throwing up (vomiting), or both.  Feeling a lump in your belly that feels like it is beating (pulsating).  Feeling like you will pass out (faint).  How is this treated?  Medicine to control blood pressure and pain.  Imaging tests to see if the aneurysm gets bigger.  Surgery. How is this prevented? To lessen your chance of getting this condition:  Stop smoking. Stop chewing tobacco.  Limit or avoid alcohol.  Keep your blood pressure, blood sugar, and cholesterol within normal limits.  Eat less salt.  Eat foods low in saturated fats and cholesterol. These are found in animal and  whole dairy products.  Eat more fiber. Fiber is found in whole grains, vegetables, and fruits.  Keep a healthy weight.  Stay active and exercise often.  This information is not intended to replace advice given to you by your health care provider. Make sure you discuss any questions you have with your health care provider. Document Released: 01/16/2013 Document Revised: 02/27/2016 Document Reviewed: 10/21/2012 Elsevier Interactive Patient Education  2017 [employer] Inc.  

## 2018-03-04 NOTE — Assessment & Plan Note (Signed)
Recommend: The patient has an abdominal aortic aneurysm that is 5.0 cm by duplex scan and based on this study it appears that it is suitable for endovascular treatment.  The patient is otherwise in reasonable health.   Therefore, the patient should undergo endovascular repair of the AAA to prevent future leathal rupture.   Patient will require CT angiography of the abdomen and pelvis in order to appropriately plan repair of the AAA.  The risks and benefits as well as the alternative therapies was discussed in detail with the patient. All questions were answered. The patient agrees to move forward the AAA repair and therefore with CT scan.  The patient will follow up with me in the office after the CT scan to review the study or we can call him with results and schedule surgery if this is indeed a greater than 5 cm aneurysm.

## 2018-03-04 NOTE — Progress Notes (Signed)
  Patient ID: Syncere W Glauser, male   DOB: 03/06/1946, 72 y.o.   MRN: [id number]  Chief Complaint  Patient presents with  . New Patient (Initial Visit)    AAA without rupture    HPI Vashon W Radke is a 72 y.o. male.  I am asked to see the patient by Dr. Arida for evaluation of enlarging AAA.  The patient reports having known about this aneurysm for about 5 or 6 years.  When he first started following it it was only about 3-1/2 cm.  At his most recent ultrasound last month, this measured 5.3 cm in maximal diameter.  6 months ago, it was 4.8 cm in maximal diameter.  He denies any clear aneurysm related symptoms. Specifically, the patient denies new back or abdominal pain, or signs of peripheral embolization    Past Medical History:  Diagnosis Date  . AAA (abdominal aortic aneurysm) (HCC)    a. 2012 3.4 cm; b. 07/2017 Abd u/s: Dist abd Ao dil up to 4.8cm - stable compared to 12/2016.  . CAD (coronary artery disease)    a. 2007 Inf MI w/ BMS to RCA x 2; b. 2008 PCI/DES to mid LAD @ [facility]; c. 07/2015 Cath: LAD 100 prox to prev placed stent w/ R->L collats, RCA stents patent. EF 50-55%. Med Rx.  . COPD (chronic obstructive pulmonary disease) (HCC)   . Diastolic dysfunction    a. 02/2014 Echo: EF 55-65%. Gr1 DD, no rwma, mildly dil LA, nl RV fxn.  . ETOH abuse   . History of kidney stones   . Hyperlipidemia   . Hypertension   . MI (myocardial infarction) (HCC)   . Obesity   . Panic disorder   . Tobacco abuse     Past Surgical History:  Procedure Laterality Date  . CARDIAC CATHETERIZATION  08/2006   x1 stent [facility]. Mid RCA: 3.5 x 16 mm overlap with 3.5 x 24 mm Liberte bare-metal stents  . CARDIAC CATHETERIZATION  10/2006   x1 stent [facility]: Mid LAD: 2.5 x 28 mm Cypher drug-eluting  . CARDIAC CATHETERIZATION N/A 07/25/2015   Procedure: Left Heart Cath and Coronary Angiography;  Surgeon: Muhammad A Arida, MD;  Location: [facility];  Service: Cardiovascular;  Laterality: N/A;  .  MICRODISCECTOMY LUMBAR      Family History  Problem Relation Age of Onset  . Hypertension Mother   . Heart disease Father   No bleeding or clotting disorders.  No aneurysms  Social History Social History   Tobacco Use  . Smoking status: Current Every Day Smoker    Packs/day: 2.00    Years: 50.00    Pack years: 100.00    Types: Cigarettes  . Smokeless tobacco: Never Used  Substance Use Topics  . Alcohol use: Yes    Alcohol/week: 50.4 oz    Types: 84 Cans of beer per week    Comment: daily  . Drug use: No     No Known Allergies  Current Outpatient Medications  Medication Sig Dispense Refill  . ALPRAZolam (XANAX) 0.5 MG tablet Take 0.5 mg by mouth 3 (three) times daily as needed for anxiety.    . aspirin EC 81 MG tablet Take 1 tablet (81 mg total) by mouth daily. 90 tablet 3  . atorvastatin (LIPITOR) 80 MG tablet Take 1 tablet (80 mg total) by mouth daily. 90 tablet 3  . enalapril (VASOTEC) 20 MG tablet Take 1 tablet (20 mg total) by mouth 2 (two) times daily. 60   Patient ID: LAMBERTO DINAPOLI, male   DOB: Apr 27, 1946, 72 y.o.   MRN: [id number]  Chief Complaint  Patient presents with  . New Patient (Initial Visit)    AAA without rupture    HPI DALESSANDRO BALDYGA is a 72 y.o. male.  I am asked to see the patient by Dr. Fletcher Anon for evaluation of enlarging AAA.  The patient reports having known about this aneurysm for about 5 or 6 years.  When he first started following it it was only about 3-1/2 cm.  At his most recent ultrasound last month, this measured 5.3 cm in maximal diameter.  6 months ago, it was 4.8 cm in maximal diameter.  He denies any clear aneurysm related symptoms. Specifically, the patient denies new back or abdominal pain, or signs of peripheral embolization    Past Medical History:  Diagnosis Date  . AAA (abdominal aortic aneurysm) ([place])    a. 2012 3.4 cm; b. 07/2017 Abd u/s: Dist abd Ao dil up to 4.8cm - stable compared to 12/2016.  Marland Kitchen CAD (coronary artery disease)    a. 2007 Inf MI w/ BMS to RCA x 2; b. 2008 PCI/DES to mid LAD @ [place]; c. 07/2015 Cath: LAD 100 prox to prev placed stent w/ R->L collats, RCA stents patent. EF 50-55%. Med Rx.  Marland Kitchen COPD (chronic obstructive pulmonary disease) ([place])   . Diastolic dysfunction    a. 02/2014 Echo: EF 55-65%. Gr1 DD, no rwma, mildly dil LA, nl RV fxn.  Marland Kitchen ETOH abuse   . History of kidney stones   . Hyperlipidemia   . Hypertension   . MI (myocardial infarction) ([place])   . Obesity   . Panic disorder   . Tobacco abuse     Past Surgical History:  Procedure Laterality Date  . CARDIAC CATHETERIZATION  08/2006   x1 stent [facility]. Mid RCA: 3.5 x 16 mm overlap with 3.5 x 24 mm Liberte bare-metal stents  . CARDIAC CATHETERIZATION  10/2006   x1 stent [facility]: Mid LAD: 2.5 x 28 mm Cypher drug-eluting  . CARDIAC CATHETERIZATION N/A 07/25/2015   Procedure: Left Heart Cath and Coronary Angiography;  Surgeon: Wellington Hampshire, MD;  Location: [place] [facility];  Service: Cardiovascular;  Laterality: N/A;  .  MICRODISCECTOMY LUMBAR      Family History  Problem Relation Age of Onset  . Hypertension Mother   . Heart disease Father   No bleeding or clotting disorders.  No aneurysms  Social History Social History   Tobacco Use  . Smoking status: Current Every Day Smoker    Packs/day: 2.00    Years: 50.00    Pack years: 100.00    Types: Cigarettes  . Smokeless tobacco: Never Used  Substance Use Topics  . Alcohol use: Yes    Alcohol/week: 50.4 oz    Types: 84 Cans of beer per week    Comment: daily  . Drug use: No     No Known Allergies  Current Outpatient Medications  Medication Sig Dispense Refill  . ALPRAZolam (XANAX) 0.5 MG tablet Take 0.5 mg by mouth 3 (three) times daily as needed for anxiety.    Marland Kitchen aspirin EC 81 MG tablet Take 1 tablet (81 mg total) by mouth daily. 90 tablet 3  . atorvastatin (LIPITOR) 80 MG tablet Take 1 tablet (80 mg total) by mouth daily. 90 tablet 3  . enalapril (VASOTEC) 20 MG tablet Take 1 tablet (20 mg total) by mouth 2 (two) times daily. [place]    Patient ID: Syncere W Glauser, male   DOB: 03/06/1946, 72 y.o.   MRN: [id number]  Chief Complaint  Patient presents with  . New Patient (Initial Visit)    AAA without rupture    HPI Vashon W Radke is a 72 y.o. male.  I am asked to see the patient by Dr. Arida for evaluation of enlarging AAA.  The patient reports having known about this aneurysm for about 5 or 6 years.  When he first started following it it was only about 3-1/2 cm.  At his most recent ultrasound last month, this measured 5.3 cm in maximal diameter.  6 months ago, it was 4.8 cm in maximal diameter.  He denies any clear aneurysm related symptoms. Specifically, the patient denies new back or abdominal pain, or signs of peripheral embolization    Past Medical History:  Diagnosis Date  . AAA (abdominal aortic aneurysm) (HCC)    a. 2012 3.4 cm; b. 07/2017 Abd u/s: Dist abd Ao dil up to 4.8cm - stable compared to 12/2016.  . CAD (coronary artery disease)    a. 2007 Inf MI w/ BMS to RCA x 2; b. 2008 PCI/DES to mid LAD @ [facility]; c. 07/2015 Cath: LAD 100 prox to prev placed stent w/ R->L collats, RCA stents patent. EF 50-55%. Med Rx.  . COPD (chronic obstructive pulmonary disease) (HCC)   . Diastolic dysfunction    a. 02/2014 Echo: EF 55-65%. Gr1 DD, no rwma, mildly dil LA, nl RV fxn.  . ETOH abuse   . History of kidney stones   . Hyperlipidemia   . Hypertension   . MI (myocardial infarction) (HCC)   . Obesity   . Panic disorder   . Tobacco abuse     Past Surgical History:  Procedure Laterality Date  . CARDIAC CATHETERIZATION  08/2006   x1 stent [facility]. Mid RCA: 3.5 x 16 mm overlap with 3.5 x 24 mm Liberte bare-metal stents  . CARDIAC CATHETERIZATION  10/2006   x1 stent [facility]: Mid LAD: 2.5 x 28 mm Cypher drug-eluting  . CARDIAC CATHETERIZATION N/A 07/25/2015   Procedure: Left Heart Cath and Coronary Angiography;  Surgeon: Muhammad A Arida, MD;  Location: [facility];  Service: Cardiovascular;  Laterality: N/A;  .  MICRODISCECTOMY LUMBAR      Family History  Problem Relation Age of Onset  . Hypertension Mother   . Heart disease Father   No bleeding or clotting disorders.  No aneurysms  Social History Social History   Tobacco Use  . Smoking status: Current Every Day Smoker    Packs/day: 2.00    Years: 50.00    Pack years: 100.00    Types: Cigarettes  . Smokeless tobacco: Never Used  Substance Use Topics  . Alcohol use: Yes    Alcohol/week: 50.4 oz    Types: 84 Cans of beer per week    Comment: daily  . Drug use: No     No Known Allergies  Current Outpatient Medications  Medication Sig Dispense Refill  . ALPRAZolam (XANAX) 0.5 MG tablet Take 0.5 mg by mouth 3 (three) times daily as needed for anxiety.    . aspirin EC 81 MG tablet Take 1 tablet (81 mg total) by mouth daily. 90 tablet 3  . atorvastatin (LIPITOR) 80 MG tablet Take 1 tablet (80 mg total) by mouth daily. 90 tablet 3  . enalapril (VASOTEC) 20 MG tablet Take 1 tablet (20 mg total) by mouth 2 (two) times daily. 60   Patient ID: LAMBERTO DINAPOLI, male   DOB: Apr 27, 1946, 72 y.o.   MRN: [id number]  Chief Complaint  Patient presents with  . New Patient (Initial Visit)    AAA without rupture    HPI DALESSANDRO BALDYGA is a 72 y.o. male.  I am asked to see the patient by Dr. Fletcher Anon for evaluation of enlarging AAA.  The patient reports having known about this aneurysm for about 5 or 6 years.  When he first started following it it was only about 3-1/2 cm.  At his most recent ultrasound last month, this measured 5.3 cm in maximal diameter.  6 months ago, it was 4.8 cm in maximal diameter.  He denies any clear aneurysm related symptoms. Specifically, the patient denies new back or abdominal pain, or signs of peripheral embolization    Past Medical History:  Diagnosis Date  . AAA (abdominal aortic aneurysm) ([place])    a. 2012 3.4 cm; b. 07/2017 Abd u/s: Dist abd Ao dil up to 4.8cm - stable compared to 12/2016.  Marland Kitchen CAD (coronary artery disease)    a. 2007 Inf MI w/ BMS to RCA x 2; b. 2008 PCI/DES to mid LAD @ [place]; c. 07/2015 Cath: LAD 100 prox to prev placed stent w/ R->L collats, RCA stents patent. EF 50-55%. Med Rx.  Marland Kitchen COPD (chronic obstructive pulmonary disease) ([place])   . Diastolic dysfunction    a. 02/2014 Echo: EF 55-65%. Gr1 DD, no rwma, mildly dil LA, nl RV fxn.  Marland Kitchen ETOH abuse   . History of kidney stones   . Hyperlipidemia   . Hypertension   . MI (myocardial infarction) ([place])   . Obesity   . Panic disorder   . Tobacco abuse     Past Surgical History:  Procedure Laterality Date  . CARDIAC CATHETERIZATION  08/2006   x1 stent [facility]. Mid RCA: 3.5 x 16 mm overlap with 3.5 x 24 mm Liberte bare-metal stents  . CARDIAC CATHETERIZATION  10/2006   x1 stent [facility]: Mid LAD: 2.5 x 28 mm Cypher drug-eluting  . CARDIAC CATHETERIZATION N/A 07/25/2015   Procedure: Left Heart Cath and Coronary Angiography;  Surgeon: Wellington Hampshire, MD;  Location: [place] [facility];  Service: Cardiovascular;  Laterality: N/A;  .  MICRODISCECTOMY LUMBAR      Family History  Problem Relation Age of Onset  . Hypertension Mother   . Heart disease Father   No bleeding or clotting disorders.  No aneurysms  Social History Social History   Tobacco Use  . Smoking status: Current Every Day Smoker    Packs/day: 2.00    Years: 50.00    Pack years: 100.00    Types: Cigarettes  . Smokeless tobacco: Never Used  Substance Use Topics  . Alcohol use: Yes    Alcohol/week: 50.4 oz    Types: 84 Cans of beer per week    Comment: daily  . Drug use: No     No Known Allergies  Current Outpatient Medications  Medication Sig Dispense Refill  . ALPRAZolam (XANAX) 0.5 MG tablet Take 0.5 mg by mouth 3 (three) times daily as needed for anxiety.    Marland Kitchen aspirin EC 81 MG tablet Take 1 tablet (81 mg total) by mouth daily. 90 tablet 3  . atorvastatin (LIPITOR) 80 MG tablet Take 1 tablet (80 mg total) by mouth daily. 90 tablet 3  . enalapril (VASOTEC) 20 MG tablet Take 1 tablet (20 mg total) by mouth 2 (two) times daily. [place]    Patient ID: Michale W Kruer, male   DOB: 10/06/1945, 71 y.o.   MRN: [id number]  Chief Complaint  Patient presents with  . New Patient (Initial Visit)    AAA without rupture    HPI Shadow W Garduno is a 72 y.o. male.  I am asked to see the patient by Dr. Arida for evaluation of enlarging AAA.  The patient reports having known about this aneurysm for about 5 or 6 years.  When he first started following it it was only about 3-1/2 cm.  At his most recent ultrasound last month, this measured 5.3 cm in maximal diameter.  6 months ago, it was 4.8 cm in maximal diameter.  He denies any clear aneurysm related symptoms. Specifically, the patient denies new back or abdominal pain, or signs of peripheral embolization    Past Medical History:  Diagnosis Date  . AAA (abdominal aortic aneurysm) (HCC)    a. 2012 3.4 cm; b. 07/2017 Abd u/s: Dist abd Ao dil up to 4.8cm - stable compared to 12/2016.  . CAD (coronary artery disease)    a. 2007 Inf MI w/ BMS to RCA x 2; b. 2008 PCI/DES to mid LAD @ [facility]; c. 07/2015 Cath: LAD 100 prox to prev placed stent w/ R->L collats, RCA stents patent. EF 50-55%. Med Rx.  . COPD (chronic obstructive pulmonary disease) (HCC)   . Diastolic dysfunction    a. 02/2014 Echo: EF 55-65%. Gr1 DD, no rwma, mildly dil LA, nl RV fxn.  . ETOH abuse   . History of kidney stones   . Hyperlipidemia   . Hypertension   . MI (myocardial infarction) (HCC)   . Obesity   . Panic disorder   . Tobacco abuse     Past Surgical History:  Procedure Laterality Date  . CARDIAC CATHETERIZATION  08/2006   x1 stent [facility]. Mid RCA: 3.5 x 16 mm overlap with 3.5 x 24 mm Liberte bare-metal stents  . CARDIAC CATHETERIZATION  10/2006   x1 stent [facility]: Mid LAD: 2.5 x 28 mm Cypher drug-eluting  . CARDIAC CATHETERIZATION N/A 07/25/2015   Procedure: Left Heart Cath and Coronary Angiography;  Surgeon: Muhammad A Arida, MD;  Location: [facility];  Service: Cardiovascular;  Laterality: N/A;  .  MICRODISCECTOMY LUMBAR      Family History  Problem Relation Age of Onset  . Hypertension Mother   . Heart disease Father   No bleeding or clotting disorders.  No aneurysms  Social History Social History   Tobacco Use  . Smoking status: Current Every Day Smoker    Packs/day: 2.00    Years: 50.00    Pack years: 100.00    Types: Cigarettes  . Smokeless tobacco: Never Used  Substance Use Topics  . Alcohol use: Yes    Alcohol/week: 50.4 oz    Types: 84 Cans of beer per week    Comment: daily  . Drug use: No     No Known Allergies  Current Outpatient Medications  Medication Sig Dispense Refill  . ALPRAZolam (XANAX) 0.5 MG tablet Take 0.5 mg by mouth 3 (three) times daily as needed for anxiety.    . aspirin EC 81 MG tablet Take 1 tablet (81 mg total) by mouth daily. 90 tablet 3  . atorvastatin (LIPITOR) 80 MG tablet Take 1 tablet (80 mg total) by mouth daily. 90 tablet 3  . enalapril (VASOTEC) 20 MG tablet Take 1 tablet (20 mg total) by mouth 2 (two) times daily. 60

## 2018-03-04 NOTE — Assessment & Plan Note (Signed)
This represents 1 of the risk factors for aneurysm growth.  No interest in smoking cessation.

## 2018-03-06 NOTE — Progress Notes (Deleted)
Cardiology Office Note Date:  03/06/2018  Patient ID:  Bobby Sandoval, Bobby Sandoval 09/23/1946, MRN [id number] PCP:  Gracelyn Nurse, MD  Cardiologist:  Dr. Kirke Corin, MD  ***refresh   Chief Complaint: Follow up  History of Present Illness: Bobby Sandoval is a 72 y.o. male with history of CAD s/p prior LAD and RCA stenting, stable AAA by ultrasound in 07/2017, diastolic dysfunction, COPD, ongoing tobacco abuse at 2 packs daily and ongoing alcohol abuse at a 12-pack daily, HTN, HLD, and obesity who presents for follow up of acute and chronic diastolic CHF.   Patient previously was admitted with an inferior MI in 2007 with PCI/BMS x 2 to the mid RCA followed by subsequent PCI/DES to the mid LAD in 2008. He underwent diagnostic LHC in 07/2015 that showed an occluded mid LAD proximal to the previously placed stent with diffuse disease and right-to-left collaterals. The RCA stents were patent. EF estimated at 50-55%. He was medically managed. He was seen in the office on 01/13/2018 for evaluation of increasing lower extremity swelling. Prior to that visit, he had not been seen in the office since 06/2016. His lower extremity swelling had been increasing since at least 03/2017. There was also associated weight gain of approximately 13 pounds from 03/2017 to his office visit with his PCP in 10/2017 (210--223 pounds). At his office visit with [place] on 01/13/18 he weighed 223 pounds and was noted to have chronic DOE. He reported a fairly sedentary lifestyle and spent most of his time snacking on salty foods and watching TV. He was also noted to have self-discontinued his metoprolol given heart rates in the 50s bpm. He was started on Lasix 40 mg bid x 3 days, followed by 40 mg daily thereafter. He was also scheduled for a follow up abdominal ultrasound to evaluate his AAA which showed the largest aortic measurement was 5.3 cm with the previous measurement being 4.8 cm obtained in 07/2017.  He was referred to vascular surgery,  who saw the patient on 5/31 with plans for endovascular repair. CTA abdomen/pelvis is pending. Labs checked on 4/11 showed a BNP of 85.5, unremarkable CBC, BUN/SCr 6/0.75, K+ 4.6, normal LFT. TTE 01/27/2018 showed EF 60-65%, normal wall motion, Gr1DD, mild MR, mildly dilated LA, RVSF normal, PASP normal. He was seen 5/2 and was doing well. He felt like his lower extremity swelling was about the same. His weight was down 3 pounds to 220 pounds. He continued to drink a 12-pack of beer daily, stating, "I take care of my beer." He also continued to smoke and was not interested in quitting either tobacco or alcohol. It was felt his beer intake was contributing to his fluid retention. He was changed to torsemide 20 mg daily. Renal function 5/2 was stable at 0.87. He was also noted to have an LDL of 108 (goal < 70). He had previously tried Lipitor and Crestor though could not tolerate these secondary to myalgias.   ***   Past Medical History:  Diagnosis Date  . AAA (abdominal aortic aneurysm) (HCC)    a. 2012 3.4 cm; b. 07/2017 Abd u/s: Dist abd Ao dil up to 4.8cm - stable compared to 12/2016.  Marland Kitchen CAD (coronary artery disease)    a. 2007 Inf MI w/ BMS to RCA x 2; b. 2008 PCI/DES to mid LAD @ [facility]; c. 07/2015 Cath: LAD 100 prox to prev placed stent w/ R->L collats, RCA stents patent. EF 50-55%. Med Rx.  Marland Kitchen COPD (chronic obstructive pulmonary  disease) (HCC)   . Diastolic dysfunction    a. 02/2014 Echo: EF 55-65%. Gr1 DD, no rwma, mildly dil LA, nl RV fxn.  Marland Kitchen ETOH abuse   . History of kidney stones   . Hyperlipidemia   . Hypertension   . MI (myocardial infarction) (HCC)   . Obesity   . Panic disorder   . Tobacco abuse     Past Surgical History:  Procedure Laterality Date  . CARDIAC CATHETERIZATION  08/2006   x1 stent [facility]. Mid RCA: 3.5 x 16 mm overlap with 3.5 x 24 mm Liberte bare-metal stents  . CARDIAC CATHETERIZATION  10/2006   x1 stent [facility]: Mid LAD: 2.5 x 28 mm Cypher drug-eluting  . CARDIAC  CATHETERIZATION N/A 07/25/2015   Procedure: Left Heart Cath and Coronary Angiography;  Surgeon: Iran Ouch, MD;  Location: [facility];  Service: Cardiovascular;  Laterality: N/A;  . MICRODISCECTOMY LUMBAR      No outpatient medications have been marked as taking for the 03/08/18 encounter (Appointment) with Sondra Barges, PA-C.    Allergies:   Patient has no known allergies.   Social History:  The patient  reports that he has been smoking cigarettes.  He has a 100.00 pack-year smoking history. He has never used smokeless tobacco. He reports that he drinks about 50.4 oz of alcohol per week. He reports that he does not use drugs.   Family History:  The patient's family history includes Heart disease in his father; Hypertension in his mother.  ROS:   ROS   PHYSICAL EXAM: *** VS:  There were no vitals taken for this visit. BMI: There is no height or weight on file to calculate BMI.  Physical Exam    EKG:  Was ordered and interpreted by me today. Shows ***  Recent Labs: 01/13/2018: BNP 85.5 02/03/2018: ALT 20; BUN 7; Creatinine, Ser 0.87; Hemoglobin 16.7; Platelets 180; Potassium 4.7; Sodium 138  02/03/2018: Chol/HDL Ratio 3.3; Cholesterol, Total 174; HDL 53; LDL Calculated 108; Triglycerides 64   CrCl cannot be calculated (Patient's most recent lab result is older than the maximum 21 days allowed.).   Wt Readings from Last 3 Encounters:  03/04/18 218 lb (98.9 kg)  02/03/18 220 lb (99.8 kg)  01/13/18 223 lb 12 oz (101.5 kg)     Other studies reviewed: Additional studies/records reviewed today include: summarized above  ASSESSMENT AND PLAN:  1. ***  Disposition: F/u with *** in   Current medicines are reviewed at length with the patient today.  The patient did not have any concerns regarding medicines.  Signed, Eula Listen, PA-C 03/06/2018 6:03 PM     [facility] -  [address] [place], [place] [address] [phone]

## 2018-03-08 ENCOUNTER — Ambulatory Visit: Payer: Medicare Other | Admitting: Physician Assistant

## 2018-03-17 ENCOUNTER — Ambulatory Visit
Admission: RE | Admit: 2018-03-17 | Discharge: 2018-03-17 | Disposition: A | Discharge status: Home / Self Care | Payer: Medicare Other | Source: Ambulatory Visit | Attending: Vascular Surgery | Admitting: Vascular Surgery

## 2018-03-17 DIAGNOSIS — K5730 Diverticulosis of large intestine without perforation or abscess without bleeding: Secondary | ICD-10-CM | POA: Insufficient documentation

## 2018-03-17 DIAGNOSIS — K760 Fatty (change of) liver, not elsewhere classified: Secondary | ICD-10-CM | POA: Diagnosis not present

## 2018-03-17 DIAGNOSIS — I723 Aneurysm of iliac artery: Secondary | ICD-10-CM | POA: Insufficient documentation

## 2018-03-17 DIAGNOSIS — I713 Abdominal aortic aneurysm, ruptured, unspecified: Secondary | ICD-10-CM

## 2018-03-17 DIAGNOSIS — I714 Abdominal aortic aneurysm, without rupture: Secondary | ICD-10-CM | POA: Diagnosis not present

## 2018-03-17 MED ORDER — IOPAMIDOL (ISOVUE-370) INJECTION 76%
100.0000 mL | Freq: Once | INTRAVENOUS | Status: AC | PRN
  Administered 2018-03-17: 100 mL via INTRAVENOUS

## 2018-03-21 ENCOUNTER — Telehealth: Payer: Self-pay | Admitting: Physician Assistant

## 2018-03-21 NOTE — Telephone Encounter (Signed)
   [facility] Pre-operative Risk Assessment    Request for surgical clearance:  1. What type of surgery is being performed? Endovascular AAA repair  2. When is this surgery scheduled? Not listed   3. What type of clearance is required (medical clearance vs. Pharmacy clearance to hold med vs. Both)? Not listed  4. Are there any medications that need to be held prior to surgery and how long? Not listed  5. Practice name and name of physician performing surgery? [facility],Dr. Lucky Cowboy  6. What is your office phone number [phone]   7.   What is your office fax number [phone]  8.   Anesthesia type (None, local, MAC, general) ? Not listed   Bobby Sandoval 03/21/2018, 11:52 AM  _________________________________________________________________   (provider comments below)

## 2018-03-21 NOTE — Telephone Encounter (Signed)
Patient needs f/u with Dr Harlon FlorArida, Ryan or [place].  Looks like Alycia RossettiRyan has some openings around 7/18.  He was supposed to have 1 month f/u and cancelled.

## 2018-03-24 NOTE — Telephone Encounter (Signed)
Patient last seen 02/2018 by Alycia Rossettiyan. Patient of Dr Kirke CorinARida. Routing to Dr Kirke CorinArida.

## 2018-03-24 NOTE — Telephone Encounter (Signed)
Pt scheduled for 04/27/18

## 2018-03-25 ENCOUNTER — Telehealth (INDEPENDENT_AMBULATORY_CARE_PROVIDER_SITE_OTHER): Payer: Self-pay | Admitting: Vascular Surgery

## 2018-03-25 NOTE — Telephone Encounter (Signed)
Called the patient back to let him know that Dr. Wyn Quakerew would have to see him at the 03/29/18 appointment and we would have to get the cardiac clearance from his cardiologist and then we could move forward with getting him scheduled for his surgery.

## 2018-03-25 NOTE — Telephone Encounter (Signed)
Patient said that he doesnt know what is going on in regards to his appt that is scheduled for surgical  clearance in July  and wants to know if Wyn QuakerDew can give him some input about that.

## 2018-03-29 ENCOUNTER — Ambulatory Visit (INDEPENDENT_AMBULATORY_CARE_PROVIDER_SITE_OTHER): Payer: Medicare Other | Admitting: Vascular Surgery

## 2018-03-29 ENCOUNTER — Encounter (INDEPENDENT_AMBULATORY_CARE_PROVIDER_SITE_OTHER): Payer: Self-pay | Admitting: Vascular Surgery

## 2018-03-29 VITALS — BP 153/70 | HR 60 | Resp 16 | Ht 72.0 in | Wt 216.8 lb

## 2018-03-29 DIAGNOSIS — I25118 Atherosclerotic heart disease of native coronary artery with other forms of angina pectoris: Secondary | ICD-10-CM

## 2018-03-29 DIAGNOSIS — Z720 Tobacco use: Secondary | ICD-10-CM

## 2018-03-29 DIAGNOSIS — I714 Abdominal aortic aneurysm, without rupture, unspecified: Secondary | ICD-10-CM

## 2018-03-29 DIAGNOSIS — I10 Essential (primary) hypertension: Secondary | ICD-10-CM

## 2018-03-29 NOTE — Progress Notes (Signed)
MRN : [id number]  Bobby Sandoval is a 72 y.o. (22-Mar-1946) male who presents with chief complaint of  Chief Complaint  Patient presents with  . Follow-up    review ct results  .  History of Present Illness: Patient returns today in follow up of his abdominal aortic aneurysm.  I have independently reviewed his CT angiogram and he has an approximately 5.3 x 5.7 cm infrarenal abdominal aortic aneurysm.  This does have suitable anatomy for endovascular repair.  No major changes or problems since his last visit.  Past Medical History:  Diagnosis Date  . AAA (abdominal aortic aneurysm) (HCC)    a. 2012 3.4 cm; b. 07/2017 Abd u/s: Dist abd Ao dil up to 4.8cm - stable compared to 12/2016.  Marland Kitchen CAD (coronary artery disease)    a. 2007 Inf MI w/ BMS to RCA x 2; b. 2008 PCI/DES to mid LAD @ [facility]; c. 07/2015 Cath: LAD 100 prox to prev placed stent w/ R->L collats, RCA stents patent. EF 50-55%. Med Rx.  Marland Kitchen COPD (chronic obstructive pulmonary disease) (HCC)   . Diastolic dysfunction    a. 02/2014 Echo: EF 55-65%. Gr1 DD, no rwma, mildly dil LA, nl RV fxn.  Marland Kitchen ETOH abuse   . History of kidney stones   . Hyperlipidemia   . Hypertension   . MI (myocardial infarction) (HCC)   . Obesity   . Panic disorder   . Tobacco abuse          Past Surgical History:  Procedure Laterality Date  . CARDIAC CATHETERIZATION  08/2006   x1 stent [facility]. Mid RCA: 3.5 x 16 mm overlap with 3.5 x 24 mm Liberte bare-metal stents  . CARDIAC CATHETERIZATION  10/2006   x1 stent [facility]: Mid LAD: 2.5 x 28 mm Cypher drug-eluting  . CARDIAC CATHETERIZATION N/A 07/25/2015   Procedure: Left Heart Cath and Coronary Angiography;  Surgeon: Iran Ouch, MD;  Location: [facility] INVASIVE CV [facility];  Service: Cardiovascular;  Laterality: N/A;  . MICRODISCECTOMY LUMBAR           Family History  Problem Relation Age of Onset  . Hypertension Mother   . Heart disease Father   No bleeding or clotting disorders.  No  aneurysms  Social History Social History        Tobacco Use  . Smoking status: Current Every Day Smoker    Packs/day: 2.00    Years: 50.00    Pack years: 100.00    Types: Cigarettes  . Smokeless tobacco: Never Used  Substance Use Topics  . Alcohol use: Yes    Alcohol/week: 50.4 oz    Types: 84 Cans of beer per week    Comment: daily  . Drug use: No     No Known Allergies        Current Outpatient Medications  Medication Sig Dispense Refill  . ALPRAZolam (XANAX) 0.5 MG tablet Take 0.5 mg by mouth 3 (three) times daily as needed for anxiety.    Marland Kitchen aspirin EC 81 MG tablet Take 1 tablet (81 mg total) by mouth daily. 90 tablet 3  . atorvastatin (LIPITOR) 80 MG tablet Take 1 tablet (80 mg total) by mouth daily. 90 tablet 3  . enalapril (VASOTEC) 20 MG tablet Take 1 tablet (20 mg total) by mouth 2 (two) times daily. 60 tablet 3  . nitroGLYCERIN (NITROSTAT) 0.4 MG SL tablet Place 0.4 mg under the tongue every 5 (five) minutes as needed for chest pain.    Marland Kitchen  potassium chloride (K-DUR) 10 MEQ tablet Take 2 tablets (20 mEq total) by mouth daily. 60 tablet 3  . torsemide (DEMADEX) 20 MG tablet Take 1 tablet (20 mg total) by mouth daily. 90 tablet 3   No current facility-administered medications for this visit.       REVIEW OF SYSTEMS (Negative unless checked)  Constitutional: [] Weight loss  [] Fever  [] Chills Cardiac: [] Chest pain   [] Chest pressure   [] Palpitations   [] Shortness of breath when laying flat   [] Shortness of breath at rest   [x] Shortness of breath with exertion. Vascular:  [] Pain in legs with walking   [] Pain in legs at rest   [] Pain in legs when laying flat   [] Claudication   [] Pain in feet when walking  [] Pain in feet at rest  [] Pain in feet when laying flat   [] History of DVT   [] Phlebitis   [] Swelling in legs   [] Varicose veins   [] Non-healing ulcers Pulmonary:   [] Uses home oxygen   [] Productive cough   [] Hemoptysis   [] Wheeze  [x] COPD    [] Asthma Neurologic:  [] Dizziness  [] Blackouts   [] Seizures   [] History of stroke   [] History of TIA  [] Aphasia   [] Temporary blindness   [] Dysphagia   [] Weakness or numbness in arms   [] Weakness or numbness in legs Musculoskeletal:  [x] Arthritis   [] Joint swelling   [x] Joint pain   [] Low back pain Hematologic:  [] Easy bruising  [] Easy bleeding   [] Hypercoagulable state   [] Anemic  [] Hepatitis Gastrointestinal:  [] Blood in stool   [] Vomiting blood  [] Gastroesophageal reflux/heartburn   [] Abdominal pain Genitourinary:  [] Chronic kidney disease   [] Difficult urination  [] Frequent urination  [] Burning with urination   [] Hematuria Skin:  [] Rashes   [] Ulcers   [] Wounds Psychological:  [x] History of anxiety   []  History of major depression.    Physical Examination  BP (!) 153/70 (BP Location: Right Arm)   Pulse 60   Resp 16   Ht 6' (1.829 m)   Wt 216 lb 12.8 oz (98.3 kg)   BMI 29.40 kg/m  Gen:  WD/WN, NAD Head: [place]/AT, No temporalis wasting. Ear/Nose/Throat: Hearing grossly intact, nares w/o erythema or drainage Eyes: Conjunctiva clear. Sclera non-icteric Neck: Supple.  Trachea midline Pulmonary:  Good air movement, no use of accessory muscles.  Cardiac: RRR, no JVD Vascular:  Vessel Right Left  Radial Palpable Palpable                                   Gastrointestinal: soft, non-tender/non-distended. Increased aortic impulse Musculoskeletal: M/S 5/5 throughout.  No deformity or atrophy. No edema. Neurologic: Sensation grossly intact in extremities.  Symmetrical.  Speech is fluent.  Psychiatric: Judgment intact, Mood & affect appropriate for pt's clinical situation. Dermatologic: No rashes or ulcers noted.  No cellulitis or open wounds.       Labs Recent Results (from the past 2160 hour(s))  Comp Met (CMET)     Status: Abnormal   Collection Time: 01/13/18 10:49 AM  Result Value Ref Range   Glucose 100 (H) 65 - 99 mg/dL   BUN 6 (L) 8 - 27 mg/dL   Creatinine, Ser  2.53 (L) 0.76 - 1.27 mg/dL   GFR calc non Af Amer 91 >59 mL/min/1.73   GFR calc Af Amer 105 >59 mL/min/1.73   BUN/Creatinine Ratio 8 (L) 10 - 24   Sodium 137 134 - 144 mmol/L   Potassium  4.6 3.5 - 5.2 mmol/L   Chloride 99 96 - 106 mmol/L   CO2 22 20 - 29 mmol/L   Calcium 9.2 8.6 - 10.2 mg/dL   Total Protein 6.6 6.0 - 8.5 g/dL   Albumin 4.3 3.5 - 4.8 g/dL   Globulin, Total 2.3 1.5 - 4.5 g/dL   Albumin/Globulin Ratio 1.9 1.2 - 2.2   Bilirubin Total 0.9 0.0 - 1.2 mg/dL   Alkaline Phosphatase 69 39 - 117 IU/L   AST 30 0 - 40 IU/L   ALT 29 0 - 44 IU/L  CBC w/Diff     Status: None   Collection Time: 01/13/18 10:49 AM  Result Value Ref Range   WBC 5.3 3.4 - 10.8 x10E3/uL   RBC 5.19 4.14 - 5.80 x10E6/uL   Hemoglobin 16.6 13.0 - 17.7 g/dL   Hematocrit 13.2 44.0 - 51.0 %   MCV 95 79 - 97 fL   MCH 32.0 26.6 - 33.0 pg   MCHC 33.8 31.5 - 35.7 g/dL   RDW 10.2 72.5 - 36.6 %   Platelets 186 150 - 379 x10E3/uL   Neutrophils 64 Not Estab. %   Lymphs 25 Not Estab. %   Monocytes 9 Not Estab. %   Eos 2 Not Estab. %   Basos 0 Not Estab. %   Neutrophils Absolute 3.3 1.4 - 7.0 x10E3/uL   Lymphocytes Absolute 1.4 0.7 - 3.1 x10E3/uL   Monocytes Absolute 0.5 0.1 - 0.9 x10E3/uL   EOS (ABSOLUTE) 0.1 0.0 - 0.4 x10E3/uL   Basophils Absolute 0.0 0.0 - 0.2 x10E3/uL   Immature Granulocytes 0 Not Estab. %   Immature Grans (Abs) 0.0 0.0 - 0.1 x10E3/uL  B Nat Peptide     Status: None   Collection Time: 01/13/18 10:49 AM  Result Value Ref Range   BNP 85.5 0.0 - 100.0 pg/mL  CBC     Status: None   Collection Time: 02/03/18  9:02 AM  Result Value Ref Range   WBC 4.7 3.4 - 10.8 x10E3/uL   RBC 5.20 4.14 - 5.80 x10E6/uL   Hemoglobin 16.7 13.0 - 17.7 g/dL   Hematocrit 44.0 34.7 - 51.0 %   MCV 92 79 - 97 fL   MCH 32.1 26.6 - 33.0 pg   MCHC 34.9 31.5 - 35.7 g/dL   RDW 42.5 95.6 - 38.7 %   Platelets 180 150 - 379 x10E3/uL  Comprehensive metabolic panel     Status: Abnormal   Collection Time: 02/03/18   9:02 AM  Result Value Ref Range   Glucose 100 (H) 65 - 99 mg/dL   BUN 7 (L) 8 - 27 mg/dL   Creatinine, Ser 5.64 0.76 - 1.27 mg/dL   GFR calc non Af Amer 86 >59 mL/min/1.73   GFR calc Af Amer 99 >59 mL/min/1.73   BUN/Creatinine Ratio 8 (L) 10 - 24   Sodium 138 134 - 144 mmol/L   Potassium 4.7 3.5 - 5.2 mmol/L   Chloride 102 96 - 106 mmol/L   CO2 23 20 - 29 mmol/L   Calcium 9.5 8.6 - 10.2 mg/dL   Total Protein 6.3 6.0 - 8.5 g/dL   Albumin 3.9 3.5 - 4.8 g/dL   Globulin, Total 2.4 1.5 - 4.5 g/dL   Albumin/Globulin Ratio 1.6 1.2 - 2.2   Bilirubin Total 0.7 0.0 - 1.2 mg/dL   Alkaline Phosphatase 70 39 - 117 IU/L   AST 23 0 - 40 IU/L   ALT 20 0 - 44 IU/L  Lipid  panel     Status: Abnormal   Collection Time: 02/03/18  9:02 AM  Result Value Ref Range   Cholesterol, Total 174 100 - 199 mg/dL   Triglycerides 64 0 - 149 mg/dL   HDL 53 >16 mg/dL   VLDL Cholesterol Cal 13 5 - 40 mg/dL   LDL Calculated 109 (H) 0 - 99 mg/dL   Chol/HDL Ratio 3.3 0.0 - 5.0 ratio    Comment:                                   T. Chol/HDL Ratio                                             Men  Women                               1/2 Avg.Risk  3.4    3.3                                   Avg.Risk  5.0    4.4                                2X Avg.Risk  9.6    7.1                                3X Avg.Risk 23.4   11.0     Radiology Ct Angio Abd/pel W/ And/or W/o  Result Date: 03/17/2018 CLINICAL DATA:  Enlarging abdominal aortic aneurysm measuring approximately 5.3 cm in greatest diameter by duplex ultrasound in April. EXAM: CT ANGIOGRAPHY ABDOMEN AND PELVIS WITH CONTRAST AND WITHOUT CONTRAST TECHNIQUE: Multidetector CT imaging of the abdomen and pelvis was performed using the standard protocol during bolus administration of intravenous contrast. Multiplanar reconstructed images and MIPs were obtained and reviewed to evaluate the vascular anatomy. CONTRAST:  ISOVUE-370 IOPAMIDOL (ISOVUE-370) INJECTION 76%  COMPARISON:  Report from a prior aortic duplex ultrasound on 01/31/2018 FINDINGS: VASCULAR Aorta: Fusiform infrarenal abdominal aortic aneurysm present measuring approximately 5.3 x 5.7 cm in greatest axial dimensions (transverse x AP). Irregular mural thrombus present within the aneurysm which does cause significant luminal narrowing of the aneurysm sac just inferior to the IMA origin. Infrarenal aneurysm neck measures approximately 2.3 x 2.5 cm. The aneurysm terminates at the aortic bifurcation. No evidence of aneurysm rupture, dissection or inflammatory aneurysm. Celiac: Normally patent. SMA: Normally patent. The common hepatic artery is replaced off of the SMA. Renals: Bilateral single renal arteries present. No evidence of renal artery stenosis. IMA: Normally patent. Inflow: Bilateral iliac arteries are tortuous, especially the left common iliac and proximal external iliac arteries. 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 The left common iliac artery measures 1.7 cm in greatest caliber. Bilateral internal and external iliac arteries show scattered plaque and normal patency without evidence of aneurysmal disease. Proximal Outflow: The common femoral arteries and femoral bifurcations are normally patent bilaterally.  Veins: Delayed imaging shows no venous abnormalities in the abdomen or pelvis. Review of the MIP images confirms the above findings. NON-VASCULAR Lower chest: No acute abnormality. Hepatobiliary: Hepatic steatosis with potential early cirrhotic morphology of the liver. No hepatic masses identified. There are some areas of increased density within the gallbladder lumen potentially representing gallstones. No evidence of gallbladder inflammation or biliary ductal dilatation. Pancreas: Unremarkable. No pancreatic ductal dilatation or surrounding  inflammatory changes. Spleen: Normal in size without focal abnormality. Adrenals/Urinary Tract: Adrenal glands are unremarkable. Kidneys are normal, without renal calculi, focal lesion, or hydronephrosis. Bladder is unremarkable. Stomach/Bowel: Bowel shows no evidence of obstruction or inflammation. Diverticulosis of the descending and sigmoid colon present without evidence of diverticulitis. No evidence of free air. No abnormal fluid collections. Lymphatic: No enlarged lymph nodes identified in the abdomen or pelvis. Reproductive: Prostate is unremarkable. Other: No abdominal wall hernia or abnormality. No abdominopelvic ascites. Musculoskeletal: No acute or significant osseous findings. IMPRESSION: VASCULAR 1. Fusiform infrarenal abdominal aortic aneurysm measures 5.3 x 5.7 cm in greatest transverse dimensions. The aneurysm sac contains significant mural thrombus, especially just below the IMA origin. No evidence to suggest aneurysm leak or rupture. 2. 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 NON-VASCULAR 1. Probable cholelithiasis. 2. Colonic diverticulosis without evidence of diverticulitis. 3. Hepatic steatosis and possible early cirrhotic morphology of the liver. Electronically Signed   By: Irish Lack M.D.   On: 03/17/2018 17:10    Assessment/Plan Hypertension blood pressure control important in reducing the progression of atherosclerotic disease.  Also risk factor for aneurysm growth.  On appropriate oral medications.   CAD (coronary artery disease) Followed by cardiology.  Multiple previous interventions.  On appropriate medical therapy  Tobacco abuse This represents 1 of the risk factors for aneurysm growth.  No interest in smoking cessation.   AAA (abdominal aortic aneurysm) Recommend: The aneurysm is > 5 cm and therefore should undergo repair. Patient is status post CT scan of the abdominal aorta. The patient is a candidate for  endovascular repair.   He will require cardiac clearance.   The patient will continue antiplatelet therapy as prescribed (since the patient is undergoing endovascular repair as opposed to open repair) as well as aggressive management of hyperlipidemia. Exercise is again strongly encouraged.   The patient is reminded that lifetime routine surveillance is a necessity with an endograft.   The risks and benefits of AAA repair are reviewed with the patient.  All questions are answered.  Alternative therapies are also discussed.  The patient agrees to proceed with endovascular aneurysm repair.  Patient will follow-up with me in the office after the surgery.    Festus Barren, MD  03/29/2018 11:59 AM    This note was created with Dragon medical transcription system.  Any errors from dictation are purely unintentional

## 2018-03-29 NOTE — Assessment & Plan Note (Signed)
Recommend: The aneurysm is > 5 cm and therefore should undergo repair. Patient is status post CT scan of the abdominal aorta. The patient is a candidate for endovascular repair.   He will require cardiac clearance.   The patient will continue antiplatelet therapy as prescribed (since the patient is undergoing endovascular repair as opposed to open repair) as well as aggressive management of hyperlipidemia. Exercise is again strongly encouraged.   The patient is reminded that lifetime routine surveillance is a necessity with an endograft.   The risks and benefits of AAA repair are reviewed with the patient.  All questions are answered.  Alternative therapies are also discussed.  The patient agrees to proceed with endovascular aneurysm repair.  Patient will follow-up with me in the office after the surgery.

## 2018-03-29 NOTE — Patient Instructions (Signed)
Abdominal Aortic Aneurysm Endograft Repair Abdominal aortic aneurysm endograft repair is a surgery to fix an aortic aneurysm in the abdominal area. An aneurysm is a weak or damaged part of an artery wall that bulges out from the normal force of blood pumping through the body. An abdominal aortic aneurysm is an aneurysm that happens in the lower part of the aorta, which is the main artery of the body. The repair is often done if the aneurysm gets so large that it might burst (rupture). A ruptured aneurysm would cause bleeding inside the body that could put a person's life in danger. Before that happens, this procedure is needed to fix the problem. The procedure may also be done if the aneurysm causes symptoms such as pain in the back, abdomen, or side. In this procedure, a tube made of fabric and metal mesh (endograft or stent-graft) is placed in the weak part of the aorta to repair it. Tell a health care provider about:  Any allergies you have.  All medicines you are taking, including vitamins, herbs, eye drops, creams, and over-the-counter medicines.  Any problems you or family members have had with anesthetic medicines.  Any blood disorders you have.  Any surgeries you have had.  Any medical conditions you have.  Whether you are pregnant or may be pregnant. What are the risks? Generally, this is a safe procedure. However, problems may occur, including:  Infection of the graft or incision area.  Bleeding during the procedure or from the incision site.  Allergic reactions to medicines.  Damage to other structures or organs.  Blood leaking out around the endograft.  The endograft moving from where it was placed during surgery.  Blood flow through the graft becoming blocked.  Blood clots.  Kidney problems.  Blood flow to the legs becoming blocked (rare).  Rupture of the aorta even after the endograft repair is a success (rare).  What happens before the procedure? Staying  hydrated Follow instructions from your health care provider about hydration, which may include:  Up to 2 hours before the procedure - you may continue to drink clear liquids, such as water, clear fruit juice, black coffee, and plain tea.  Eating and drinking restrictions Follow instructions from your health care provider about eating and drinking, which may include:  8 hours before the procedure - stop eating heavy meals or foods such as meat, fried foods, or fatty foods.  6 hours before the procedure - stop eating light meals or foods, such as toast or cereal.  6 hours before the procedure - stop drinking milk or drinks that contain milk.  2 hours before the procedure - stop drinking clear liquids.  Medicines  Ask your health care provider about: ? Changing or stopping your regular medicines. This is especially important if you are taking diabetes medicines or blood thinners. ? Taking medicines such as aspirin and ibuprofen. These medicines can thin your blood. Do not take these medicines before your procedure if your health care provider instructs you not to.  You may be given antibiotic medicine to help prevent infection. General instructions  You may need to have blood tests, a test to check heart rhythm (electrocardiogram, or ECG), or a test to check blood flow (angiogram) before the surgery.  Imaging tests will be done to check the size and location of the aneurysm. These tests could include an ultrasound, a CT scan, or an MRI.  Do not use any products that contain nicotine or tobacco-such as cigarettes and   e-cigarettes-for as long as possible before the surgery. If you need help quitting, ask your health care provider.  Ask your health care provider how your surgical site will be marked or identified.  Plan to have someone take you home from the hospital or clinic. What happens during the procedure?  To reduce your risk of infection: ? Your health care team will wash or  sanitize their hands. ? Your skin will be washed with soap. ? Hair may be removed from the surgical area.  An IV tube will be inserted into one of your veins.  You will be given one or more of the following: ? A medicine to help you relax (sedative). ? A medicine to numb the area (local anesthetic). ? A medicine to make you fall asleep (general anesthetic). ? A medicine that is injected into an area of your body to numb everything below the injection site (regional anesthetic).  During the surgery: ? Small incisions or a puncture will be made on one or both sides of the groin. Long, thin tubes (catheters) will be passed through the opening, put into the artery in your thigh, and moved up into the aneurysm in the aorta. ? The health care provider will use live X-ray pictures to guide the endograft through the catheterto the place where the aneurysm is. ? The endograft will be released to seal off the aneurysm and to line the aorta. It will keep blood from flowing into the aneurysm and will help keep it from rupturing. The endograft will stay in place and will not be taken out. ? X-rays will be used to check where the endograft is placed and to make sure that it is where it should be. ? The catheter will be taken out, and the incision will be closed with stitches (sutures). The procedure may vary among health care providers and hospitals. What happens after the procedure?  Your blood pressure, heart rate, breathing rate, and blood oxygen level will be monitored until the medicines you were given have worn off.  You will need to lie flat for a number of hours. Bending your legs can cause them to bleed and swell.  You will then be urged to get up and move around a number of times each day and to slowly become more active.  You will be given medicines to control pain.  Certain tests may be done after your procedure to check how well the endograft is working and to check its placement.  Do  not drive for 24 hours if you received a sedative. This information is not intended to replace advice given to you by your health care provider. Make sure you discuss any questions you have with your health care provider. Document Released: 02/07/2009 Document Revised: 04/10/2016 Document Reviewed: 12/16/2015 [employer] Interactive Patient Education  2018 [employer]  

## 2018-04-21 ENCOUNTER — Ambulatory Visit: Payer: Medicare Other | Admitting: Physician Assistant

## 2018-04-27 ENCOUNTER — Encounter: Payer: Self-pay | Admitting: Nurse Practitioner

## 2018-04-27 ENCOUNTER — Ambulatory Visit (INDEPENDENT_AMBULATORY_CARE_PROVIDER_SITE_OTHER): Payer: Medicare Other | Admitting: Nurse Practitioner

## 2018-04-27 VITALS — BP 118/60 | HR 84 | Ht 72.0 in | Wt 217.0 lb

## 2018-04-27 DIAGNOSIS — Z720 Tobacco use: Secondary | ICD-10-CM | POA: Diagnosis not present

## 2018-04-27 DIAGNOSIS — I714 Abdominal aortic aneurysm, without rupture, unspecified: Secondary | ICD-10-CM

## 2018-04-27 DIAGNOSIS — I5032 Chronic diastolic (congestive) heart failure: Secondary | ICD-10-CM

## 2018-04-27 DIAGNOSIS — I2510 Atherosclerotic heart disease of native coronary artery without angina pectoris: Secondary | ICD-10-CM

## 2018-04-27 DIAGNOSIS — E782 Mixed hyperlipidemia: Secondary | ICD-10-CM

## 2018-04-27 DIAGNOSIS — I10 Essential (primary) hypertension: Secondary | ICD-10-CM

## 2018-04-27 DIAGNOSIS — Z01818 Encounter for other preprocedural examination: Secondary | ICD-10-CM | POA: Diagnosis not present

## 2018-04-27 NOTE — Progress Notes (Signed)
Office Visit    Patient Name: Bobby Sandoval Date of Encounter: 04/27/2018  Primary Care Provider:  Gracelyn Nurse, MD Primary Cardiologist:  Lorine Bears, MD  Chief Complaint    72 year old male with a history of CAD status post prior RCA and LAD stenting, abdominal aortic aneurysm, HFpEF, alcohol abuse, COPD, hypertension, hyperlipidemia, tobacco abuse, and obesity, who presents for follow-up for preoperative evaluation-pending endovascular repair of AAA.  Past Medical History    Past Medical History:  Diagnosis Date  . AAA (abdominal aortic aneurysm) (HCC)    a. 2012 3.4 cm; b. 07/2017 Abd u/s: Dist abd Ao dil up to 4.8cm; c. 03/2018 CT Abd: infrarenal AAA 5.3 x 5.7cm.  Marland Kitchen CAD (coronary artery disease)    a. 2007 Inf MI w/ BMS to RCA x 2; b. 2008 PCI/DES to mid LAD @ [facility]; c. 07/2015 Cath: LAD 100 prox to prev placed stent w/ R->L collats, RCA stents patent. EF 50-55%. Med Rx.  Marland Kitchen COPD (chronic obstructive pulmonary disease) (HCC)   . Diastolic dysfunction    a. 02/2014 Echo: EF 55-65%. Gr1 DD, no rwma, mildly dil LA, nl RV fxn; b. 01/2018 Echo: EF 60-65%, Gr1 DD, mild MR, mildly dil LA. nl RV fxn. Nl PASP.  Marland Kitchen ETOH abuse   . Hepatic steatosis    a. 03/2018 CT Abd: hepatic steatosis and possible early cirrhotic morphology of liver.  Marland Kitchen History of kidney stones   . Hyperlipidemia   . Hypertension   . MI (myocardial infarction) (HCC)   . Obesity   . Panic disorder   . Tobacco abuse    Past Surgical History:  Procedure Laterality Date  . CARDIAC CATHETERIZATION  08/2006   x1 stent [facility]. Mid RCA: 3.5 x 16 mm overlap with 3.5 x 24 mm Liberte bare-metal stents  . CARDIAC CATHETERIZATION  10/2006   x1 stent [facility]: Mid LAD: 2.5 x 28 mm Cypher drug-eluting  . CARDIAC CATHETERIZATION N/A 07/25/2015   Procedure: Left Heart Cath and Coronary Angiography;  Surgeon: Iran Ouch, MD;  Location: [facility] INVASIVE CV LAB;  Service: Cardiovascular;  Laterality: N/A;  . MICRODISCECTOMY LUMBAR       Allergies  No Known Allergies  History of Present Illness    72 year old male with the above past medical history including CAD status post prior inferior myocardial infarction requiring bare-metal stenting x2 to the mid RCA in 2007.  He subsequently underwent PCI drug-eluting stent placement to the mid LAD in 2008.  In October 2016, he underwent diagnostic catheterization revealing an occluded mid LAD proximal to the previously placed stent with diffuse disease and right to left collaterals.  RCA stents were patent.  EF is 50 to 55%.  Medical therapy was recommended.  Other history includes ongoing tobacco abuse, alcohol abuse, hypertension, hyperlipidemia, COPD, and abdominal aortic aneurysm.  I saw him in clinic in April due to complaints of increasing lower extremity edema with weight gain and dyspnea on exertion.  I placed him on Lasix 40 mg daily and obtain an echocardiogram which showed normal LV function with grade 1 diastolic dysfunction.  There were no significant valvular abnormalities.  He followed up in May and reported no significant change in symptoms.  His weight was only down a few pounds.  He was switched to torsemide 20 mg daily.  He was to follow-up in June but did not.  In the interim, he did follow-up with vascular surgery in late June after CT on June 13 showed  increased diameter of infrarenal abdominal aortic aneurysm-now 5.3 x 5.7 cm.  He is now pending endovascular repair.  From a cardiac standpoint, he has been relatively stable.  He does not experience chest pain but does have dyspnea on exertion which he says has not changed much since being placed on diuretic therapy.  Overall, exercise tolerance remains poor and he does not routine exercise.  He blames this on ongoing tobacco abuse and COPD.  He has had improvement in lower extremity edema since being switched to torsemide.  He did have follow-up labs on July 16 which showed normal renal function and electrolytes.  He  denies PND, orthopnea, dizziness, syncope, or early satiety.  Home Medications    Prior to Admission medications   Medication Sig Start Date End Date Taking? Authorizing Provider  ALPRAZolam Prudy Feeler) 0.5 MG tablet Take 0.5 mg by mouth 3 (three) times daily as needed for anxiety.   Yes [provider]  aspirin EC 81 MG tablet Take 1 tablet (81 mg total) by mouth daily. 07/08/15  Yes Iran Ouch, MD  atorvastatin (LIPITOR) 80 MG tablet Take 1 tablet (80 mg total) by mouth daily. 02/09/18 05/10/18 Yes Dunn, Ryan M, PA-C  enalapril (VASOTEC) 20 MG tablet Take 1 tablet (20 mg total) by mouth 2 (two) times daily. 10/30/15  Yes Iran Ouch, MD  nitroGLYCERIN (NITROSTAT) 0.4 MG SL tablet Place 0.4 mg under the tongue every 5 (five) minutes as needed for chest pain.   Yes [provider]  potassium chloride (K-DUR) 10 MEQ tablet Take 2 tablets (20 mEq total) by mouth daily. 01/27/18  Yes Creig Hines, NP  torsemide (DEMADEX) 20 MG tablet Take 1 tablet (20 mg total) by mouth daily. 02/03/18 05/04/18 Yes Dunn, Raymon Mutton, PA-C    Review of Systems    He has chronic dyspnea on exertion which limits his activity.  He does not routine exercise.  He denies chest pain, palpitations, PND, orthopnea, dizziness, syncope, or early satiety.  Edema has improved since being placed on torsemide.  All other systems reviewed and are otherwise negative except as noted above.  Physical Exam    VS:  BP 118/60 (BP Location: Left Arm, Patient Position: Sitting, Cuff Size: Normal)   Pulse 84   Ht 6' (1.829 m)   Wt 217 lb (98.4 kg)   BMI 29.43 kg/m  , BMI Body mass index is 29.43 kg/m. GEN: Well nourished, well developed, in no acute distress.  HEENT: normal.  Neck: Supple, no JVD, carotid bruits, or masses. Cardiac: RRR, no murmurs, rubs, or gallops. No clubbing, cyanosis, trace bilateral lower extremity edema.  Radials/DP/PT 2+ and equal bilaterally.  Respiratory:  Respirations regular  and unlabored, clear to auscultation bilaterally. GI: Semifirm, protuberant.  Nontender, BS + x 4. MS: no deformity or atrophy. Skin: warm and dry, no rash. Neuro:  Strength and sensation are intact. Psych: Normal affect.  Accessory Clinical Findings    ECG -sinus arrhythmia, 84, no acute ST or T changes.  Assessment & Plan    1.  Coronary artery disease/preoperative cardiovascular evaluation: Status post prior inferior MI with RCA stenting in 2007 and subsequent LAD stenting in 2008 with finding of occluded LAD in October 2016.  He does have right to left collaterals and has been medically managed.  He does not experience chest pain but does have poor exercise tolerance and has chronic dyspnea on exertion.  Recent echo showed normal LV function.  He has not seen any  change in his dyspnea on exertion after being placed on diuretic therapy though his weight is down and edema improved some.  He is now pending endovascular repair of an abdominal aortic aneurysm and requires cardiac clearance.  Given poor exercise tolerance with worsening dyspnea and edema earlier this year, I have recommended a Lexiscan Myoview and he is agreeable to proceed.  Provided that this is low risk, he would be okay to proceed with surgery.  He should remain on aspirin and statin therapy throughout the perioperative period.  2.  Abdominal aortic aneurysm: Pending endovascular repair.  See above.  Stress test pending.  3.  Essential hypertension: Stable on ACE inhibitor therapy.  4.  HFpEF: Weight is down 6 pounds since April.  His volume does look better on examination.  Labs looked fine earlier this month.  Continue current dose of torsemide and potassium.  Heart rate and blood pressure stable.  5.  Hyperlipidemia: He remains on atorvastatin therapy.  This is followed by his PCP.  6.  Tobacco abuse: Still smoking 2 packs a day.  He is not interested in quitting.  Cessation advised.  7.  Alcohol abuse: Still drinking  at least a 12 pack of beer a day.  He is not planning on quitting or cutting back.  We discussed how recent CT showed hepatic steatosis and possibly early signs of cirrhosis.  Recent LFTs were within normal limits.  8.  Disposition: Follow-up stress testing.  Follow-up in clinic in 6 months or sooner if necessary.   Nicolasa Duckinghristopher Abbye Lao, NP 04/27/2018, 1:26 PM

## 2018-04-27 NOTE — Patient Instructions (Signed)
Medication Instructions: Your physician recommends that you continue on your current medications as directed. Please refer to the Current Medication list given to you today.  If you need a refill on your cardiac medications before your next appointment, please call your pharmacy.   Procedures/Testing: Your physician has requested that you have a lexiscan myoview. For further information please visit [email or web address]. Please follow instruction sheet, as given.   Follow-Up: Your physician wants you to follow-up in 6 months with Dr. Kirke CorinArida. You will receive a reminder letter in the mail two months in advance. If you don't receive a letter, please call our office at [phone] to schedule this follow-up appointment.   [facility] you [facility]!     [facility] MYOVIEW  Your provider has ordered a Stress Test with nuclear imaging. The purpose of this test is to evaluate the blood supply to your heart muscle. This procedure is referred to as a "Non-Invasive Stress Test." This is because other than having an IV started in your vein, nothing is inserted or "invades" your body. Cardiac stress tests are done to find areas of poor blood flow to the heart by determining the extent of coronary artery disease (CAD). Some patients exercise on a treadmill, which naturally increases the blood flow to your heart, while others who are unable to walk on a treadmill due to physical limitations have a pharmacologic/chemical stress agent called Lexiscan . This medicine will mimic walking on a treadmill by temporarily increasing your coronary blood flow.   Please note: these test may take anywhere between 2-4 hours to complete  PLE[facility] REPORT TO [facility]  THE VOLUNTEERS AT THE FIRST DESK WILL DIRECT YOU WHERE TO GO  Date of Procedure:_____________________________________  Arrival Time for Procedure:______________________________  Instructions regarding medication:  None to  hold  PLEASE NOTIFY THE OFFICE AT LEAST 24 HOURS IN ADVANCE IF YOU ARE UNABLE TO KEEP YOUR APPOINTMENT.  [phone] AND  PLEASE NOTIFY NUCLEAR MEDICINE AT [facility] AT LEAST 24 HOURS IN ADVANCE IF YOU ARE UNABLE TO KEEP YOUR APPOINTMENT. [phone]  How to prepare for your Myoview test:  1. Do not eat or drink after midnight 2. No caffeine for 24 hours prior to test 3. No smoking 24 hours prior to test. 4. Your medication may be taken with water.  If your doctor stopped a medication because of this test, do not take that medication. 5. Ladies, please do not wear dresses.  Skirts or pants are appropriate. Please wear a short sleeve shirt. 6. No perfume, cologne or lotion. 7. Wear comfortable walking shoes. No heels!

## 2018-05-04 ENCOUNTER — Encounter
Admission: RE | Admit: 2018-05-04 | Discharge: 2018-05-04 | Disposition: A | Discharge status: Home / Self Care | Payer: Medicare Other | Source: Ambulatory Visit | Attending: Nurse Practitioner | Admitting: Nurse Practitioner

## 2018-05-04 DIAGNOSIS — Z01818 Encounter for other preprocedural examination: Secondary | ICD-10-CM | POA: Diagnosis present

## 2018-05-04 DIAGNOSIS — I2510 Atherosclerotic heart disease of native coronary artery without angina pectoris: Secondary | ICD-10-CM | POA: Diagnosis present

## 2018-05-04 MED ORDER — TECHNETIUM TC 99M TETROFOSMIN IV KIT
13.4700 | PACK | Freq: Once | INTRAVENOUS | Status: AC | PRN
  Administered 2018-05-04: 13.47 via INTRAVENOUS

## 2018-05-04 MED ORDER — TECHNETIUM TC 99M TETROFOSMIN IV KIT
32.8100 | PACK | Freq: Once | INTRAVENOUS | Status: AC | PRN
  Administered 2018-05-04: 32.81 via INTRAVENOUS

## 2018-05-04 MED ORDER — REGADENOSON 0.4 MG/5ML IV SOLN
0.4000 mg | Freq: Once | INTRAVENOUS | Status: AC
  Administered 2018-05-04: 0.4 mg via INTRAVENOUS

## 2018-05-05 LAB — NM MYOCAR MULTI W/SPECT W/WALL MOTION / EF
CHL CUP NUCLEAR SRS: 7
CHL CUP NUCLEAR SSS: 8
CHL CUP RESTING HR STRESS: 83 {beats}/min
LV dias vol: 113 mL (ref 62–150)
LV sys vol: 60 mL
Peak HR: 92 {beats}/min
Percent HR: 62 %
SDS: 2
TID: 1

## 2018-05-18 ENCOUNTER — Encounter (INDEPENDENT_AMBULATORY_CARE_PROVIDER_SITE_OTHER): Payer: Self-pay

## 2018-05-19 ENCOUNTER — Encounter (INDEPENDENT_AMBULATORY_CARE_PROVIDER_SITE_OTHER): Payer: Self-pay

## 2018-05-27 ENCOUNTER — Other Ambulatory Visit (INDEPENDENT_AMBULATORY_CARE_PROVIDER_SITE_OTHER): Payer: Self-pay | Admitting: Nurse Practitioner

## 2018-06-01 ENCOUNTER — Other Ambulatory Visit: Payer: Self-pay

## 2018-06-01 ENCOUNTER — Encounter
Admission: RE | Admit: 2018-06-01 | Discharge: 2018-06-01 | Disposition: A | Discharge status: Home / Self Care | Payer: Medicare Other | Source: Ambulatory Visit | Attending: Vascular Surgery | Admitting: Vascular Surgery

## 2018-06-01 ENCOUNTER — Inpatient Hospital Stay: Admission: RE | Admit: 2018-06-01 | Payer: Medicare Other | Source: Ambulatory Visit

## 2018-06-01 DIAGNOSIS — I714 Abdominal aortic aneurysm, without rupture: Secondary | ICD-10-CM | POA: Insufficient documentation

## 2018-06-01 DIAGNOSIS — Z01812 Encounter for preprocedural laboratory examination: Secondary | ICD-10-CM | POA: Insufficient documentation

## 2018-06-01 HISTORY — DX: Pneumonia, unspecified organism: J18.9

## 2018-06-01 LAB — PROTIME-INR
INR: 0.93
Prothrombin Time: 12.4 seconds (ref 11.4–15.2)

## 2018-06-01 LAB — APTT: aPTT: 27 seconds (ref 24–36)

## 2018-06-01 NOTE — Patient Instructions (Signed)
Your procedure is scheduled on: Wednesday 06/08/18 Report to [place]. To find out your arrival time please call [phone] between 1PM - 3PM on Tuesday 06/07/18.  Remember: Instructions that are not followed completely may result in serious medical risk, up to and including death, or upon the discretion of your surgeon and anesthesiologist your surgery may need to be rescheduled.     _X__ 1. Do not eat food after midnight the night before your procedure.                 No gum chewing or hard candies. You may drink clear liquids up to 2 hours                 before you are scheduled to arrive for your surgery- DO not drink clear                 liquids within 2 hours of the start of your surgery.                 Clear Liquids include:  water, apple juice without pulp, clear carbohydrate                 drink such as Clearfast or Gatorade, Black Coffee or Tea (Do not add                 anything to coffee or tea).  __X__2.  On the morning of surgery brush your teeth with toothpaste and water, you                 may rinse your mouth with mouthwash if you wish.  Do not swallow any              toothpaste of mouthwash.     _X__ 3.  No Alcohol for 24 hours before or after surgery.   _X__ 4.  Do Not Smoke or use e-cigarettes For 24 Hours Prior to Your Surgery.                 Do not use any chewable tobacco products for at least 6 hours prior to                 surgery.  ____  5.  Bring all medications with you on the day of surgery if instructed.   __X__  6.  Notify your doctor if there is any change in your medical condition      (cold, fever, infections).     Do not wear jewelry, make-up, hairpins, clips or nail polish. Do not wear lotions, powders, or perfumes.  Do not shave 48 hours prior to surgery. Men may shave face and neck. Do not bring valuables to the hospital.    [facility] is not responsible for any belongings or  valuables.  Contacts, dentures/partials or body piercings may not be worn into surgery. Bring a case for your contacts, glasses or hearing aids, a denture cup will be supplied. Leave your suitcase in the car. After surgery it may be brought to your room. For patients admitted to the hospital, discharge time is determined by your treatment team.   Patients discharged the day of surgery will not be allowed to drive home.   Please read over the following fact sheets that you were given:   MRSA Information  __X__ Take these medicines the morning of surgery with A SIP OF WATER:  1. Alprazolam if needed  2. Atorvastatin/Lipitor  3.   4.  5.  6.  ____ Fleet Enema (as directed)   __X__ Use CHG Soap/SAGE wipes as directed  ____ Use inhalers on the day of surgery  ____ Stop metformin/Janumet/Farxiga 2 days prior to surgery    ____ Take 1/2 of usual insulin dose the night before surgery. No insulin the morning          of surgery.   ____ Stop Blood Thinners Coumadin/Plavix/Xarelto/Pleta/Pradaxa/Eliquis/Effient/Aspirin  on   Or contact your Surgeon, Cardiologist or Medical Doctor regarding  ability to stop your blood thinners  __X__ Stop Anti-inflammatories 7 days before surgery such as Advil, Ibuprofen, Motrin,  BC or Goodies Powder, Naprosyn, Naproxen, Aleve   __X__ Stop all herbal supplements, fish oil or vitamin E until after surgery.    ____ Bring C-Pap to the hospital.

## 2018-06-07 MED ORDER — CEFAZOLIN SODIUM-DEXTROSE 2-4 GM/100ML-% IV SOLN
2.0000 g | INTRAVENOUS | Status: AC
  Administered 2018-06-08: 2 g via INTRAVENOUS

## 2018-06-08 ENCOUNTER — Encounter: Payer: Self-pay | Admitting: Certified Registered Nurse Anesthetist

## 2018-06-08 ENCOUNTER — Encounter
Admission: RE | Disposition: A | Discharge status: Home / Self Care | Payer: Self-pay | Source: Home / Self Care | Attending: Vascular Surgery

## 2018-06-08 ENCOUNTER — Inpatient Hospital Stay
Admission: RE | Admit: 2018-06-08 | Discharge: 2018-06-09 | DRG: 269 | Disposition: A | Discharge status: Home / Self Care | Payer: Medicare Other | Attending: Vascular Surgery | Admitting: Vascular Surgery

## 2018-06-08 ENCOUNTER — Inpatient Hospital Stay: Payer: Medicare Other | Admitting: Anesthesiology

## 2018-06-08 DIAGNOSIS — E785 Hyperlipidemia, unspecified: Secondary | ICD-10-CM | POA: Diagnosis present

## 2018-06-08 DIAGNOSIS — F410 Panic disorder [episodic paroxysmal anxiety] without agoraphobia: Secondary | ICD-10-CM | POA: Diagnosis present

## 2018-06-08 DIAGNOSIS — I252 Old myocardial infarction: Secondary | ICD-10-CM

## 2018-06-08 DIAGNOSIS — F17210 Nicotine dependence, cigarettes, uncomplicated: Secondary | ICD-10-CM | POA: Diagnosis present

## 2018-06-08 DIAGNOSIS — K760 Fatty (change of) liver, not elsewhere classified: Secondary | ICD-10-CM | POA: Diagnosis present

## 2018-06-08 DIAGNOSIS — I10 Essential (primary) hypertension: Secondary | ICD-10-CM | POA: Diagnosis present

## 2018-06-08 DIAGNOSIS — Z955 Presence of coronary angioplasty implant and graft: Secondary | ICD-10-CM

## 2018-06-08 DIAGNOSIS — I714 Abdominal aortic aneurysm, without rupture, unspecified: Secondary | ICD-10-CM | POA: Diagnosis present

## 2018-06-08 DIAGNOSIS — I2510 Atherosclerotic heart disease of native coronary artery without angina pectoris: Secondary | ICD-10-CM | POA: Diagnosis present

## 2018-06-08 DIAGNOSIS — I713 Abdominal aortic aneurysm, ruptured, unspecified: Secondary | ICD-10-CM

## 2018-06-08 DIAGNOSIS — Z8249 Family history of ischemic heart disease and other diseases of the circulatory system: Secondary | ICD-10-CM | POA: Diagnosis not present

## 2018-06-08 DIAGNOSIS — Z87442 Personal history of urinary calculi: Secondary | ICD-10-CM

## 2018-06-08 DIAGNOSIS — J449 Chronic obstructive pulmonary disease, unspecified: Secondary | ICD-10-CM | POA: Diagnosis present

## 2018-06-08 HISTORY — PX: ENDOVASCULAR REPAIR/STENT GRAFT: CATH118280

## 2018-06-08 LAB — MRSA PCR SCREENING: MRSA BY PCR: NEGATIVE

## 2018-06-08 LAB — GLUCOSE, CAPILLARY: GLUCOSE-CAPILLARY: 132 mg/dL — AB (ref 70–99)

## 2018-06-08 LAB — ABO/RH: ABO/RH(D): A NEG

## 2018-06-08 SURGERY — ENDOVASCULAR STENT GRAFT (AAA)
Anesthesia: General

## 2018-06-08 MED ORDER — HEPARIN SODIUM (PORCINE) 1000 UNIT/ML IJ SOLN
INTRAMUSCULAR | Status: DC | PRN
  Administered 2018-06-08: 6000 [IU] via INTRAVENOUS

## 2018-06-08 MED ORDER — ROCURONIUM BROMIDE 50 MG/5ML IV SOLN
INTRAVENOUS | Status: AC
  Filled 2018-06-08: qty 1

## 2018-06-08 MED ORDER — IOPAMIDOL (ISOVUE-300) INJECTION 61%
INTRAVENOUS | Status: DC | PRN
  Administered 2018-06-08: 60 mL via INTRAVENOUS

## 2018-06-08 MED ORDER — FAMOTIDINE IN NACL 20-0.9 MG/50ML-% IV SOLN
20.0000 mg | Freq: Two times a day (BID) | INTRAVENOUS | Status: DC
  Administered 2018-06-08: 20 mg via INTRAVENOUS
  Filled 2018-06-08 (×2): qty 50

## 2018-06-08 MED ORDER — ONDANSETRON HCL 4 MG/2ML IJ SOLN: 4.0000 mg | Freq: Once | INTRAMUSCULAR | Status: DC | PRN

## 2018-06-08 MED ORDER — LABETALOL HCL 5 MG/ML IV SOLN
10.0000 mg | INTRAVENOUS | Status: DC | PRN
  Administered 2018-06-08 (×2): 10 mg via INTRAVENOUS
  Filled 2018-06-08: qty 4

## 2018-06-08 MED ORDER — GLYCOPYRROLATE 0.2 MG/ML IJ SOLN
INTRAMUSCULAR | Status: DC | PRN
  Administered 2018-06-08: 0.2 mg via INTRAVENOUS

## 2018-06-08 MED ORDER — ASPIRIN EC 81 MG PO TBEC
81.0000 mg | DELAYED_RELEASE_TABLET | Freq: Every day | ORAL | Status: DC
  Administered 2018-06-08 – 2018-06-09 (×2): 81 mg via ORAL
  Filled 2018-06-08 (×2): qty 1

## 2018-06-08 MED ORDER — LIDOCAINE HCL (PF) 2 % IJ SOLN
INTRAMUSCULAR | Status: AC
  Filled 2018-06-08: qty 10

## 2018-06-08 MED ORDER — ONDANSETRON HCL 4 MG/2ML IJ SOLN: 4.0000 mg | Freq: Four times a day (QID) | INTRAMUSCULAR | Status: DC | PRN

## 2018-06-08 MED ORDER — SODIUM CHLORIDE 0.9 % IV SOLN
INTRAVENOUS | Status: DC
  Administered 2018-06-08: 11:00:00 via INTRAVENOUS

## 2018-06-08 MED ORDER — DEXAMETHASONE SODIUM PHOSPHATE 10 MG/ML IJ SOLN
INTRAMUSCULAR | Status: AC
  Filled 2018-06-08: qty 1

## 2018-06-08 MED ORDER — FAMOTIDINE 20 MG PO TABS
ORAL_TABLET | ORAL | Status: AC
  Filled 2018-06-08: qty 1

## 2018-06-08 MED ORDER — MORPHINE SULFATE (PF) 2 MG/ML IV SOLN: 2.0000 mg | INTRAVENOUS | Status: DC | PRN

## 2018-06-08 MED ORDER — ALUM & MAG HYDROXIDE-SIMETH 200-200-20 MG/5ML PO SUSP
15.0000 mL | ORAL | Status: DC | PRN
  Filled 2018-06-08: qty 30

## 2018-06-08 MED ORDER — HYDRALAZINE HCL 20 MG/ML IJ SOLN
10.0000 mg | Freq: Once | INTRAMUSCULAR | Status: AC
  Administered 2018-06-08: 10 mg via INTRAVENOUS

## 2018-06-08 MED ORDER — HYDRALAZINE HCL 20 MG/ML IJ SOLN
INTRAMUSCULAR | Status: AC
  Filled 2018-06-08: qty 1

## 2018-06-08 MED ORDER — TORSEMIDE 20 MG PO TABS
20.0000 mg | ORAL_TABLET | Freq: Every day | ORAL | Status: DC
  Administered 2018-06-08: 20 mg via ORAL
  Filled 2018-06-08 (×2): qty 1

## 2018-06-08 MED ORDER — SUGAMMADEX SODIUM 200 MG/2ML IV SOLN
INTRAVENOUS | Status: DC | PRN
  Administered 2018-06-08: 200 mg via INTRAVENOUS

## 2018-06-08 MED ORDER — SUGAMMADEX SODIUM 200 MG/2ML IV SOLN
INTRAVENOUS | Status: AC
  Filled 2018-06-08: qty 2

## 2018-06-08 MED ORDER — PROPOFOL 10 MG/ML IV BOLUS
INTRAVENOUS | Status: AC
  Filled 2018-06-08: qty 20

## 2018-06-08 MED ORDER — METOPROLOL TARTRATE 5 MG/5ML IV SOLN: 2.0000 mg | INTRAVENOUS | Status: DC | PRN

## 2018-06-08 MED ORDER — HYDRALAZINE HCL 20 MG/ML IJ SOLN: 5.0000 mg | INTRAMUSCULAR | Status: DC | PRN

## 2018-06-08 MED ORDER — FENTANYL CITRATE (PF) 100 MCG/2ML IJ SOLN
INTRAMUSCULAR | Status: AC
  Filled 2018-06-08: qty 2

## 2018-06-08 MED ORDER — ACETAMINOPHEN 325 MG PO TABS: 325.0000 mg | ORAL_TABLET | ORAL | Status: DC | PRN

## 2018-06-08 MED ORDER — ATORVASTATIN CALCIUM 20 MG PO TABS
80.0000 mg | ORAL_TABLET | Freq: Every day | ORAL | Status: DC
  Filled 2018-06-08: qty 4

## 2018-06-08 MED ORDER — ONDANSETRON HCL 4 MG/2ML IJ SOLN
INTRAMUSCULAR | Status: DC | PRN
  Administered 2018-06-08: 4 mg via INTRAVENOUS

## 2018-06-08 MED ORDER — CEFAZOLIN SODIUM-DEXTROSE 2-4 GM/100ML-% IV SOLN
2.0000 g | Freq: Three times a day (TID) | INTRAVENOUS | Status: AC
  Administered 2018-06-08 – 2018-06-09 (×2): 2 g via INTRAVENOUS
  Filled 2018-06-08 (×2): qty 100

## 2018-06-08 MED ORDER — PHENOL 1.4 % MT LIQD
1.0000 | OROMUCOSAL | Status: DC | PRN
  Filled 2018-06-08: qty 177

## 2018-06-08 MED ORDER — FAMOTIDINE 20 MG PO TABS
20.0000 mg | ORAL_TABLET | Freq: Once | ORAL | Status: AC
  Administered 2018-06-08: 20 mg via ORAL

## 2018-06-08 MED ORDER — ALPRAZOLAM 0.5 MG PO TABS
0.5000 mg | ORAL_TABLET | Freq: Three times a day (TID) | ORAL | Status: DC | PRN
  Administered 2018-06-08 (×2): 0.5 mg via ORAL
  Filled 2018-06-08 (×2): qty 1

## 2018-06-08 MED ORDER — HEPARIN (PORCINE) IN NACL 1000-0.9 UT/500ML-% IV SOLN
INTRAVENOUS | Status: AC
  Filled 2018-06-08: qty 1500

## 2018-06-08 MED ORDER — HEPARIN SODIUM (PORCINE) 1000 UNIT/ML IJ SOLN
INTRAMUSCULAR | Status: AC
  Filled 2018-06-08: qty 1

## 2018-06-08 MED ORDER — ENALAPRIL MALEATE 20 MG PO TABS
20.0000 mg | ORAL_TABLET | Freq: Two times a day (BID) | ORAL | Status: DC
  Administered 2018-06-08 – 2018-06-09 (×2): 20 mg via ORAL
  Filled 2018-06-08 (×3): qty 1

## 2018-06-08 MED ORDER — HYDROMORPHONE HCL 1 MG/ML IJ SOLN: 1.0000 mg | Freq: Once | INTRAMUSCULAR | Status: DC | PRN

## 2018-06-08 MED ORDER — LABETALOL HCL 5 MG/ML IV SOLN
INTRAVENOUS | Status: AC
  Administered 2018-06-08: 10 mg via INTRAVENOUS
  Filled 2018-06-08: qty 4

## 2018-06-08 MED ORDER — PROPOFOL 10 MG/ML IV BOLUS
INTRAVENOUS | Status: DC | PRN
  Administered 2018-06-08: 180 mg via INTRAVENOUS

## 2018-06-08 MED ORDER — ACETAMINOPHEN 650 MG RE SUPP: 325.0000 mg | RECTAL | Status: DC | PRN

## 2018-06-08 MED ORDER — ROCURONIUM BROMIDE 100 MG/10ML IV SOLN
INTRAVENOUS | Status: DC | PRN
  Administered 2018-06-08 (×2): 50 mg via INTRAVENOUS

## 2018-06-08 MED ORDER — POTASSIUM CHLORIDE CRYS ER 20 MEQ PO TBCR
20.0000 meq | EXTENDED_RELEASE_TABLET | Freq: Every day | ORAL | Status: DC
  Administered 2018-06-08: 20 meq via ORAL
  Filled 2018-06-08 (×2): qty 1

## 2018-06-08 MED ORDER — LACTATED RINGERS IV SOLN
INTRAVENOUS | Status: DC
  Administered 2018-06-08: 07:00:00 via INTRAVENOUS

## 2018-06-08 MED ORDER — NITROGLYCERIN 0.4 MG SL SUBL: 0.4000 mg | SUBLINGUAL_TABLET | SUBLINGUAL | Status: DC | PRN

## 2018-06-08 MED ORDER — SODIUM CHLORIDE 0.9 % IV SOLN: 500.0000 mL | Freq: Once | INTRAVENOUS | Status: DC | PRN

## 2018-06-08 MED ORDER — ONDANSETRON HCL 4 MG/2ML IJ SOLN
INTRAMUSCULAR | Status: AC
  Filled 2018-06-08: qty 2

## 2018-06-08 MED ORDER — FENTANYL CITRATE (PF) 100 MCG/2ML IJ SOLN
INTRAMUSCULAR | Status: AC
  Administered 2018-06-08: 25 ug via INTRAVENOUS
  Filled 2018-06-08: qty 2

## 2018-06-08 MED ORDER — LIDOCAINE HCL (CARDIAC) PF 100 MG/5ML IV SOSY
PREFILLED_SYRINGE | INTRAVENOUS | Status: DC | PRN
  Administered 2018-06-08: 90 mg via INTRAVENOUS

## 2018-06-08 MED ORDER — FENTANYL CITRATE (PF) 100 MCG/2ML IJ SOLN
25.0000 ug | INTRAMUSCULAR | Status: DC | PRN
  Administered 2018-06-08 (×2): 25 ug via INTRAVENOUS

## 2018-06-08 MED ORDER — DOPAMINE-DEXTROSE 3.2-5 MG/ML-% IV SOLN: 3.0000 ug/kg/min | INTRAVENOUS | Status: DC

## 2018-06-08 MED ORDER — OXYCODONE-ACETAMINOPHEN 5-325 MG PO TABS: 1.0000 | ORAL_TABLET | ORAL | Status: DC | PRN

## 2018-06-08 MED ORDER — NITROGLYCERIN IN D5W 200-5 MCG/ML-% IV SOLN: 5.0000 ug/min | INTRAVENOUS | Status: DC

## 2018-06-08 MED ORDER — POTASSIUM CHLORIDE CRYS ER 20 MEQ PO TBCR: 20.0000 meq | EXTENDED_RELEASE_TABLET | Freq: Every day | ORAL | Status: DC | PRN

## 2018-06-08 MED ORDER — FENTANYL CITRATE (PF) 100 MCG/2ML IJ SOLN
INTRAMUSCULAR | Status: DC | PRN
  Administered 2018-06-08 (×2): 50 ug via INTRAVENOUS

## 2018-06-08 MED ORDER — GUAIFENESIN-DM 100-10 MG/5ML PO SYRP
15.0000 mL | ORAL_SOLUTION | ORAL | Status: DC | PRN
  Filled 2018-06-08: qty 15

## 2018-06-08 MED ORDER — DEXAMETHASONE SODIUM PHOSPHATE 10 MG/ML IJ SOLN
INTRAMUSCULAR | Status: DC | PRN
  Administered 2018-06-08: 10 mg via INTRAVENOUS

## 2018-06-08 MED ORDER — MAGNESIUM SULFATE 2 GM/50ML IV SOLN: 2.0000 g | Freq: Every day | INTRAVENOUS | Status: DC | PRN

## 2018-06-08 MED ORDER — DOCUSATE SODIUM 100 MG PO CAPS
100.0000 mg | ORAL_CAPSULE | Freq: Every day | ORAL | Status: DC
  Filled 2018-06-08: qty 1

## 2018-06-08 SURGICAL SUPPLY — 52 items
ADH SKN CLS APL DERMABOND .7 (GAUZE/BANDAGES/DRESSINGS) ×1
BLADE SURG 15 STRL LF DISP TIS (BLADE) IMPLANT
BLADE SURG 15 STRL SS (BLADE) ×2
BLADE SURG SZ11 CARB STEEL (BLADE) ×1 IMPLANT
CATH ACCU-VU SIZ PIG 5F 70CM (CATHETERS) ×1 IMPLANT
CATH BALLN CODA 9X100X32 (BALLOONS) ×1 IMPLANT
CATH BEACON 5 .035 65 C2 TIP (CATHETERS) ×1 IMPLANT
CATH BEACON 5 .035 65 KMP TIP (CATHETERS) ×1 IMPLANT
COVER PROBE U/S 5X48 (MISCELLANEOUS) ×1 IMPLANT
DERMABOND ADVANCED (GAUZE/BANDAGES/DRESSINGS) ×1
DERMABOND ADVANCED .7 DNX12 (GAUZE/BANDAGES/DRESSINGS) IMPLANT
DEVICE CLOSURE PERCLS PRGLD 6F (VASCULAR PRODUCTS) IMPLANT
DEVICE SAFEGUARD 24CM (GAUZE/BANDAGES/DRESSINGS) ×2 IMPLANT
DEVICE TORQUE .025-.038 (MISCELLANEOUS) ×1 IMPLANT
DRYSEAL FLEXSHEATH 12FR 33CM (SHEATH) ×1
DRYSEAL FLEXSHEATH 18FR 33CM (SHEATH) ×1
ELECT CAUTERY BLADE 6.4 (BLADE) ×2 IMPLANT
ELECT REM PT RETURN 9FT ADLT (ELECTROSURGICAL) ×2
ELECTRODE REM PT RTRN 9FT ADLT (ELECTROSURGICAL) IMPLANT
EXCLUDER TNK 28.5X14.5X18CM (Endovascular Graft) IMPLANT
EXCLUDER TRUNK 28.5X14.5X18CM (Endovascular Graft) ×2 IMPLANT
GAUZE SPONGE 4X4 12PLY STRL (GAUZE/BANDAGES/DRESSINGS) ×3 IMPLANT
GLIDEWIRE STIFF .35X180X3 HYDR (WIRE) ×1 IMPLANT
GLOVE BIO SURGEON STRL SZ7 (GLOVE) ×1 IMPLANT
GLOVE SURG SYN 8.0 (GLOVE) ×2 IMPLANT
GLOVE SURG SYN 8.0 PF PI (GLOVE) IMPLANT
GOWN STRL REUS W/ TWL XL LVL3 (GOWN DISPOSABLE) IMPLANT
GOWN STRL REUS W/TWL XL LVL3 (GOWN DISPOSABLE) ×4
LEG CONTRALATERAL 16X16X13.5 (Endovascular Graft) ×2 IMPLANT
LOOP RED MAXI  1X406MM (MISCELLANEOUS) ×1
LOOP VESSEL MAXI 1X406 RED (MISCELLANEOUS) IMPLANT
LOOP VESSEL MINI 0.8X406 BLUE (MISCELLANEOUS) IMPLANT
LOOPS BLUE MINI 0.8X406MM (MISCELLANEOUS) ×1
NDL ENTRY 21GA 7CM ECHOTIP (NEEDLE) IMPLANT
NEEDLE ENTRY 21GA 7CM ECHOTIP (NEEDLE) ×2 IMPLANT
PACK ANGIOGRAPHY (CUSTOM PROCEDURE TRAY) ×1 IMPLANT
PACK BASIN MAJOR ARMC (MISCELLANEOUS) ×1 IMPLANT
PERCLOSE PROGLIDE 6F (VASCULAR PRODUCTS) ×10
SET INTRO CAPELLA COAXIAL (SET/KITS/TRAYS/PACK) ×1 IMPLANT
SHEATH BRITE TIP 6FRX11 (SHEATH) ×2 IMPLANT
SHEATH BRITE TIP 8FRX11 (SHEATH) ×2 IMPLANT
SHEATH DRYSEAL FLEX 12FR 33CM (SHEATH) IMPLANT
SHEATH DRYSEAL FLEX 18FR 33CM (SHEATH) IMPLANT
SHIELD RADPAD DADD DRAPE 4X9 (MISCELLANEOUS) ×2 IMPLANT
STENT GRAFT CONTRALAT 16X13.5 (Endovascular Graft) IMPLANT
SUT MNCRL 4-0 (SUTURE) ×2
SUT MNCRL 4-0 27XMFL (SUTURE) ×1
SUT PROLENE 6 0 BV (SUTURE) ×1 IMPLANT
SUTURE MNCRL 4-0 27XMF (SUTURE) IMPLANT
SYR 20CC LL (SYRINGE) ×1 IMPLANT
WIRE AMPLATZ SSTIFF .035X260CM (WIRE) ×2 IMPLANT
WIRE J 3MM .035X145CM (WIRE) ×2 IMPLANT

## 2018-06-08 NOTE — Anesthesia Procedure Notes (Signed)
Procedure Name: Intubation Date/Time: 06/08/2018 7:49 AM Performed by: Dava Najjar, CRNA Pre-anesthesia Checklist: Patient identified, Emergency Drugs available, Suction available, Patient being monitored and Timeout performed Patient Re-evaluated:Patient Re-evaluated prior to induction Oxygen Delivery Method: Circle system utilized Preoxygenation: Pre-oxygenation with 100% oxygen Induction Type: IV induction Ventilation: Mask ventilation without difficulty and Oral airway inserted - appropriate to patient size Laryngoscope Size: McGraph and 4 Grade View: Grade I Tube type: Oral Tube size: 8.0 mm Airway Equipment and Method: Stylet and Video-laryngoscopy Placement Confirmation: positive ETCO2 and breath sounds checked- equal and bilateral Secured at: 23 cm Tube secured with: Tape Dental Injury: Teeth and Oropharynx as per pre-operative assessment

## 2018-06-08 NOTE — Progress Notes (Signed)
Hydralazine given for blood pressure 171/91   Heart rate 49 to 50's

## 2018-06-08 NOTE — Progress Notes (Signed)
Dr Maisie Fus by to check pt   Pt heart rate down to mid 50's and back up with missed beats    Ordered lopressor for blood pressure and not concerned with heart rate at present

## 2018-06-08 NOTE — H&P (Signed)
[facility] Admission History & Physical  MRN : [id number]  Bobby Sandoval is a 72 y.o. (04/05/1946) male who presents with chief complaint of No chief complaint on file. Marland Kitchen  History of Present Illness: Patient presents today for elective repair of his abdominal aortic aneurysm.  This is nearing 6 cm in maximal diameter as demonstrated by CT scan of June 2019.  Has been several weeks getting this procedure performed awaiting cardiac clearance which deemed him at least moderate risk but with a large aneurysm which is had a significant growth of almost a centimeter in the last year, this needs to be repaired.  Patient has no complaints today.  No fevers or chills.  No chest pain or shortness of breath.  Current Facility-Administered Medications  Medication Dose Route Frequency Provider Last Rate Last Dose  . ceFAZolin (ANCEF) IVPB 2g/100 mL premix  2 g Intravenous On Call to OR Georgiana Spinner, NP      . famotidine (PEPCID) 20 MG tablet           . HYDROmorphone (DILAUDID) injection 1 mg  1 mg Intravenous Once PRN Georgiana Spinner, NP      . lactated ringers infusion   Intravenous Continuous Naomie Dean, MD 75 mL/hr at 06/08/18 0724    . ondansetron (ZOFRAN) injection 4 mg  4 mg Intravenous Q6H PRN Georgiana Spinner, NP        Past Medical History:  Diagnosis Date  . AAA (abdominal aortic aneurysm) (HCC)    a. 2012 3.4 cm; b. 07/2017 Abd u/s: Dist abd Ao dil up to 4.8cm; c. 03/2018 CT Abd: infrarenal AAA 5.3 x 5.7cm.  Marland Kitchen CAD (coronary artery disease)    a. 2007 Inf MI w/ BMS to RCA x 2; b. 2008 PCI/DES to mid LAD @ [facility]; c. 07/2015 Cath: LAD 100 prox to prev placed stent w/ R->L collats, RCA stents patent. EF 50-55%. Med Rx.  Marland Kitchen COPD (chronic obstructive pulmonary disease) (HCC)   . Diastolic dysfunction    a. 02/2014 Echo: EF 55-65%. Gr1 DD, no rwma, mildly dil LA, nl RV fxn; b. 01/2018 Echo: EF 60-65%, Gr1 DD, mild MR, mildly dil LA. nl RV fxn. Nl PASP.  Marland Kitchen ETOH  abuse   . Hepatic steatosis    a. 03/2018 CT Abd: hepatic steatosis and possible early cirrhotic morphology of liver.  Marland Kitchen History of kidney stones   . Hyperlipidemia   . Hypertension   . MI (myocardial infarction) (HCC)   . Obesity   . Panic disorder   . Pneumonia   . Tobacco abuse     Past Surgical History:  Procedure Laterality Date  . BACK SURGERY    . CARDIAC CATHETERIZATION  08/2006   x1 stent [facility]. Mid RCA: 3.5 x 16 mm overlap with 3.5 x 24 mm Liberte bare-metal stents  . CARDIAC CATHETERIZATION  10/2006   x1 stent VA: Mid LAD: 2.5 x 28 mm Cypher drug-eluting  . CARDIAC CATHETERIZATION N/A 07/25/2015   Procedure: Left Heart Cath and Coronary Angiography;  Surgeon: Iran Ouch, MD;  Location: [facility] INVASIVE CV LAB;  Service: Cardiovascular;  Laterality: N/A;  . COLONOSCOPY    . coronary stents    . MICRODISCECTOMY LUMBAR      Social History Social History   Tobacco Use  . Smoking status: Current Every Day Smoker    Packs/day: 2.00    Years: 50.00    Pack years: 100.00    Types:  Cigarettes  . Smokeless tobacco: Never Used  Substance Use Topics  . Alcohol use: Yes    Alcohol/week: 84.0 standard drinks    Types: 84 Cans of beer per week    Comment: daily  . Drug use: No    Family History Family History  Problem Relation Age of Onset  . Hypertension Mother   . Heart disease Father   No aneurysms No bleeding or clotting disorders  No Known Allergies   REVIEW OF SYSTEMS (Negative unless checked)  Constitutional: [] Weight loss  [] Fever  [] Chills Cardiac: [] Chest pain   [] Chest pressure   [x] Palpitations   [] Shortness of breath when laying flat   [] Shortness of breath at rest   [x] Shortness of breath with exertion. Vascular:  [] Pain in legs with walking   [] Pain in legs at rest   [] Pain in legs when laying flat   [] Claudication   [] Pain in feet when walking  [] Pain in feet at rest  [] Pain in feet when laying flat   [] History of DVT   [] Phlebitis   [] Swelling in  legs   [] Varicose veins   [] Non-healing ulcers Pulmonary:   [] Uses home oxygen   [] Productive cough   [] Hemoptysis   [] Wheeze  [x] COPD   [] Asthma Neurologic:  [] Dizziness  [] Blackouts   [] Seizures   [] History of stroke   [] History of TIA  [] Aphasia   [] Temporary blindness   [] Dysphagia   [] Weakness or numbness in arms   [] Weakness or numbness in legs Musculoskeletal:  [x] Arthritis   [] Joint swelling   [] Joint pain   [] Low back pain Hematologic:  [] Easy bruising  [] Easy bleeding   [] Hypercoagulable state   [] Anemic  [] Hepatitis Gastrointestinal:  [] Blood in stool   [] Vomiting blood  [] Gastroesophageal reflux/heartburn   [] Difficulty swallowing. Genitourinary:  [] Chronic kidney disease   [] Difficult urination  [] Frequent urination  [] Burning with urination   [] Blood in urine Skin:  [] Rashes   [] Ulcers   [] Wounds Psychological:  [x] History of anxiety   []  History of major depression.  Physical Examination  Vitals:   06/08/18 0642  BP: (!) 161/73  Pulse: (!) 50  Resp: 16  Temp: 98.1 F (36.7 C)  SpO2: 97%  Weight: 97.1 kg  Height: 6' (1.829 m)   Body mass index is 29.02 kg/m. Gen: WD/WN, NAD Head: [place]/AT, No temporalis wasting. Ear/Nose/Throat: Hearing grossly intact, nares w/o erythema or drainage, oropharynx w/o Erythema/Exudate,  Eyes: Conjunctiva clear, sclera non-icteric Neck: Trachea midline.  No JVD.  Pulmonary:  Good air movement, respirations not labored, no use of accessory muscles.  Cardiac: RRR, normal S1, S2. Vascular:  Vessel Right Left  Radial Palpable Palpable                                   Gastrointestinal: soft, non-tender/non-distended. Enlarged aortic impulse Musculoskeletal: M/S 5/5 throughout.  Extremities without ischemic changes.  No deformity or atrophy.  Neurologic: Sensation grossly intact in extremities.  Symmetrical.  Speech is fluent. Motor exam as listed above. Psychiatric: Judgment intact, Mood & affect appropriate for pt's clinical  situation. Dermatologic: No rashes or ulcers noted.  No cellulitis or open wounds.      CBC Lab Results  Component Value Date   WBC 4.7 02/03/2018   HGB 16.7 02/03/2018   HCT 47.9 02/03/2018   MCV 92 02/03/2018   PLT 180 02/03/2018    BMET    Component Value Date/Time   NA 138  02/03/2018 0902   K 4.7 02/03/2018 0902   CL 102 02/03/2018 0902   CO2 23 02/03/2018 0902   GLUCOSE 100 (H) 02/03/2018 0902   GLUCOSE 111 (H) 01/23/2016 1124   BUN 7 (L) 02/03/2018 0902   CREATININE 0.87 02/03/2018 0902   CALCIUM 9.5 02/03/2018 0902   GFRNONAA 86 02/03/2018 0902   GFRAA 99 02/03/2018 0902   CrCl cannot be calculated (Patient's most recent lab result is older than the maximum 21 days allowed.).  COAG Lab Results  Component Value Date   INR 0.93 06/01/2018   INR 1.05 07/19/2015    Radiology No results found.   Assessment/Plan 1. AAA. Large.  Needs repair to avoid rupture.  Risks and benefits discussed and he is agreeable to proceed.  2.  Heart disease.  Has been seen by cardiology and is at least moderate risk for repair but with a large aneurysm and being able to do this in a minimally invasive fashion, the benefits of repair outweigh the risks 3.  Hypertension. Stable on outpatient medications and blood pressure control important in reducing the progression of atherosclerotic disease. On appropriate oral medications. 4.  COPD. Continue pulmonary medications and aerosols as already ordered, these medications have been reviewed and there are no changes at this time.      Festus Barren, MD  06/08/2018 7:30 AM

## 2018-06-08 NOTE — Progress Notes (Signed)
Patient stated he was going to take a xanax from his home supply because he did not want to pay for a hospital xanax. Nurse knowledged  his concerns, offered options and stated safety concerns as well as policy and procedures for taking home medications with out going through the proper safety channels. When offered to use his own medications after being evaluated by the pharmacist and stored in the pharmacy the patient refused. Director Enzo Bi spoke with the patient to find a solutions to the patients concerns. Patient agreed to use the hospital stock and that he would not use his personal supply. Patient was advised to take medication home. Xanax was never seen or shown to or by the nurse. Patients wife present for entire exchange.

## 2018-06-08 NOTE — Anesthesia Preprocedure Evaluation (Addendum)
Anesthesia Evaluation  Patient identified by MRN, date of birth, ID band Patient awake    Reviewed: Allergy & Precautions, NPO status , Patient's Chart, lab work & pertinent test results, reviewed documented beta blocker date and time   Airway Mallampati: III  TM Distance: >3 FB     Dental  (+) Chipped, Poor Dentition, Dental Advisory Given, Missing   Pulmonary pneumonia, resolved, COPD, Current Smoker,           Cardiovascular hypertension, Pt. on medications + CAD, + Past MI, + Cardiac Stents and + Peripheral Vascular Disease       Neuro/Psych PSYCHIATRIC DISORDERS Anxiety    GI/Hepatic   Endo/Other    Renal/GU      Musculoskeletal   Abdominal   Peds  Hematology   Anesthesia Other Findings Hx of ETOH. Echo 60.  Reproductive/Obstetrics                            Anesthesia Physical Anesthesia Plan  ASA: III  Anesthesia Plan: General   Post-op Pain Management:    Induction: Intravenous  PONV Risk Score and Plan:   Airway Management Planned: Oral ETT  Additional Equipment:   Intra-op Plan:   Post-operative Plan:   Informed Consent: I have reviewed the patients History and Physical, chart, labs and discussed the procedure including the risks, benefits and alternatives for the proposed anesthesia with the patient or authorized representative who has indicated his/her understanding and acceptance.     Plan Discussed with: CRNA  Anesthesia Plan Comments:         Anesthesia Quick Evaluation

## 2018-06-08 NOTE — Anesthesia Post-op Follow-up Note (Signed)
Anesthesia QCDR form completed.        

## 2018-06-08 NOTE — Op Note (Signed)
    OPERATIVE NOTE   PROCEDURE: 1. US guidance for vascular access, bilateral femoral arteries 2. Catheter placement into aorta from bilateral femoral approaches 3. Placement of a 28 x 14 x 18 C3 Gore Excluder Endoprosthesis main body with a 16 x 14 contralateral limb 4. ProGlide closure devices bilateral femoral arteries  PRE-OPERATIVE DIAGNOSIS: AAA  POST-OPERATIVE DIAGNOSIS: same  SURGEON: Levora Dredge, MD and Festus Barren, MD - Co-surgeons  ANESTHESIA: general  ESTIMATED BLOOD LOSS: 50 cc  FINDING(S): 1.  AAA  SPECIMEN(S):  none  INDICATIONS:   Bobby Sandoval is a 72 y.o. y.o. male who presents with large abdominal aortic aneurysm.  This has exhibited rapid growth over the past year and is therefore at significant risk for lethal rupture.  He is a suitable endovascular candidate.  The risks and benefits were reviewed all questions answered patient agrees to proceed.  DESCRIPTION: After obtaining full informed written consent, the patient was brought back to the operating room and placed supine upon the operating table.  The patient received IV antibiotics prior to induction.  After obtaining adequate anesthesia, the patient was prepped and draped in the standard fashion for endovascular AAA repair.  We are co-surgeons on this case secondary to the bilateral nature and the complexity making it safer for the patient to operate together.  We then began by gaining access to both femoral arteries with US guidance with me working on the patient's left and Dr. Wyn Quaker working on the patient's right.  The femoral arteries were found to be patent and accessed without difficulty with a needle under ultrasound guidance without difficulty on each side and permanent images were recorded.  We then placed 2 proglide devices on each side in a pre-close fashion and placed 8 French sheaths.  The patient was then given 5000 units of intravenous heparin.   The Pigtail catheter was placed into the  aorta from the right side. Using this image, we selected a 28 x 14 x 18 Main body device.  Over a stiff wire, an 67 French sheath was placed. The main body was then placed through the 18 French sheath. A Kumpe catheter was placed up the left side and a magnified image at the renal arteries was performed. The main body was then deployed just below the lowest renal artery. The Kumpe catheter was used to cannulate the contralateral gate without difficulty and successful cannulation was confirmed by twirling the pigtail catheter in the main body. We then placed a stiff wire and a retrograde arteriogram was performed through the left femoral sheath. We upsized to the 12 [place] sheath for the contralateral limb and a 16 x 14 limb was selected and deployed. The main body deployment was then completed. Based off the angiographic findings, extension limbs were not necessary.  All junction points and seals zones were treated with the compliant balloon.   The pigtail catheter was then replaced and a completion angiogram was performed.   No endoleak was detected on completion angiography. The renal arteries were found to be widely patent.    At this point we elected to terminate the procedure. We secured the pro glide devices for hemostasis on the femoral arteries. The skin incision was closed with a 4-0 Monocryl. Dermabond and pressure dressing were placed. The patient was taken to the recovery room in stable condition having tolerated the procedure well.  COMPLICATIONS: none  CONDITION: stable  Levora Dredge  06/08/2018, 9:30 AM

## 2018-06-08 NOTE — Transfer of Care (Signed)
Immediate Anesthesia Transfer of Care Note  Patient: Bobby Sandoval  Procedure(s) Performed: ENDOVASCULAR REPAIR/STENT GRAFT (N/A )  Patient Location: PACU  Anesthesia Type:General  Level of Consciousness: awake, alert , oriented and pateint uncooperative  Airway & Oxygen Therapy: Patient Spontanous Breathing and Patient connected to face mask oxygen  Post-op Assessment: Report given to RN and Post -op Vital signs reviewed and stable  Post vital signs: Reviewed and stable  Last Vitals:  Vitals Value Taken Time  BP 180/98 06/08/2018  9:28 AM  Temp    Pulse 81 06/08/2018  9:30 AM  Resp 15 06/08/2018  9:30 AM  SpO2 100 % 06/08/2018  9:30 AM  Vitals shown include unvalidated device data.  Last Pain:  Vitals:   06/08/18 0642  PainSc: 0-No pain         Complications: No apparent anesthesia complications

## 2018-06-08 NOTE — Op Note (Signed)
OPERATIVE NOTE   PROCEDURE: 1. US guidance for vascular access, bilateral femoral arteries 2. Catheter placement into aorta from bilateral femoral approaches 3. Placement of a 28 mm proximal, 14 mm distal, 18 cm length Gore Excluder Endoprosthesis main body right with a 16 mm diameter by 14 cm length left contralateral limb 4. ProGlide closure devices bilateral femoral arteries  PRE-OPERATIVE DIAGNOSIS: AAA  POST-OPERATIVE DIAGNOSIS: same  SURGEON: Festus Barren, MD and Levora Dredge, MD - Co-surgeons  ANESTHESIA: General  ESTIMATED BLOOD LOSS: 25 cc  FINDING(S): 1.  AAA  SPECIMEN(S):  none  INDICATIONS:   Bobby Sandoval is a 72 y.o. male who presents with a 5.7 cm abdominal aortic aneurysm with nearly a centimeter of growth in less than a year. The anatomy was suitable for endovascular repair.  Risks and benefits of repair in an endovascular fashion were discussed and informed consent was obtained.  DESCRIPTION: After obtaining full informed written consent, the patient was brought back to the operating room and placed supine upon the operating table.  The patient received IV antibiotics prior to induction.  After obtaining adequate anesthesia, the patient was prepped and draped in the standard fashion for endovascular AAA repair.  We then began by gaining access to both femoral arteries with US guidance with me working on the right and Dr. Gilda Crease working on the left.  Co-surgeons are used to expedite the procedure so that the bilateral work can be done simultaneously which markedly shortens the procedure.  The femoral arteries were found to be patent and accessed without difficulty with a needle under ultrasound guidance without difficulty on each side and permanent images were recorded.  We then placed 2 proglide devices on each side in a pre-close fashion and placed 8 French sheaths. The patient was then given 6000 units of intravenous heparin. The Pigtail catheter was placed  into the aorta from the right side. Using this image, we selected a 28 mm diameter, 18 centimeters length with Main body device.  Over a stiff wire, an 96 French sheath was placed up the right side. The main body was then placed through the 18 French sheath on the right side. A Kumpe catheter was placed up the left side and a magnified image at the renal arteries was performed. The main body was then deployed just below the lowest renal artery which was the left although the right was only a few millimeters higher. The C2 catheter was used to cannulate the contralateral gate with a mild amount of difficulty and successful cannulation was confirmed by twirling the pigtail catheter in the main body.  This was done after re-constraining the main body and slightly changing the angle of the contralateral gate.  We then placed a stiff wire and a retrograde arteriogram was performed through the left femoral sheath. We upsized to the 12 [place] sheath on the left for the contralateral limb and a 16 mm diameter by 14 cm length left iliac limb was selected and deployed. The main body deployment was then completed. Based off the angiographic findings, extension limbs were not necessary. All junction points and seals zones were treated with the compliant balloon. The pigtail catheter was then replaced and a completion angiogram was performed.  No endoleak was detected on completion angiography. The renal arteries were found to be widely patent. At this point we elected to terminate the procedure. We secured the pro glide devices for hemostasis on the femoral arteries. The skin incision was closed with a  4-0 Monocryl. Dermabond and pressure dressing were placed. The patient was taken to the recovery room in stable condition having tolerated the procedure well.  COMPLICATIONS: none  CONDITION: stable  Festus Barren  06/08/2018, 9:19 AM   This note was created with Dragon Medical transcription system. Any errors in dictation  are purely unintentional.

## 2018-06-08 NOTE — Anesthesia Postprocedure Evaluation (Signed)
Anesthesia Post Note  Patient: Bobby Sandoval  Procedure(s) Performed: ENDOVASCULAR REPAIR/STENT GRAFT (N/A )  Patient location during evaluation: PACU Anesthesia Type: General Level of consciousness: awake and alert Pain management: pain level controlled Vital Signs Assessment: post-procedure vital signs reviewed and stable Respiratory status: spontaneous breathing, nonlabored ventilation, respiratory function stable and patient connected to nasal cannula oxygen Cardiovascular status: blood pressure returned to baseline and stable Postop Assessment: no apparent nausea or vomiting Anesthetic complications: no     Last Vitals:  Vitals:   06/08/18 1300 06/08/18 1400  BP:  (!) 155/84  Pulse: 73 (!) 58  Resp: 20 13  Temp:    SpO2: 96% 98%    Last Pain:  Vitals:   06/08/18 1040  TempSrc: Oral  PainSc: 0-No pain                 Sebastain,Fran Mcree S

## 2018-06-08 NOTE — Progress Notes (Signed)
Labetalol given for blood pressure of 187/83

## 2018-06-09 ENCOUNTER — Encounter: Payer: Self-pay | Admitting: Vascular Surgery

## 2018-06-09 LAB — TYPE AND SCREEN
ABO/RH(D): A NEG
ANTIBODY SCREEN: NEGATIVE
UNIT DIVISION: 0
UNIT DIVISION: 0

## 2018-06-09 LAB — CBC
HEMATOCRIT: 50.3 % (ref 40.0–52.0)
HEMOGLOBIN: 17.3 g/dL (ref 13.0–18.0)
MCH: 33.2 pg (ref 26.0–34.0)
MCHC: 34.5 g/dL (ref 32.0–36.0)
MCV: 96.3 fL (ref 80.0–100.0)
Platelets: 148 10*3/uL — ABNORMAL LOW (ref 150–440)
RBC: 5.22 MIL/uL (ref 4.40–5.90)
RDW: 13.7 % (ref 11.5–14.5)
WBC: 12.3 10*3/uL — ABNORMAL HIGH (ref 3.8–10.6)

## 2018-06-09 LAB — BASIC METABOLIC PANEL
Anion gap: 10 (ref 5–15)
BUN: 9 mg/dL (ref 8–23)
CHLORIDE: 102 mmol/L (ref 98–111)
CO2: 25 mmol/L (ref 22–32)
CREATININE: 0.94 mg/dL (ref 0.61–1.24)
Calcium: 8.9 mg/dL (ref 8.9–10.3)
GFR calc Af Amer: >60 mL/min (ref >60)
GFR calc non Af Amer: >60 mL/min (ref >60)
GLUCOSE: 124 mg/dL — AB (ref 70–99)
POTASSIUM: 4 mmol/L (ref 3.5–5.1)
SODIUM: 137 mmol/L (ref 135–145)

## 2018-06-09 LAB — PREPARE RBC (CROSSMATCH)

## 2018-06-09 LAB — BPAM RBC
Blood Product Expiration Date: 201909142359
Blood Product Expiration Date: 201909182359
Unit Type and Rh: 600
Unit Type and Rh: 600

## 2018-06-09 MED ORDER — TRAMADOL HCL 50 MG PO TABS: 50.0000 mg | ORAL_TABLET | Freq: Four times a day (QID) | ORAL | 0 refills | Status: DC | PRN

## 2018-06-09 MED ORDER — FAMOTIDINE 20 MG PO TABS
20.0000 mg | ORAL_TABLET | Freq: Two times a day (BID) | ORAL | Status: DC
  Administered 2018-06-09: 20 mg via ORAL
  Filled 2018-06-09: qty 1

## 2018-06-09 MED ORDER — CLOPIDOGREL BISULFATE 75 MG PO TABS: 75.0000 mg | ORAL_TABLET | Freq: Every day | ORAL | 11 refills | Status: DC

## 2018-06-09 NOTE — Progress Notes (Signed)
Patient with discharge instruction on CHL.  Ambulated well with no distress.  Discharge instructions and prescription given to patient with no further questions.  IV removed x2 with catheter intact.  Patient discharge via wheelchair accompanied by auxillary staff.

## 2018-06-09 NOTE — Discharge Summary (Signed)
[facility]    Discharge Summary    Patient ID:  Bobby Sandoval MRN: [id number] DOB/AGE: Feb 10, 1946 72 y.o.  Admit date: 06/08/2018 Discharge date: 06/09/2018 Date of Surgery: 06/08/2018 Surgeon: Surgeon(s): Avenir Lozinski, Marlow Baars, MD Schnier, Latina Craver, MD  Admission Diagnosis: Endovascular AAA repair   GORE Rep  cc: Blondell Reveal  Discharge Diagnoses:  Endovascular AAA repair   GORE Rep  cc: Blondell Reveal  Secondary Diagnoses: Past Medical History:  Diagnosis Date  . AAA (abdominal aortic aneurysm) (HCC)    a. 2012 3.4 cm; b. 07/2017 Abd u/s: Dist abd Ao dil up to 4.8cm; c. 03/2018 CT Abd: infrarenal AAA 5.3 x 5.7cm.  Marland Kitchen CAD (coronary artery disease)    a. 2007 Inf MI w/ BMS to RCA x 2; b. 2008 PCI/DES to mid LAD @ [facility]; c. 07/2015 Cath: LAD 100 prox to prev placed stent w/ R->L collats, RCA stents patent. EF 50-55%. Med Rx.  Marland Kitchen COPD (chronic obstructive pulmonary disease) (HCC)   . Diastolic dysfunction    a. 02/2014 Echo: EF 55-65%. Gr1 DD, no rwma, mildly dil LA, nl RV fxn; b. 01/2018 Echo: EF 60-65%, Gr1 DD, mild MR, mildly dil LA. nl RV fxn. Nl PASP.  Marland Kitchen ETOH abuse   . Hepatic steatosis    a. 03/2018 CT Abd: hepatic steatosis and possible early cirrhotic morphology of liver.  Marland Kitchen History of kidney stones   . Hyperlipidemia   . Hypertension   . MI (myocardial infarction) (HCC)   . Obesity   . Panic disorder   . Pneumonia   . Tobacco abuse     Procedure(s): ENDOVASCULAR REPAIR/STENT GRAFT  Discharged Condition: good  HPI:  Here for 5.7 cm AAA  Hospital Course:  Bobby Sandoval is a 72 y.o. male is S/P Neither Procedure(s): ENDOVASCULAR REPAIR/STENT GRAFT Extubated: POD # 0 Physical exam: access sites C/D/I, feet warm Post-op wounds clean, dry, intact,  healing well Pt. Ambulating, voiding and taking PO diet without difficulty. Pt pain controlled with PO pain meds. Labs as below Complications:none  Consults:     Significant Diagnostic Studies: CBC Lab Results  Component Value Date   WBC 12.3 (H) 06/09/2018   HGB 17.3 06/09/2018   HCT 50.3 06/09/2018   MCV 96.3 06/09/2018   PLT 148 (L) 06/09/2018    BMET    Component Value Date/Time   NA 137 06/09/2018 0523   NA 138 02/03/2018 0902   K 4.0 06/09/2018 0523   CL 102 06/09/2018 0523   CO2 25 06/09/2018 0523   GLUCOSE 124 (H) 06/09/2018 0523   BUN 9 06/09/2018 0523   BUN 7 (L) 02/03/2018 0902   CREATININE 0.94 06/09/2018 0523   CALCIUM 8.9 06/09/2018 0523   GFRNONAA >60 06/09/2018 0523   GFRAA >60 06/09/2018 0523   COAG Lab Results  Component Value Date   INR 0.93 06/01/2018   INR 1.05 07/19/2015     Disposition:  Discharge to :Home Discharge Instructions    Call MD for:  redness, tenderness, or signs of infection (pain, swelling, bleeding, redness, odor or green/yellow discharge around incision site)   Complete by:  As directed    Call MD for:  severe or increased pain, loss or decreased feeling  in affected limb(s)   Complete by:  As directed    Call MD for:  temperature >100.5   Complete by:  As directed    Driving Restrictions  Complete by:  As directed    No driving for 24 hours   Lifting restrictions   Complete by:  As directed    No lifting for 3 days   No dressing needed   Complete by:  As directed    Replace only if drainage present   Resume previous diet   Complete by:  As directed      Allergies as of 06/09/2018   No Known Allergies     Medication List    TAKE these medications   ALPRAZolam 0.5 MG tablet Commonly known as:  XANAX Take 0.5 mg by mouth 3 (three) times daily as needed for anxiety.   aspirin EC 81 MG tablet Take 1 tablet (81 mg total) by mouth daily.   atorvastatin 80 MG tablet Commonly known as:  LIPITOR Take 1 tablet (80 mg total) by mouth daily.   clopidogrel 75 MG tablet Commonly known as:  PLAVIX Take 1 tablet (75 mg total) by mouth daily.   enalapril 20 MG  tablet Commonly known as:  VASOTEC Take 1 tablet (20 mg total) by mouth 2 (two) times daily.   nitroGLYCERIN 0.4 MG SL tablet Commonly known as:  NITROSTAT Place 0.4 mg under the tongue every 5 (five) minutes as needed for chest pain.   potassium chloride 10 MEQ tablet Commonly known as:  K-DUR Take 2 tablets (20 mEq total) by mouth daily.   torsemide 20 MG tablet Commonly known as:  DEMADEX Take 1 tablet (20 mg total) by mouth daily.   traMADol 50 MG tablet Commonly known as:  ULTRAM Take 1 tablet (50 mg total) by mouth every 6 (six) hours as needed.      Verbal and written Discharge instructions given to the patient. Wound care per Discharge AVS Follow-up Information    Georgiana Spinner, NP Follow up in 4 week(s).   Specialty:  Nurse Practitioner Why:  with [facility] Contact information: [address] [place] [address] [phone]           Signed: Festus Barren, MD  06/09/2018, 10:42 AM

## 2018-06-09 NOTE — Progress Notes (Signed)
PHARMACIST - PHYSICIAN COMMUNICATION  Dr. Wyn Quaker  CONCERNING: IV to Oral Route Change Policy  RECOMMENDATION: This patient is receiving famotidine by the intravenous route.  Based on criteria approved by the Pharmacy and Therapeutics Committee, the intravenous medication(s) is/are being converted to the equivalent oral dose form(s).   DESCRIPTION: These criteria include:  The patient is eating (either orally or via tube) and/or has been taking other orally administered medications for a least 24 hours  The patient has no evidence of active gastrointestinal bleeding or impaired GI absorption (gastrectomy, short bowel, patient on TNA or NPO).  If you have questions about this conversion, please contact the Pharmacy Department  []   [phone] )  [facility] [x]   [phone] )  [facility] []   [phone] )  [facility] []   [phone] )  [facility] []   [phone] )  Ilene Qua   Pricilla Riffle, PharmD Pharmacy Resident  06/09/2018 9:56 AM

## 2018-07-04 ENCOUNTER — Other Ambulatory Visit (INDEPENDENT_AMBULATORY_CARE_PROVIDER_SITE_OTHER): Payer: Self-pay | Admitting: Vascular Surgery

## 2018-07-04 DIAGNOSIS — Z95828 Presence of other vascular implants and grafts: Secondary | ICD-10-CM

## 2018-07-06 ENCOUNTER — Encounter (INDEPENDENT_AMBULATORY_CARE_PROVIDER_SITE_OTHER): Payer: Self-pay | Admitting: Nurse Practitioner

## 2018-07-06 ENCOUNTER — Ambulatory Visit (INDEPENDENT_AMBULATORY_CARE_PROVIDER_SITE_OTHER): Payer: Medicare Other | Admitting: Nurse Practitioner

## 2018-07-06 ENCOUNTER — Ambulatory Visit (INDEPENDENT_AMBULATORY_CARE_PROVIDER_SITE_OTHER): Payer: Medicare Other

## 2018-07-06 VITALS — BP 140/69 | HR 64 | Resp 17 | Ht 74.0 in | Wt 209.0 lb

## 2018-07-06 DIAGNOSIS — I10 Essential (primary) hypertension: Secondary | ICD-10-CM

## 2018-07-06 DIAGNOSIS — E785 Hyperlipidemia, unspecified: Secondary | ICD-10-CM

## 2018-07-06 DIAGNOSIS — Z95828 Presence of other vascular implants and grafts: Secondary | ICD-10-CM | POA: Diagnosis not present

## 2018-07-06 DIAGNOSIS — I714 Abdominal aortic aneurysm, without rupture, unspecified: Secondary | ICD-10-CM

## 2018-07-06 DIAGNOSIS — F17210 Nicotine dependence, cigarettes, uncomplicated: Secondary | ICD-10-CM

## 2018-07-06 NOTE — Progress Notes (Signed)
Subjective:    Patient ID: Bobby Sandoval, male    DOB: 12/08/1945, 72 y.o.   MRN: [id number] Chief Complaint  Patient presents with  . Follow-up    4 week EVAR follow up    HPI  Bobby Sandoval is a 72 y.o. male that returns to the office for surveillance of an abdominal aortic aneurysm status post stent graft placement on 06/08/2018.   Patient denies abdominal pain or back pain, no other abdominal complaints. No groin related complaints. No symptoms consistent with distal embolization No changes in claudication distance.   There have been no interval changes in his overall healthcare since his last visit.   Patient denies amaurosis fugax or TIA symptoms. There is no history of claudication or rest pain symptoms of the lower extremities. The patient denies angina or shortness of breath.   Duplex US of the aorta and iliac arteries shows a 5.6cm AAA sac with no endoleak,  5.7 cm in the sac compared to the previous study.  Review of Systems   Review of Systems: Negative Unless Checked Constitutional: [] Weight loss  [] Fever  [] Chills Cardiac: [] Chest pain   ? Atrial Fibrillation  [] Palpitations   [] Shortness of breath when laying flat   [] Shortness of breath with exertion. Vascular:  [] Pain in legs with walking   [] Pain in legs with standing  [] History of DVT   [] Phlebitis   [] Swelling in legs   [] Varicose veins   [] Non-healing ulcers Pulmonary:   [] Uses home oxygen   [] Productive cough   [] Hemoptysis   [] Wheeze  [] COPD   [] Asthma Neurologic:  [] Dizziness   [] Seizures   [] History of stroke   [] History of TIA  [] Aphasia   [] Vissual changes   [] Weakness or numbness in arm   [] Weakness or numbness in leg Musculoskeletal:   [] Joint swelling   [] Joint pain   [] Low back pain  ? History of Knee Replacement Hematologic:  [] Easy bruising  [] Easy bleeding   [] Hypercoagulable state   [] Anemic Gastrointestinal:  [] Diarrhea   [] Vomiting  [] Gastroesophageal reflux/heartburn   [] Difficulty  swallowing. Genitourinary:  [] Chronic kidney disease   [] Difficult urination  [] Anuric   [] Blood in urine Skin:  [] Rashes   [] Ulcers  Psychological:  [] History of anxiety   []  History of major depression  ? Memory Difficulties     Objective:   Physical Exam  BP 140/69 (BP Location: Right Arm, Patient Position: Sitting)   Pulse 64   Resp 17   Ht 6\' 2"  (1.88 m)   Wt 209 lb (94.8 kg)   BMI 26.83 kg/m   Past Medical History:  Diagnosis Date  . AAA (abdominal aortic aneurysm) (HCC)    a. 2012 3.4 cm; b. 07/2017 Abd u/s: Dist abd Ao dil up to 4.8cm; c. 03/2018 CT Abd: infrarenal AAA 5.3 x 5.7cm.  Marland Kitchen CAD (coronary artery disease)    a. 2007 Inf MI w/ BMS to RCA x 2; b. 2008 PCI/DES to mid LAD @ [facility]; c. 07/2015 Cath: LAD 100 prox to prev placed stent w/ R->L collats, RCA stents patent. EF 50-55%. Med Rx.  Marland Kitchen COPD (chronic obstructive pulmonary disease) (HCC)   . Diastolic dysfunction    a. 02/2014 Echo: EF 55-65%. Gr1 DD, no rwma, mildly dil LA, nl RV fxn; b. 01/2018 Echo: EF 60-65%, Gr1 DD, mild MR, mildly dil LA. nl RV fxn. Nl PASP.  Marland Kitchen ETOH abuse   . Hepatic steatosis    a. 03/2018 CT Abd: hepatic steatosis and possible  early cirrhotic morphology of liver.  Marland Kitchen History of kidney stones   . Hyperlipidemia   . Hypertension   . MI (myocardial infarction) (HCC)   . Obesity   . Panic disorder   . Pneumonia   . Tobacco abuse      Gen: WD/WN, NAD Head: [place]/AT, No temporalis wasting.  Ear/Nose/Throat: Hearing grossly intact, nares w/o erythema or drainage Eyes: PER, EOMI, sclera nonicteric.  Neck: Supple, no masses.  No JVD.  Pulmonary:  Good air movement, no use of accessory muscles.  Cardiac: RRR Vascular: no bruising at groin  Vessel Right Left  Dorsalis Pedis Palpable Palpable  Posterior Tibial Palpable Palpable   Gastrointestinal: soft, non-distended. No guarding/no peritoneal signs.  Musculoskeletal: M/S 5/5 throughout.  No deformity or atrophy.  Neurologic: Pain and light touch  intact in extremities.  Symmetrical.  Speech is fluent. Motor exam as listed above. Psychiatric: Judgment intact, Mood & affect appropriate for pt's clinical situation. Dermatologic: No Venous rashes. No Ulcers Noted.  No changes consistent with cellulitis. Lymph : No Cervical lymphadenopathy, no lichenification or skin changes of chronic lymphedema.   Social History   Socioeconomic History  . Marital status: Married    Spouse name: Not on file  . Number of children: Not on file  . Years of education: Not on file  . Highest education level: Not on file  Occupational History  . Not on file  Social Needs  . Financial resource strain: Not on file  . Food insecurity:    Worry: Not on file    Inability: Not on file  . Transportation needs:    Medical: Not on file    Non-medical: Not on file  Tobacco Use  . Smoking status: Current Every Day Smoker    Packs/day: 2.00    Years: 50.00    Pack years: 100.00    Types: Cigarettes  . Smokeless tobacco: Never Used  Substance and Sexual Activity  . Alcohol use: Yes    Alcohol/week: 84.0 standard drinks    Types: 84 Cans of beer per week    Comment: daily  . Drug use: No  . Sexual activity: Not on file  Lifestyle  . Physical activity:    Days per week: Not on file    Minutes per session: Not on file  . Stress: Not on file  Relationships  . Social connections:    Talks on phone: Not on file    Gets together: Not on file    Attends religious service: Not on file    Active member of club or organization: Not on file    Attends meetings of clubs or organizations: Not on file    Relationship status: Not on file  . Intimate partner violence:    Fear of current or ex partner: Not on file    Emotionally abused: Not on file    Physically abused: Not on file    Forced sexual activity: Not on file  Other Topics Concern  . Not on file  Social History Narrative  . Not on file    Past Surgical History:  Procedure Laterality Date  .  BACK SURGERY    . CARDIAC CATHETERIZATION  08/2006   x1 stent [facility]. Mid RCA: 3.5 x 16 mm overlap with 3.5 x 24 mm Liberte bare-metal stents  . CARDIAC CATHETERIZATION  10/2006   x1 stent [facility]: Mid LAD: 2.5 x 28 mm Cypher drug-eluting  . CARDIAC CATHETERIZATION N/A 07/25/2015   Procedure: Left Heart Cath  and Coronary Angiography;  Surgeon: Iran Ouch, MD;  Location: [facility] INVASIVE CV [facility];  Service: Cardiovascular;  Laterality: N/A;  . COLONOSCOPY    . coronary stents    . ENDOVASCULAR REPAIR/STENT GRAFT N/A 06/08/2018   Procedure: ENDOVASCULAR REPAIR/STENT GRAFT;  Surgeon: Annice Needy, MD;  Location: [facility] INVASIVE CV LAB;  Service: Cardiovascular;  Laterality: N/A;  . MICRODISCECTOMY LUMBAR      Family History  Problem Relation Age of Onset  . Hypertension Mother   . Heart disease Father     No Known Allergies     Assessment & Plan:   1. AAA (abdominal aortic aneurysm) without rupture (HCC) Recommend: Patient is status post successful endovascular repair of the AAA.   No further intervention is required at this time.   No endoleak is detected and the aneurysm sac is stable.  The patient will continue antiplatelet therapy as prescribed as well as aggressive management of hyperlipidemia. Exercise is again strongly encouraged.   However, endografts require continued surveillance with ultrasound or CT scan. This is mandatory to detect any changes that allow repressurization of the aneurysm sac.  The patient is informed that this would be asymptomatic.  The patient is reminded that lifelong routine surveillance is a necessity with an endograft. Patient will continue to follow-up at 6 month intervals with ultrasound of the aorta. - VAS US AORTA/IVC/ILIACS; Future  2. Essential hypertension Continue antihypertensive medications as already ordered, these medications have been reviewed and there are no changes at this time.   3. Hyperlipidemia, unspecified hyperlipidemia type Continue  statin as ordered and reviewed, no changes at this time   Current Outpatient Medications on File Prior to Visit  Medication Sig Dispense Refill  . ALPRAZolam (XANAX) 0.5 MG tablet Take 0.5 mg by mouth 3 (three) times daily as needed for anxiety.    Marland Kitchen aspirin EC 81 MG tablet Take 1 tablet (81 mg total) by mouth daily. 90 tablet 3  . atorvastatin (LIPITOR) 80 MG tablet Take 1 tablet (80 mg total) by mouth daily. 90 tablet 3  . clopidogrel (PLAVIX) 75 MG tablet Take 1 tablet (75 mg total) by mouth daily. 30 tablet 11  . enalapril (VASOTEC) 20 MG tablet Take 1 tablet (20 mg total) by mouth 2 (two) times daily. 60 tablet 3  . nitroGLYCERIN (NITROSTAT) 0.4 MG SL tablet Place 0.4 mg under the tongue every 5 (five) minutes as needed for chest pain.    . potassium chloride (K-DUR) 10 MEQ tablet Take 2 tablets (20 mEq total) by mouth daily. (Patient not taking: Reported on 06/08/2018) 60 tablet 3  . torsemide (DEMADEX) 20 MG tablet Take 1 tablet (20 mg total) by mouth daily. 90 tablet 3  . traMADol (ULTRAM) 50 MG tablet Take 1 tablet (50 mg total) by mouth every 6 (six) hours as needed. 20 tablet 0   No current facility-administered medications on file prior to visit.     There are no Patient Instructions on file for this visit. Return in about 3 months (around 10/06/2018).   Georgiana Spinner, NP

## 2018-08-03 ENCOUNTER — Encounter (INDEPENDENT_AMBULATORY_CARE_PROVIDER_SITE_OTHER): Payer: Medicare Other | Admitting: Ophthalmology

## 2018-08-03 DIAGNOSIS — H353132 Nonexudative age-related macular degeneration, bilateral, intermediate dry stage: Secondary | ICD-10-CM

## 2018-08-03 DIAGNOSIS — H35033 Hypertensive retinopathy, bilateral: Secondary | ICD-10-CM | POA: Diagnosis not present

## 2018-08-03 DIAGNOSIS — I10 Essential (primary) hypertension: Secondary | ICD-10-CM | POA: Diagnosis not present

## 2018-08-03 DIAGNOSIS — H43813 Vitreous degeneration, bilateral: Secondary | ICD-10-CM

## 2018-08-03 DIAGNOSIS — H2513 Age-related nuclear cataract, bilateral: Secondary | ICD-10-CM

## 2018-08-19 ENCOUNTER — Other Ambulatory Visit: Payer: Self-pay | Admitting: Cardiovascular Disease

## 2018-08-19 MED ORDER — ENALAPRIL MALEATE 20 MG PO TABS: 20.0000 mg | ORAL_TABLET | Freq: Two times a day (BID) | ORAL | 3 refills | Status: AC

## 2018-08-19 NOTE — Telephone Encounter (Signed)
°*  STAT* If patient is at the pharmacy, call can be transferred to refill team.   1. Which medications need to be refilled? (please list name of each medication and dose if known)  Enalapril  2. Which pharmacy/location (including street and city if local pharmacy) is medication to be sent to?  [facility] in [place]  3. Do they need a 30 day or 90 day supply? 90 day

## 2018-08-19 NOTE — Telephone Encounter (Signed)
[facility]

## 2018-10-06 ENCOUNTER — Ambulatory Visit (INDEPENDENT_AMBULATORY_CARE_PROVIDER_SITE_OTHER): Payer: Medicare Other | Admitting: Nurse Practitioner

## 2018-10-06 ENCOUNTER — Other Ambulatory Visit (INDEPENDENT_AMBULATORY_CARE_PROVIDER_SITE_OTHER): Payer: Medicare Other

## 2019-01-05 ENCOUNTER — Ambulatory Visit (INDEPENDENT_AMBULATORY_CARE_PROVIDER_SITE_OTHER): Payer: Medicare Other | Admitting: Nurse Practitioner

## 2019-01-05 ENCOUNTER — Other Ambulatory Visit (INDEPENDENT_AMBULATORY_CARE_PROVIDER_SITE_OTHER): Payer: Medicare Other

## 2019-01-11 ENCOUNTER — Telehealth: Payer: Self-pay | Admitting: Cardiovascular Disease

## 2019-01-11 ENCOUNTER — Telehealth: Payer: Self-pay

## 2019-01-11 NOTE — Telephone Encounter (Signed)
TELEPHONE CONSENT OBTAINED VERBALLY   CARDIOLOGY TEAM HAS ARRANGED FOR AN E-VISIT FOR YOUR APPOINTMENT - PLEASE REVIEW IMPORTANT INFORMATION BELOW SEVERAL DAYS PRIOR TO YOUR APPOINTMENT  Due to the recent COVID-19 pandemic, we are transitioning in-person office visits to tele-medicine visits in an effort to decrease unnecessary exposure to our patients and staff. Medicare and most insurances are covering these visits without a copay needed. We also encourage you to sign up for MyChart if you have not already done so. You will need a smartphone if possible. For patients that do not have this, we can still complete the visit using a regular telephone but do prefer a smartphone to enable video when possible. You may have a close family member that lives with you that can help. If possible, we also ask that you have a blood pressure cuff and scale at home to measure your blood pressure, heart rate and weight prior to your scheduled appointment. Patients with clinical needs that need an in-person evaluation and testing will still be able to come to the office if absolutely necessary. If you have any questions, feel free to call our office.    IF YOU HAVE A SMARTPHONE, PLEASE DOWNLOAD THE WEBEX APP TO YOUR SMARTPHONE  - If Apple, go to [employer] and type in WebEx in the search bar. Download [employer], the blue/green circle. The app is free but as with any other app download, your phone may require you to verify saved payment information or Apple password. You do NOT have to create a WebEx account.  - If Android, go to [employer] and type in [employer] in the search bar. Download [employer], the blue/green circle. The app is free but as with any other app download, your phone may require you to verify saved payment information or Android password. You do NOT have to create a WebEx account.  It is very helpful to have this downloaded before your visit.    2-3 DAYS BEFORE YOUR  APPOINTMENT  You will receive a telephone call from one of our [facility] team members - your caller ID may say "Unknown caller." If this is a video visit, we will confirm that you have been able to download the WebEx app. We will remind you check your blood pressure, heart rate and weight prior to your scheduled appointment. If you have an Apple Watch or Kardia, please upload any pertinent ECG strips the day before or morning of your appointment to MyChart. Our staff will also make sure you have reviewed the consent and agree to move forward with your scheduled tele-health visit.     THE DAY OF YOUR APPOINTMENT  Approximately 15 minutes prior to your scheduled appointment, you will receive a telephone call from one of [facility] team - your caller ID may say "Unknown caller."  Our staff will confirm medications, vital signs for the day and any symptoms you may be experiencing. Please have this information available prior to the time of visit start. It may also be helpful for you to have a pad of paper and pen handy for any instructions given during your visit. They will also walk you through joining the WebEx smartphone meeting if this is a video visit.    CONSENT FOR TELE-HEALTH VISIT - PLEASE REVIEW  I hereby voluntarily request, consent and authorize [facility] and its employed or contracted physicians, physician assistants, nurse practitioners or other licensed health care professionals (the Practitioner), to provide me with telemedicine health  care services (the "Services") as deemed necessary by the treating Practitioner. I acknowledge and consent to receive the Services by the Practitioner via telemedicine. I understand that the telemedicine visit will involve communicating with the Practitioner through live audiovisual communication technology and the disclosure of certain medical information by electronic transmission. I acknowledge that I have been given the opportunity to request an  in-person assessment or other available alternative prior to the telemedicine visit and am voluntarily participating in the telemedicine visit.  I understand that I have the right to withhold or withdraw my consent to the use of telemedicine in the course of my care at any time, without affecting my right to future care or treatment, and that the Practitioner or I may terminate the telemedicine visit at any time. I understand that I have the right to inspect all information obtained and/or recorded in the course of the telemedicine visit and may receive copies of available information for a reasonable fee.  I understand that some of the potential risks of receiving the Services via telemedicine include:  Marland Kitchen. Delay or interruption in medical evaluation due to technological equipment failure or disruption; . Information transmitted may not be sufficient (e.g. poor resolution of images) to allow for appropriate medical decision making by the Practitioner; and/or  . In rare instances, security protocols could fail, causing a breach of personal health information.  Furthermore, I acknowledge that it is my responsibility to provide information about my medical history, conditions and care that is complete and accurate to the best of my ability. I acknowledge that Practitioner's advice, recommendations, and/or decision may be based on factors not within their control, such as incomplete or inaccurate data provided by me or distortions of diagnostic images or specimens that may result from electronic transmissions. I understand that the practice of medicine is not an exact science and that Practitioner makes no warranties or guarantees regarding treatment outcomes. I acknowledge that I will receive a copy of this consent concurrently upon execution via email to the email address I last provided but may also request a printed copy by calling the office of [facility].    I understand that my insurance will be  billed for this visit.   I have read or had this consent read to me. . I understand the contents of this consent, which adequately explains the benefits and risks of the Services being provided via telemedicine.  . I have been provided ample opportunity to ask questions regarding this consent and the Services and have had my questions answered to my satisfaction. . I give my informed consent for the services to be provided through the use of telemedicine in my medical care  By participating in this telemedicine visit I agree to the above. YES

## 2019-01-11 NOTE — Telephone Encounter (Signed)
Pending consent for Evisit

## 2019-01-12 ENCOUNTER — Telehealth (INDEPENDENT_AMBULATORY_CARE_PROVIDER_SITE_OTHER): Payer: Medicare Other | Admitting: Cardiovascular Disease

## 2019-01-12 ENCOUNTER — Other Ambulatory Visit: Payer: Self-pay

## 2019-01-12 ENCOUNTER — Encounter: Payer: Self-pay | Admitting: Cardiovascular Disease

## 2019-01-12 DIAGNOSIS — I714 Abdominal aortic aneurysm, without rupture: Secondary | ICD-10-CM | POA: Diagnosis not present

## 2019-01-12 DIAGNOSIS — I2510 Atherosclerotic heart disease of native coronary artery without angina pectoris: Secondary | ICD-10-CM | POA: Diagnosis not present

## 2019-01-12 DIAGNOSIS — I5030 Unspecified diastolic (congestive) heart failure: Secondary | ICD-10-CM | POA: Diagnosis not present

## 2019-01-12 DIAGNOSIS — I10 Essential (primary) hypertension: Secondary | ICD-10-CM | POA: Diagnosis not present

## 2019-01-12 DIAGNOSIS — F17210 Nicotine dependence, cigarettes, uncomplicated: Secondary | ICD-10-CM

## 2019-01-12 DIAGNOSIS — F1010 Alcohol abuse, uncomplicated: Secondary | ICD-10-CM

## 2019-01-12 NOTE — Progress Notes (Signed)
Virtual Visit via Telephone Note   This visit type was conducted due to national recommendations for restrictions regarding the COVID-19 Pandemic (e.g. social distancing) in an effort to limit this patient's exposure and mitigate transmission in our community.  Due to his co-morbid illnesses, this patient is at least at moderate risk for complications without adequate follow up.  This format is felt to be most appropriate for this patient at this time.  The patient did not have access to video technology/had technical difficulties with video requiring transitioning to audio format only (telephone).  All issues noted in this document were discussed and addressed.  No physical exam could be performed with this format.  Please refer to the patient's chart for his  consent to telehealth for [facility].   Evaluation Performed:  Follow-up visit  Date:  01/12/2019   ID:  Bobby, Sandoval 1946/01/03, MRN [id number]  Patient Location: Home  Provider Location: Office  PCP:  Gracelyn Nurse, MD  Cardiologist:  Lorine Bears, MD  Electrophysiologist:  None   Chief Complaint: Follow-up visit  History of Present Illness:    Bobby Sandoval is a 73 y.o. male who presents via audio/video conferencing for a telehealth visit today.   This is a follow-up visit regarding coronary artery disease.  He has known history of coronary artery disease status post RCA and LAD stenting, abdominal aortic aneurysm, chronic diastolic heart failure, excessive alcohol use, COPD, hypertension, hyperlipidemia and tobacco use. He is status post inferior myocardial infarction requiring bare-metal stenting x2 to the mid RCA in 2007.  He subsequently underwent PCI drug-eluting stent placement to the mid LAD in 2008.  In October 2016, he underwent diagnostic catheterization revealing an occluded mid LAD proximal to the previously placed stent with diffuse disease and right to left collaterals.  RCA stents were patent.   EF is 50 to 55%.  Medical therapy was recommended.    He was diagnosed with large abdominal aortic aneurysm last year and underwent endovascular repair by Dr. Wyn Quaker in September 2019. He has been doing well overall with no recent chest pain or worsening dyspnea.  He does have leg edema which is reasonably controlled with torsemide.  He has been taking his medications regularly.  Unfortunately, he continues to smoke and also drinks excessive alcohol. he drinks 6-12 beers a day.  The patient does not have symptoms concerning for COVID-19 infection (fever, chills, cough, or new shortness of breath).    Past Medical History:  Diagnosis Date  . AAA (abdominal aortic aneurysm) (HCC)    a. 2012 3.4 cm; b. 07/2017 Abd u/s: Dist abd Ao dil up to 4.8cm; c. 03/2018 CT Abd: infrarenal AAA 5.3 x 5.7cm.  Marland Kitchen CAD (coronary artery disease)    a. 2007 Inf MI w/ BMS to RCA x 2; b. 2008 PCI/DES to mid LAD @ [facility]; c. 07/2015 Cath: LAD 100 prox to prev placed stent w/ R->L collats, RCA stents patent. EF 50-55%. Med Rx.  Marland Kitchen COPD (chronic obstructive pulmonary disease) (HCC)   . Diastolic dysfunction    a. 02/2014 Echo: EF 55-65%. Gr1 DD, no rwma, mildly dil LA, nl RV fxn; b. 01/2018 Echo: EF 60-65%, Gr1 DD, mild MR, mildly dil LA. nl RV fxn. Nl PASP.  Marland Kitchen ETOH abuse   . Hepatic steatosis    a. 03/2018 CT Abd: hepatic steatosis and possible early cirrhotic morphology of liver.  Marland Kitchen History of kidney stones   . Hyperlipidemia   . Hypertension   .  MI (myocardial infarction) (HCC)   . Obesity   . Panic disorder   . Pneumonia   . Tobacco abuse    Past Surgical History:  Procedure Laterality Date  . BACK SURGERY    . CARDIAC CATHETERIZATION  08/2006   x1 stent [facility]. Mid RCA: 3.5 x 16 mm overlap with 3.5 x 24 mm Liberte bare-metal stents  . CARDIAC CATHETERIZATION  10/2006   x1 stent [facility]: Mid LAD: 2.5 x 28 mm Cypher drug-eluting  . CARDIAC CATHETERIZATION N/A 07/25/2015   Procedure: Left Heart Cath and Coronary Angiography;   Surgeon: Iran Ouch, MD;  Location: [facility] INVASIVE CV LAB;  Service: Cardiovascular;  Laterality: N/A;  . COLONOSCOPY    . coronary stents    . ENDOVASCULAR REPAIR/STENT GRAFT N/A 06/08/2018   Procedure: ENDOVASCULAR REPAIR/STENT GRAFT;  Surgeon: Annice Needy, MD;  Location: [facility] INVASIVE CV LAB;  Service: Cardiovascular;  Laterality: N/A;  . MICRODISCECTOMY LUMBAR       Current Meds  Medication Sig  . ALPRAZolam (XANAX) 0.5 MG tablet Take 0.5 mg by mouth 3 (three) times daily as needed for anxiety.  Marland Kitchen aspirin EC 81 MG tablet Take 1 tablet (81 mg total) by mouth daily.  Marland Kitchen atorvastatin (LIPITOR) 80 MG tablet Take 1 tablet (80 mg total) by mouth daily.  . clopidogrel (PLAVIX) 75 MG tablet Take 1 tablet (75 mg total) by mouth daily.  . enalapril (VASOTEC) 20 MG tablet Take 1 tablet (20 mg total) by mouth 2 (two) times daily.  . nitroGLYCERIN (NITROSTAT) 0.4 MG SL tablet Place 0.4 mg under the tongue every 5 (five) minutes as needed for chest pain.  . potassium chloride (K-DUR,KLOR-CON) 10 MEQ tablet Take 10 mEq by mouth 2 (two) times daily.  Marland Kitchen torsemide (DEMADEX) 20 MG tablet Take 20 mg by mouth daily.     Allergies:   Patient has no known allergies.   Social History   Tobacco Use  . Smoking status: Current Every Day Smoker    Packs/day: 2.00    Years: 50.00    Pack years: 100.00    Types: Cigarettes  . Smokeless tobacco: Never Used  Substance Use Topics  . Alcohol use: Yes    Alcohol/week: 84.0 standard drinks    Types: 84 Cans of beer per week    Comment: daily  . Drug use: No     Family Hx: The patient's family history includes Heart disease in his father; Hypertension in his mother.  ROS:   Please see the history of present illness.     All other systems reviewed and are negative.   Prior CV studies:   The following studies were reviewed today:    Labs/Other Tests and Data Reviewed:    EKG:  No ECG reviewed.  Recent Labs: 01/13/2018: BNP 85.5 02/03/2018:  ALT 20 06/09/2018: BUN 9; Creatinine, Ser 0.94; Hemoglobin 17.3; Platelets 148; Potassium 4.0; Sodium 137   Recent Lipid Panel Lab Results  Component Value Date/Time   CHOL 174 02/03/2018 09:02 AM   TRIG 64 02/03/2018 09:02 AM   HDL 53 02/03/2018 09:02 AM   CHOLHDL 3.3 02/03/2018 09:02 AM   LDLCALC 108 (H) 02/03/2018 09:02 AM    Wt Readings from Last 3 Encounters:  01/12/19 210 lb (95.3 kg)  07/06/18 209 lb (94.8 kg)  06/09/18 218 lb 11.1 oz (99.2 kg)     Objective:    Vital Signs:  BP 130/60   Pulse 60   Ht 6' (1.829 m)  Wt 210 lb (95.3 kg)   BMI 28.48 kg/m     ASSESSMENT & PLAN:    1.    Coronary artery disease involving native coronary arteries without angina: The patient has known chronically occluded LAD with right to left collaterals.  He had a stress test last year as a preop evaluation which showed evidence of LAD infarct and ischemia.  This is expected based on his coronary anatomy.  No indication for revascularization given no significant angina at the present time.  Recommend continuing medical therapy.   2.  Abdominal aortic aneurysm: Status post endovascular repair by vascular surgery.  3.  Essential hypertension: Stable on ACE inhibitor therapy.  4.  HFpEF: He reports minimal edema and seems to be euvolemic.  5.  Hyperlipidemia: He remains on atorvastatin therapy.  This is followed by his PCP.  6.  Tobacco abuse: Still smoking 2 packs a day.  He is not interested in quitting.  Cessation advised.  7.  Alcohol abuse: I discussed with him the importance of cutting down.    COVID-19 Education: The signs and symptoms of COVID-19 were discussed with the patient and how to seek care for testing (follow up with PCP or arrange E-visit).  The importance of social distancing was discussed today.  Time:   Today, I have spent 22 minutes with the patient with telehealth technology discussing the above problems.     Medication Adjustments/Labs and Tests  Ordered: Current medicines are reviewed at length with the patient today.  Concerns regarding medicines are outlined above.  Tests Ordered: No orders of the defined types were placed in this encounter.  Medication Changes: No orders of the defined types were placed in this encounter.   Disposition:  Follow up in 4 month(s)  Signed, Lorine Bears, MD  01/12/2019 9:26 AM    [facility]

## 2019-01-12 NOTE — Patient Instructions (Signed)
Medication Instructions:  Continue same medications If you need a refill on your cardiac medications before your next appointment, please call your pharmacy.   Lab work: None If you have labs (blood work) drawn today and your tests are completely normal, you will receive your results only by: . MyChart Message (if you have MyChart) OR . A paper copy in the mail If you have any lab test that is abnormal or we need to change your treatment, we will call you to review the results.  Testing/Procedures: None  Follow-Up: At [facility], you and your health needs are our priority.  As part of our continuing mission to provide you with exceptional heart care, we have created designated Provider Care Teams.  These Care Teams include your primary Cardiologist (physician) and Advanced Practice Providers (APPs -  Physician Assistants and Nurse Practitioners) who all work together to provide you with the care you need, when you need it. You will need a follow up appointment in 4 months.  Please call our office 2 months in advance to schedule this appointment.  You may see Muhammad Arida, MD or one of the following Advanced Practice Providers on your designated Care Team:   Christopher Berge, NP Ryan Dunn, PA-C . Jacquelyn Visser, PA-C  

## 2019-02-02 ENCOUNTER — Encounter (INDEPENDENT_AMBULATORY_CARE_PROVIDER_SITE_OTHER): Payer: Medicare Other | Admitting: Ophthalmology

## 2019-02-06 ENCOUNTER — Other Ambulatory Visit: Payer: Self-pay | Admitting: Physician Assistant

## 2019-02-06 NOTE — Telephone Encounter (Signed)
Please review patients chart for a refill on Torsemide.

## 2019-04-25 ENCOUNTER — Other Ambulatory Visit: Payer: Self-pay | Admitting: Cardiovascular Disease

## 2019-04-25 MED ORDER — TORSEMIDE 20 MG PO TABS: 20.0000 mg | ORAL_TABLET | Freq: Every day | ORAL | 0 refills | Status: DC

## 2019-04-25 MED ORDER — POTASSIUM CHLORIDE CRYS ER 10 MEQ PO TBCR: 10.0000 meq | EXTENDED_RELEASE_TABLET | Freq: Two times a day (BID) | ORAL | 0 refills | Status: DC

## 2019-04-25 NOTE — Telephone Encounter (Signed)
°*  STAT* If patient is at the pharmacy, call can be transferred to refill team.   1. Which medications need to be refilled? (please list name of each medication and dose if known) Toresmide 50mg , Potassium chloride 10mg  bid  2. Which pharmacy/location (including street and city if local pharmacy) is medication to be sent to? [place] [phone])  3. Do they need a 30 day or 90 day supply? 90 day

## 2019-04-26 ENCOUNTER — Telehealth: Payer: Self-pay

## 2019-04-26 NOTE — Telephone Encounter (Signed)
Sherrie from [employer] called in. Toresmide is now approved for 1 year as of 04/26/2019

## 2019-04-26 NOTE — Telephone Encounter (Signed)
Per covermymeds, torsemide PA approved. Effective from 04/26/2019 through 04/25/2020. KEY: [id number]

## 2019-04-26 NOTE — Telephone Encounter (Signed)
PA started through covermymeds Torsemide 20mg  tablets KEY: [id number]

## 2019-06-28 ENCOUNTER — Encounter (INDEPENDENT_AMBULATORY_CARE_PROVIDER_SITE_OTHER): Payer: Medicare Other | Admitting: Ophthalmology

## 2019-07-20 ENCOUNTER — Other Ambulatory Visit (INDEPENDENT_AMBULATORY_CARE_PROVIDER_SITE_OTHER): Payer: Self-pay | Admitting: Vascular Surgery

## 2019-07-20 NOTE — Telephone Encounter (Signed)
The patient medication was refilled today but per Bobby Ditch NP the patient will need to be schedule with [facility] and see Bobby Medici NP or Dr Bobby Sandoval to continue medication refills. The patient was made aware with medical recommendation and stated he will call the office back to make an appointment.

## 2019-07-20 NOTE — Telephone Encounter (Signed)
Patient was last seen in the office 07/06/2018

## 2019-08-07 ENCOUNTER — Telehealth: Payer: Self-pay | Admitting: Cardiovascular Disease

## 2019-08-07 NOTE — Telephone Encounter (Signed)
°*  STAT* If patient is at the pharmacy, call can be transferred to refill team.   1. Which medications need to be refilled? (please list name of each medication and dose if known) torsemide 20 MG 1 tablet daily   2. Which pharmacy/location (including street and city if local pharmacy) is medication to be sent to? [place]   3. Do they need a 30 day or 90 day supply? 90 day   Patient declined scheduling a follow up appointment at this time- states that there is too much going on.  Please advise.

## 2019-08-08 MED ORDER — TORSEMIDE 20 MG PO TABS: 20.0000 mg | ORAL_TABLET | Freq: Every day | ORAL | 0 refills | Status: DC

## 2019-08-08 NOTE — Telephone Encounter (Signed)
Requested Prescriptions   Signed Prescriptions Disp Refills  . torsemide (DEMADEX) 20 MG tablet 30 tablet 0    Sig: Take 1 tablet (20 mg total) by mouth daily.    Authorizing Provider: Kathlyn Sacramento A    Ordering User: Raelene Bott, Ayleen Mckinstry L

## 2019-08-08 NOTE — Telephone Encounter (Signed)
Ok to refill. Please include a note to the patient that he is to contact the office to schedule a virtual appt.

## 2019-08-08 NOTE — Telephone Encounter (Signed)
Please advise if OK to refill. Overdue for 4 mon F/U-declined to schedule F/U as noted below. Thank you!

## 2019-11-01 ENCOUNTER — Other Ambulatory Visit: Payer: Self-pay | Admitting: Internal Medicine

## 2019-11-01 DIAGNOSIS — J449 Chronic obstructive pulmonary disease, unspecified: Secondary | ICD-10-CM

## 2019-11-08 ENCOUNTER — Ambulatory Visit
Admission: RE | Admit: 2019-11-08 | Discharge: 2019-11-08 | Disposition: A | Discharge status: Home / Self Care | Payer: Medicare Other | Source: Ambulatory Visit | Attending: Internal Medicine | Admitting: Internal Medicine

## 2019-11-08 ENCOUNTER — Other Ambulatory Visit: Payer: Self-pay

## 2019-11-08 DIAGNOSIS — J449 Chronic obstructive pulmonary disease, unspecified: Secondary | ICD-10-CM | POA: Insufficient documentation

## 2019-11-08 MED ORDER — IOHEXOL 300 MG/ML  SOLN
75.0000 mL | Freq: Once | INTRAMUSCULAR | Status: AC | PRN
  Administered 2019-11-08: 14:00:00 75 mL via INTRAVENOUS

## 2020-08-22 ENCOUNTER — Encounter: Payer: Self-pay | Admitting: Urology

## 2020-08-22 ENCOUNTER — Ambulatory Visit (INDEPENDENT_AMBULATORY_CARE_PROVIDER_SITE_OTHER): Payer: Medicare Other | Admitting: Urology

## 2020-08-22 ENCOUNTER — Other Ambulatory Visit: Payer: Self-pay

## 2020-08-22 VITALS — BP 146/78 | HR 74 | Ht 72.0 in | Wt 216.0 lb

## 2020-08-22 DIAGNOSIS — N433 Hydrocele, unspecified: Secondary | ICD-10-CM | POA: Diagnosis not present

## 2020-08-22 NOTE — Progress Notes (Signed)
08/22/2020 1:07 PM   Bobby Sandoval 31-May-1946 [id number]  Referring provider: Gracelyn Nurse, MD [address] [place],  [place] [address]  Chief Complaint  Patient presents with   Testicle Pain    HPI: Bobby Sandoval is a 74 y.o.male seen at request of Dr. Letitia Libra for evaluation of a right testis mass.   ~2 month history of right hemiscrotal swelling which has been worse the past month  No significant pain, occasional mild discomfort  No bothersome LUTS  Past urologic history remarkable for nephrolithiasis with history failed ESWL and subsequent endoscopic surgery ~15 years ago  Denies dysuria or gross hematuria  PMH: Past Medical History:  Diagnosis Date   AAA (abdominal aortic aneurysm) (HCC)    a. 2012 3.4 cm; b. 07/2017 Abd u/s: Dist abd Ao dil up to 4.8cm; c. 03/2018 CT Abd: infrarenal AAA 5.3 x 5.7cm.   CAD (coronary artery disease)    a. 2007 Inf MI w/ BMS to RCA x 2; b. 2008 PCI/DES to mid LAD @ [facility]; c. 07/2015 Cath: LAD 100 prox to prev placed stent w/ R->L collats, RCA stents patent. EF 50-55%. Med Rx.   COPD (chronic obstructive pulmonary disease) (HCC)    Diastolic dysfunction    a. 02/2014 Echo: EF 55-65%. Gr1 DD, no rwma, mildly dil LA, nl RV fxn; b. 01/2018 Echo: EF 60-65%, Gr1 DD, mild MR, mildly dil LA. nl RV fxn. Nl PASP.   ETOH abuse    Hepatic steatosis    a. 03/2018 CT Abd: hepatic steatosis and possible early cirrhotic morphology of liver.   History of kidney stones    Hyperlipidemia    Hypertension    MI (myocardial infarction) (HCC)    Obesity    Panic disorder    Pneumonia    Tobacco abuse     Surgical History: Past Surgical History:  Procedure Laterality Date   BACK SURGERY     CARDIAC CATHETERIZATION  08/2006   x1 stent [facility]. Mid RCA: 3.5 x 16 mm overlap with 3.5 x 24 mm Liberte bare-metal stents   CARDIAC CATHETERIZATION  10/2006   x1 stent VA: Mid LAD: 2.5 x 28 mm Cypher drug-eluting   CARDIAC  CATHETERIZATION N/A 07/25/2015   Procedure: Left Heart Cath and Coronary Angiography;  Surgeon: Iran Ouch, MD;  Location: [facility] INVASIVE CV LAB;  Service: Cardiovascular;  Laterality: N/A;   COLONOSCOPY     coronary stents     ENDOVASCULAR REPAIR/STENT GRAFT N/A 06/08/2018   Procedure: ENDOVASCULAR REPAIR/STENT GRAFT;  Surgeon: Annice Needy, MD;  Location: [facility] INVASIVE CV LAB;  Service: Cardiovascular;  Laterality: N/A;   MICRODISCECTOMY LUMBAR      Home Medications:  Allergies as of 08/22/2020   No Known Allergies     Medication List       Accurate as of August 22, 2020  1:07 PM. If you have any questions, ask your nurse or doctor.        ALPRAZolam 0.5 MG tablet Commonly known as: XANAX Take 0.5 mg by mouth 3 (three) times daily as needed for anxiety.   aspirin EC 81 MG tablet Take 1 tablet (81 mg total) by mouth daily.   atorvastatin 80 MG tablet Commonly known as: LIPITOR Take 1 tablet (80 mg total) by mouth daily.   clopidogrel 75 MG tablet Commonly known as: PLAVIX TAKE 1 TABLET BY MOUTH ONCE A DAY   enalapril 20 MG tablet Commonly known as: Vasotec Take 1 tablet (20 mg  total) by mouth 2 (two) times daily.   nitroGLYCERIN 0.4 MG SL tablet Commonly known as: NITROSTAT Place 0.4 mg under the tongue every 5 (five) minutes as needed for chest pain.   potassium chloride 10 MEQ tablet Commonly known as: KLOR-CON Take 1 tablet (10 mEq total) by mouth 2 (two) times daily.   torsemide 20 MG tablet Commonly known as: DEMADEX Take 1 tablet (20 mg total) by mouth daily.   traMADol 50 MG tablet Commonly known as: Ultram Take 1 tablet (50 mg total) by mouth every 6 (six) hours as needed.       Allergies: No Known Allergies  Family History: Family History  Problem Relation Age of Onset   Hypertension Mother    Heart disease Father     Social History:  reports that he has been smoking cigarettes. He has a 100.00 pack-year smoking history. He has  never used smokeless tobacco. He reports current alcohol use of about 84.0 standard drinks of alcohol per week. He reports that he does not use drugs.   Physical Exam: BP (!) 146/78    Pulse 74    Ht 6' (1.829 m)    Wt 216 lb (98 kg)    BMI 29.29 kg/m   Constitutional:  Alert and oriented, No acute distress. HEENT: [place] AT, moist mucus membranes.  Trachea midline, no masses. Cardiovascular: No clubbing, cyanosis, or edema. Respiratory: Normal respiratory effort, no increased work of breathing. GI: Abdomen is soft, nontender, nondistended, no abdominal masses GU: Phallus without lesions, moderate right hydrocele, no evidence hernia.  Right testis not palpable secondary hydrocele, left testis palpably normal Skin: No rashes, bruises or suspicious lesions. Neurologic: Grossly intact, no focal deficits, moving all 4 extremities. Psychiatric: Normal mood and affect.   Assessment & Plan:    1. Right hydrocele  Minimally symptomatic  Management options discussed including observation, hydrocelectomy and aspiration.  The pros and cons of each treatment were discussed  Schedule scrotal ultrasound to document sonographically normal right testis, will call with results   Riki Altes, MD  [facility] [address], [address] [place], [place] [address] [phone]

## 2020-09-04 ENCOUNTER — Ambulatory Visit: Payer: Self-pay | Admitting: Urology

## 2020-09-05 ENCOUNTER — Other Ambulatory Visit: Payer: Self-pay

## 2020-09-05 ENCOUNTER — Ambulatory Visit
Admission: RE | Admit: 2020-09-05 | Discharge: 2020-09-05 | Disposition: A | Discharge status: Home / Self Care | Payer: Medicare Other | Source: Ambulatory Visit | Attending: Urology | Admitting: Urology

## 2020-09-05 DIAGNOSIS — N433 Hydrocele, unspecified: Secondary | ICD-10-CM | POA: Diagnosis not present

## 2020-09-09 ENCOUNTER — Telehealth: Payer: Self-pay | Admitting: *Deleted

## 2020-09-09 NOTE — Telephone Encounter (Signed)
-----   Message from Riki Altes, MD sent at 09/08/2020 11:13 AM EST ----- Scrotal ultrasound showed a large right hydrocele and a normal-appearing right testis.  We discussed options at last visit including observation or hydrocelectomy

## 2020-09-09 NOTE — Telephone Encounter (Signed)
Notified patient as instructed, Patient will call at the begin of the year and decide what to do.

## 2020-11-12 ENCOUNTER — Encounter (INDEPENDENT_AMBULATORY_CARE_PROVIDER_SITE_OTHER): Payer: Medicare Other | Admitting: Ophthalmology

## 2020-11-12 ENCOUNTER — Other Ambulatory Visit: Payer: Self-pay

## 2020-11-12 DIAGNOSIS — H35341 Macular cyst, hole, or pseudohole, right eye: Secondary | ICD-10-CM

## 2020-11-12 DIAGNOSIS — I10 Essential (primary) hypertension: Secondary | ICD-10-CM | POA: Diagnosis not present

## 2020-11-12 DIAGNOSIS — H353132 Nonexudative age-related macular degeneration, bilateral, intermediate dry stage: Secondary | ICD-10-CM

## 2020-11-12 DIAGNOSIS — H35371 Puckering of macula, right eye: Secondary | ICD-10-CM

## 2020-11-12 DIAGNOSIS — H35033 Hypertensive retinopathy, bilateral: Secondary | ICD-10-CM

## 2020-11-12 DIAGNOSIS — H43813 Vitreous degeneration, bilateral: Secondary | ICD-10-CM

## 2020-11-21 ENCOUNTER — Telehealth: Payer: Self-pay | Admitting: Cardiovascular Disease

## 2020-11-21 NOTE — Telephone Encounter (Signed)
3 attempts to schedule fu appt from recall list.   Deleting recall.   

## 2020-12-19 ENCOUNTER — Other Ambulatory Visit: Payer: Self-pay

## 2020-12-19 ENCOUNTER — Ambulatory Visit: Payer: Medicare Other | Admitting: Cardiovascular Disease

## 2020-12-19 ENCOUNTER — Encounter: Payer: Self-pay | Admitting: Cardiovascular Disease

## 2020-12-19 VITALS — BP 140/60 | HR 61 | Ht 72.0 in | Wt 211.0 lb

## 2020-12-19 DIAGNOSIS — Z720 Tobacco use: Secondary | ICD-10-CM

## 2020-12-19 DIAGNOSIS — I714 Abdominal aortic aneurysm, without rupture, unspecified: Secondary | ICD-10-CM

## 2020-12-19 DIAGNOSIS — E785 Hyperlipidemia, unspecified: Secondary | ICD-10-CM

## 2020-12-19 DIAGNOSIS — I5030 Unspecified diastolic (congestive) heart failure: Secondary | ICD-10-CM | POA: Diagnosis not present

## 2020-12-19 DIAGNOSIS — I2510 Atherosclerotic heart disease of native coronary artery without angina pectoris: Secondary | ICD-10-CM

## 2020-12-19 DIAGNOSIS — I10 Essential (primary) hypertension: Secondary | ICD-10-CM

## 2020-12-19 DIAGNOSIS — F1010 Alcohol abuse, uncomplicated: Secondary | ICD-10-CM

## 2020-12-19 MED ORDER — ATORVASTATIN CALCIUM 40 MG PO TABS: 40.0000 mg | ORAL_TABLET | Freq: Every day | ORAL | 3 refills | Status: DC

## 2020-12-19 NOTE — Progress Notes (Signed)
Cardiology Office Note   Date:  12/19/2020   ID:  Bobby Sandoval, Bobby Sandoval 09/17/1946, MRN [id number]  PCP:  Gracelyn Nurse, MD  Cardiologist:   Lorine Bears, MD   Chief Complaint  Patient presents with  . OTHER    Cardiac clearance no complaints today. Meds reviewed verbally with pt.      History of Present Illness: Bobby Sandoval is a 75 y.o. male who presents for a follow-up visit regarding coronary artery disease.  He has known history of coronary artery disease status post RCA and LAD stenting, abdominal aortic aneurysm status post endovascular repair in 2019, chronic diastolic heart failure, excessive alcohol use, COPD, hypertension, hyperlipidemia and tobacco use. He is status post inferior myocardial infarction requiring bare-metal stenting x2 to the mid RCA in 2007.  He subsequently underwent PCI drug-eluting stent placement to the mid LAD in 2008.  In October 2016, he underwent diagnostic catheterization revealing an occluded mid LAD proximal to the previously placed stent with diffuse disease and right to left collaterals.  RCA stents were patent.  EF is 50 to 55%.  Medical therapy was recommended.    He has not been seen by [place] for 2 years.  He denies any recent chest pain.  He has chronic exertional dyspnea related to prolonged tobacco use.  He continues to smoke and reports inability to quit.  In addition, he continues to drink beer daily at least a sixpack every day. He needs to have an eye surgery done and is here for clearance.    Past Medical History:  Diagnosis Date  . AAA (abdominal aortic aneurysm) (HCC)    a. 2012 3.4 cm; b. 07/2017 Abd u/s: Dist abd Ao dil up to 4.8cm; c. 03/2018 CT Abd: infrarenal AAA 5.3 x 5.7cm.  Marland Kitchen CAD (coronary artery disease)    a. 2007 Inf MI w/ BMS to RCA x 2; b. 2008 PCI/DES to mid LAD @ [facility]; c. 07/2015 Cath: LAD 100 prox to prev placed stent w/ R->L collats, RCA stents patent. EF 50-55%. Med Rx.  Marland Kitchen COPD (chronic obstructive  pulmonary disease) (HCC)   . Diastolic dysfunction    a. 02/2014 Echo: EF 55-65%. Gr1 DD, no rwma, mildly dil LA, nl RV fxn; b. 01/2018 Echo: EF 60-65%, Gr1 DD, mild MR, mildly dil LA. nl RV fxn. Nl PASP.  Marland Kitchen ETOH abuse   . Hepatic steatosis    a. 03/2018 CT Abd: hepatic steatosis and possible early cirrhotic morphology of liver.  Marland Kitchen History of kidney stones   . Hyperlipidemia   . Hypertension   . MI (myocardial infarction) (HCC)   . Obesity   . Panic disorder   . Pneumonia   . Tobacco abuse     Past Surgical History:  Procedure Laterality Date  . BACK SURGERY    . CARDIAC CATHETERIZATION  08/2006   x1 stent [facility]. Mid RCA: 3.5 x 16 mm overlap with 3.5 x 24 mm Liberte bare-metal stents  . CARDIAC CATHETERIZATION  10/2006   x1 stent [facility]: Mid LAD: 2.5 x 28 mm Cypher drug-eluting  . CARDIAC CATHETERIZATION N/A 07/25/2015   Procedure: Left Heart Cath and Coronary Angiography;  Surgeon: Iran Ouch, MD;  Location: [facility] INVASIVE CV LAB;  Service: Cardiovascular;  Laterality: N/A;  . COLONOSCOPY    . coronary stents    . ENDOVASCULAR REPAIR/STENT GRAFT N/A 06/08/2018   Procedure: ENDOVASCULAR REPAIR/STENT GRAFT;  Surgeon: Annice Needy, MD;  Location: [facility] INVASIVE CV LAB;  Service: Cardiovascular;  Laterality: N/A;  . MICRODISCECTOMY LUMBAR       Current Outpatient Medications  Medication Sig Dispense Refill  . ALPRAZolam (XANAX) 0.5 MG tablet Take 0.5 mg by mouth 3 (three) times daily as needed for anxiety.    Marland Kitchen aspirin EC 81 MG tablet Take 1 tablet (81 mg total) by mouth daily. 90 tablet 3  . atorvastatin (LIPITOR) 40 MG tablet Take 1 tablet (40 mg total) by mouth daily. 90 tablet 3  . enalapril (VASOTEC) 20 MG tablet Take 1 tablet (20 mg total) by mouth 2 (two) times daily. 60 tablet 3  . nitroGLYCERIN (NITROSTAT) 0.4 MG SL tablet Place 0.4 mg under the tongue every 5 (five) minutes as needed for chest pain.    Marland Kitchen torsemide (DEMADEX) 20 MG tablet Take 1 tablet (20 mg total) by mouth  daily. 30 tablet 0   No current facility-administered medications for this visit.    Allergies:   Patient has no known allergies.    Social History:  The patient  reports that he has been smoking cigarettes. He has a 100.00 pack-year smoking history. He has never used smokeless tobacco. He reports current alcohol use of about 84.0 standard drinks of alcohol per week. He reports that he does not use drugs.   Family History:  The patient's family history includes Heart disease in his father; Hypertension in his mother.    ROS:  Please see the history of present illness.   Otherwise, review of systems are positive for none.   All other systems are reviewed and negative.    PHYSICAL EXAM: VS:  BP 140/60 (BP Location: Left Arm, Patient Position: Sitting, Cuff Size: Normal)   Pulse 61   Ht 6' (1.829 m)   Wt 211 lb (95.7 kg)   SpO2 96%   BMI 28.62 kg/m  , BMI Body mass index is 28.62 kg/m. GEN: Well nourished, well developed, in no acute distress  HEENT: normal  Neck: no JVD, carotid bruits, or masses Cardiac: RRR; no murmurs, rubs, or gallops,no edema  Respiratory:  clear to auscultation bilaterally, normal work of breathing GI: soft, nontender, nondistended, + BS MS: no deformity or atrophy  Skin: warm and dry, no rash Neuro:  Strength and sensation are intact Psych: euthymic mood, full affect   EKG:  EKG is ordered today. EKG showed sinus bradycardia with sinus arrhythmia.  No significant ST or T wave changes.   Recent Labs: No results found for requested labs within last 8760 hours.    Lipid Panel    Component Value Date/Time   CHOL 174 02/03/2018 0902   TRIG 64 02/03/2018 0902   HDL 53 02/03/2018 0902   CHOLHDL 3.3 02/03/2018 0902   LDLCALC 108 (H) 02/03/2018 0902      Wt Readings from Last 3 Encounters:  12/19/20 211 lb (95.7 kg)  08/22/20 216 lb (98 kg)  01/12/19 210 lb (95.3 kg)         ASSESSMENT AND PLAN:  1.    Coronary artery disease involving  native coronary arteries without angina: The patient has known chronically occluded LAD with right to left collaterals.  Continue medical therapy.  2.  Abdominal aortic aneurysm: Status post endovascular repair by vascular surgery.  3.  Essential hypertension: Stable on ACE inhibitor therapy.  4.  HFpEF: He reports minimal edema and seems to be euvolemic.  5.  Hyperlipidemia: He stopped taking atorvastatin on his own.  I discussed with him the importance of statin  therapy and resumed atorvastatin 40 mg daily.  6.  Tobacco abuse: Still smoking 2 packs a day.  He reports inability to quit.  7.  Alcohol abuse: He continues to drink large amount of beer every day.  He reports that this is his brother in life and not able to quit.  8.  Preop cardiovascular evaluation for eye surgery: Patient with no anginal symptoms and good functional capacity.  No need for further cardiac evaluation before surgery.  Aspirin can be held 5 days as requested.     Disposition:   FU with me in 12 months  Signed,  Lorine Bears, MD  12/19/2020 10:17 AM    [facility]

## 2020-12-19 NOTE — Patient Instructions (Signed)
Medication Instructions:  Your physician has recommended you make the following change in your medication:   START Atorvastatin 40 mg daily. An Rx has been sent to your pharmacy.   *If you need a refill on your cardiac medications before your next appointment, please call your pharmacy*   Lab Work: None ordered If you have labs (blood work) drawn today and your tests are completely normal, you will receive your results only by: Marland Kitchen MyChart Message (if you have MyChart) OR . A paper copy in the mail If you have any lab test that is abnormal or we need to change your treatment, we will call you to review the results.   Testing/Procedures: None ordered   Follow-Up: At [facility], you and your health needs are our priority.  As part of our continuing mission to provide you with exceptional heart care, we have created designated Provider Care Teams.  These Care Teams include your primary Cardiologist (physician) and Advanced Practice Providers (APPs -  Physician Assistants and Nurse Practitioners) who all work together to provide you with the care you need, when you need it.  We recommend signing up for the patient portal called "MyChart".  Sign up information is provided on this After Visit Summary.  MyChart is used to connect with patients for Virtual Visits (Telemedicine).  Patients are able to view lab/test results, encounter notes, upcoming appointments, etc.  Non-urgent messages can be sent to your provider as well.   To learn more about what you can do with MyChart, go to [email or web address].    Your next appointment:   Your physician wants you to follow-up in: 1 year You will receive a reminder letter in the mail two months in advance. If you don't receive a letter, please call our office to schedule the follow-up appointment.   The format for your next appointment:   In Person  Provider:   You may see Lorine Bears, MD or one of the following Advanced Practice Providers  on your designated Care Team:    Nicolasa Ducking, NP  Eula Listen, PA-C  Marisue Ivan, PA-C  Cadence [place], [place]  Gillian Shields, NP    Other Instructions N/A

## 2020-12-20 ENCOUNTER — Other Ambulatory Visit: Payer: Medicare Other

## 2020-12-20 ENCOUNTER — Other Ambulatory Visit (HOSPITAL_COMMUNITY): Payer: Medicare Other

## 2020-12-24 ENCOUNTER — Encounter (INDEPENDENT_AMBULATORY_CARE_PROVIDER_SITE_OTHER): Payer: Medicare Other | Admitting: Ophthalmology

## 2021-01-10 ENCOUNTER — Other Ambulatory Visit: Payer: Self-pay

## 2021-01-10 ENCOUNTER — Other Ambulatory Visit
Admission: RE | Admit: 2021-01-10 | Discharge: 2021-01-10 | Disposition: A | Discharge status: Home / Self Care | Payer: Medicare Other | Source: Ambulatory Visit | Attending: Ophthalmology | Admitting: Ophthalmology

## 2021-01-10 DIAGNOSIS — Z20822 Contact with and (suspected) exposure to covid-19: Secondary | ICD-10-CM | POA: Diagnosis not present

## 2021-01-10 DIAGNOSIS — Z01812 Encounter for preprocedural laboratory examination: Secondary | ICD-10-CM | POA: Insufficient documentation

## 2021-01-10 LAB — SARS CORONAVIRUS 2 (TAT 6-24 HRS): SARS Coronavirus 2: NEGATIVE

## 2021-01-13 ENCOUNTER — Encounter (HOSPITAL_COMMUNITY): Payer: Self-pay | Admitting: Ophthalmology

## 2021-01-13 ENCOUNTER — Other Ambulatory Visit: Payer: Self-pay

## 2021-01-13 NOTE — Progress Notes (Signed)
PCP - Dr. Letitia Libra  Cardiologist - Dr. Kirke Corin   Chest x-ray -  EKG - 12/19/20 Stress Test -  ECHO - 01/27/18 Cardiac Cath - 2016   Blood Thinner Instructions:  Aspirin Instructions: per pt has not taken ASA in weeks   ERAS Protcol -   COVID TEST- 01/10/21 negative   Anesthesia review: yes   -------------  SDW INSTRUCTIONS:  Your procedure is scheduled on 01/14/21. Please report to [facility] Main Entrance "A" at 0900 A.M., and check in at the Admitting office. Call this number if you have problems the morning of surgery: [phone]   Remember: Do not eat or drink after midnight the night before your surgery   Medications to take morning of surgery with a sip of water include: ALPRAZolam Prudy Feeler) if needed atorvastatin (LIPITOR)  nitroGLYCERIN (NITROSTAT) if needed   As of today, STOP taking any Aspirin (unless otherwise instructed by your surgeon), Aleve, Naproxen, Ibuprofen, Motrin, Advil, Goody's, BC's, all herbal medications, fish oil, and all vitamins.    The Morning of Surgery Do not wear jewelry Do not wear lotions, powders, colognes, or deodorant Do not shave 48 hours prior to surgery.   Do not bring valuables to the hospital. [facility] is not responsible for any belongings or valuables. If you are a smoker, DO NOT Smoke 24 hours prior to surgery If you wear a CPAP at night please bring your mask the morning of surgery  Remember that you must have someone to transport you home after your surgery, and remain with you for 24 hours if you are discharged the same day. Please bring cases for contacts, glasses, hearing aids, dentures or bridgework because it cannot be worn into surgery.   Patients discharged the day of surgery will not be allowed to drive home.   Please shower the NIGHT BEFORE SURGERY and the MORNING OF SURGERY with DIAL Soap. Wear comfortable clothes the morning of surgery. Oral Hygiene is also important to reduce your risk of infection.  Remember -  BRUSH YOUR TEETH THE MORNING OF SURGERY WITH YOUR REGULAR TOOTHPASTE  Patient denies shortness of breath, fever, cough and chest pain.

## 2021-01-13 NOTE — H&P (Signed)
Bobby Sandoval is an 75 y.o. male.   Chief Complaint:Blurred vision and wavy lines right eye HPI: Blurred vision and wavy lines OD for 2 months.  Had cataract surgery Nov 2019.  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 AAA (abdominal aortic aneurysm) (HCC)    a. 2012 3.4 cm; b. 07/2017 Abd u/s: Dist abd Ao dil up to 4.8cm; c. 03/2018 CT Abd: infrarenal AAA 5.3 x 5.7cm.  Marland Kitchen CAD (coronary artery disease)    a. 2007 Inf MI w/ BMS to RCA x 2; b. 2008 PCI/DES to mid LAD @ [facility]; c. 07/2015 Cath: LAD 100 prox to prev placed stent w/ R->L collats, RCA stents patent. EF 50-55%. Med Rx.  Marland Kitchen COPD (chronic obstructive pulmonary disease) (HCC)   . Diastolic dysfunction    a. 02/2014 Echo: EF 55-65%. Gr1 DD, no rwma, mildly dil LA, nl RV fxn; b. 01/2018 Echo: EF 60-65%, Gr1 DD, mild MR, mildly dil LA. nl RV fxn. Nl PASP.  Marland Kitchen ETOH abuse   . Hepatic steatosis    a. 03/2018 CT Abd: hepatic steatosis and possible early cirrhotic morphology of liver.  Marland Kitchen History of kidney stones   . Hyperlipidemia   . Hypertension   . MI (myocardial infarction) (HCC)   . Obesity   . Panic disorder   . Pneumonia   . Tobacco abuse     Past Surgical History:  Procedure Laterality Date  . BACK SURGERY    . CARDIAC CATHETERIZATION  08/2006   x1 stent [facility]. Mid RCA: 3.5 x 16 mm overlap with 3.5 x 24 mm Liberte bare-metal stents  . CARDIAC CATHETERIZATION  10/2006   x1 stent [facility]: Mid LAD: 2.5 x 28 mm Cypher drug-eluting  . CARDIAC CATHETERIZATION N/A 07/25/2015   Procedure: Left Heart Cath and Coronary Angiography;  Surgeon: Iran Ouch, MD;  Location: [facility] INVASIVE CV LAB;  Service: Cardiovascular;  Laterality: N/A;  . COLONOSCOPY    . coronary stents    . ENDOVASCULAR REPAIR/STENT GRAFT N/A 06/08/2018   Procedure: ENDOVASCULAR REPAIR/STENT GRAFT;  Surgeon: Annice Needy, MD;  Location: [facility] INVASIVE CV LAB;  Service: Cardiovascular;  Laterality: N/A;  . MICRODISCECTOMY LUMBAR      Family  History  Problem Relation Age of Onset  . Hypertension Mother   . Heart disease Father    Social History:  reports that he has been smoking cigarettes. He has a 100.00 pack-year smoking history. He has never used smokeless tobacco. He reports current alcohol use of about 84.0 standard drinks of alcohol per week. He reports that he does not use drugs.  Allergies: No Known Allergies  No medications prior to admission.    Review of systems otherwise negative  Height 6' (1.829 m), weight 94.3 kg.  Physical exam: Mental status: oriented x3. Eyes: See eye exam associated with this date of surgery in media tab.  Scanned in by scanning center Ears, Nose, Throat: within normal limits Neck: Within Normal limits General: within normal limits Chest: Within normal limits Breast: deferred Heart: Within normal limits Abdomen: Within normal limits GU: deferred Extremities: within normal limits Skin: within normal limits  Assessment/Plan Preretinal fibrosis right eye  Macular hole right eye Plan: To [facility] for Pars plana vitrectomy, membrane peel, serum patch, laser, gas injection right eye  Sherrie George 01/13/2021, 11:08 AM

## 2021-01-13 NOTE — Anesthesia Preprocedure Evaluation (Addendum)
Anesthesia Evaluation  Patient identified by MRN, date of birth, ID band Patient awake    Reviewed: Allergy & Precautions, NPO status , Patient's Chart, lab work & pertinent test results  History of Anesthesia Complications Negative for: history of anesthetic complications  Airway Mallampati: II  TM Distance: >3 FB Neck ROM: Full    Dental  (+) Dental Advisory Given, Poor Dentition   Pulmonary COPD, Current SmokerPatient did not abstain from smoking.,    Pulmonary exam normal breath sounds clear to auscultation       Cardiovascular hypertension, Pt. on medications (-) angina+ CAD, + Past MI, + Cardiac Stents and + Peripheral Vascular Disease  Normal cardiovascular exam Rhythm:Regular Rate:Normal     Neuro/Psych PSYCHIATRIC DISORDERS Anxiety macular hole right eye negative neurological ROS     GI/Hepatic negative GI ROS, (+)     substance abuse  alcohol use,   Endo/Other  negative endocrine ROS  Renal/GU negative Renal ROS     Musculoskeletal negative musculoskeletal ROS (+)   Abdominal   Peds  Hematology negative hematology ROS (+)   Anesthesia Other Findings Day of surgery medications reviewed with the patient.  Reproductive/Obstetrics                           Anesthesia Physical Anesthesia Plan  ASA: III  Anesthesia Plan: General   Post-op Pain Management:    Induction: Intravenous  PONV Risk Score and Plan: 1 and Treatment may vary due to age or medical condition, Dexamethasone and Ondansetron  Airway Management Planned: Oral ETT  Additional Equipment:   Intra-op Plan:   Post-operative Plan: Extubation in OR  Informed Consent: I have reviewed the patients History and Physical, chart, labs and discussed the procedure including the risks, benefits and alternatives for the proposed anesthesia with the patient or authorized representative who has indicated his/her  understanding and acceptance.     Dental advisory given  Plan Discussed with: CRNA  Anesthesia Plan Comments: (PAT note by Antionette Poles, PA-C: Follows with cardiology for hx of coronary artery disease status post MI with subsequent BMS x2 to RCA 2007 and DES to LAD 2008, abdominal aortic aneurysm status post endovascular repair in 2019, chronic diastolic heart failure, hypertension, hyperlipidemia. In October 2016, he underwent diagnostic catheterization revealing an occluded mid LAD proximal to the previously placed stent with diffuse disease and right to left collaterals. RCA stents were patent. EF 50 to 55%. Medical therapy was recommended. Last seen by Dr. Kirke Corin 12/19/20 for preop clearance. Per note, "Preop cardiovascular evaluation for eye surgery: Patient with no anginal symptoms and good functional capacity.  No need for further cardiac evaluation before surgery.  Aspirin can be held 5 days as requested."  Pt was also seen by PCP Dr. Marcelino Duster 12/18/20 for preop eval. Per note, he was medically optimized but would require cardiology input (whichw as received from Dr. Kirke Corin.)  Hx of etoh abuse, reportedly drinks 36 standard drinks per week.   Hx of COPD with 80 pack year smoking history.  He will need DOS labs and evaluation.   CT Chest 11/08/19: IMPRESSION: 1. Advanced emphysematous lung disease. No evidence of pulmonary edema, consolidation or pleural fluid. 2. Some posterior pleural thickening in the left lower hemithorax with fat deposition in the pleural space. 3. Coronary atherosclerosis with calcified plaque in the distribution of the coronary arteries. 4. Evidence of at least early cirrhosis of the liver. 5. Cholelithiasis.  Cath 07/31/2015: LM lesion,  15% stenosed. Mid LAD to Dist LAD lesion, 20% stenosed. The lesion was previously treated with a stent (unknown type)greater than two years ago. Mid LAD lesion, 100% stenosed. Prox RCA lesion, 30% stenosed. Dist RCA  lesion, 40% stenosed. Post Atrio lesion, 50% stenosed. Mid RCA lesion, 20% stenosed. The lesion was previously treated with a stent (unknown type). The left ventricular systolic function is normal.   1. Significant underlying three-vessel coronary artery disease with patent RCA stent. Occluded mid LAD proximal to a previously placed stent with right-to-left collaterals. The LAD appears to be diffusely diseased in multiple areas. 2. Low normal LV systolic function with an ejection fraction of 50-55%. 3. Mildly elevated left ventricular end-diastolic pressure (EDP between 17-20 mmHg)  Recommendations: The LAD appears to be chronically occluded with TIMI 1 flow and reasonable right-to-left collaterals. The LAD itself appears to be diffusely disease in the midsegment and might not be optimal for PCI. I recommend attempting optimizing medical therapy and increasing antianginal medications before considering LAD PCI. )       Anesthesia Quick Evaluation

## 2021-01-13 NOTE — Progress Notes (Signed)
Anesthesia Chart Review: Same day workup  Follows with cardiology for hx of coronary artery disease status post MI with subsequent BMS x2 to RCA 2007 and DES to LAD 2008, abdominal aortic aneurysm status post endovascular repair in 2019, chronic diastolic heart failure, hypertension, hyperlipidemia. In October 2016, he underwent diagnostic catheterization revealing an occluded mid LAD proximal to the previously placed stent with diffuse disease and right to left collaterals. RCA stents were patent. EF 50 to 55%. Medical therapy was recommended. Last seen by Dr. Kirke Corin 12/19/20 for preop clearance. Per note, "Preop cardiovascular evaluation for eye surgery: Patient with no anginal symptoms and good functional capacity.  No need for further cardiac evaluation before surgery.  Aspirin can be held 5 days as requested."  Pt was also seen by PCP Dr. Marcelino Duster 12/18/20 for preop eval. Per note, he was medically optimized but would require cardiology input (whichw as received from Dr. Kirke Corin.)  Hx of etoh abuse, reportedly drinks 36 standard drinks per week.   Hx of COPD with 80 pack year smoking history.  He will need DOS labs and evaluation.   CT Chest 11/08/19: IMPRESSION: 1. Advanced emphysematous lung disease. No evidence of pulmonary edema, consolidation or pleural fluid. 2. Some posterior pleural thickening in the left lower hemithorax with fat deposition in the pleural space. 3. Coronary atherosclerosis with calcified plaque in the distribution of the coronary arteries. 4. Evidence of at least early cirrhosis of the liver. 5. Cholelithiasis.  Cath 07/31/2015:  LM lesion, 15% stenosed.  Mid LAD to Dist LAD lesion, 20% stenosed. The lesion was previously treated with a stent (unknown type)greater than two years ago.  Mid LAD lesion, 100% stenosed.  Prox RCA lesion, 30% stenosed.  Dist RCA lesion, 40% stenosed.  Post Atrio lesion, 50% stenosed.  Mid RCA lesion, 20% stenosed. The  lesion was previously treated with a stent (unknown type).  The left ventricular systolic function is normal.   1. Significant underlying three-vessel coronary artery disease with patent RCA stent. Occluded mid LAD proximal to a previously placed stent with right-to-left collaterals. The LAD appears to be diffusely diseased in multiple areas. 2. Low normal LV systolic function with an ejection fraction of 50-55%. 3. Mildly elevated left ventricular end-diastolic pressure (EDP between 17-20 mmHg)  Recommendations: The LAD appears to be chronically occluded with TIMI 1 flow and reasonable right-to-left collaterals. The LAD itself appears to be diffusely disease in the midsegment and might not be optimal for PCI. I recommend attempting optimizing medical therapy and increasing antianginal medications before considering LAD PCI.     Zannie Cove [facility] Short Stay Center/Anesthesiology Phone [phone] 01/13/2021 12:35 PM

## 2021-01-14 ENCOUNTER — Encounter (HOSPITAL_COMMUNITY): Payer: Self-pay | Admitting: Ophthalmology

## 2021-01-14 ENCOUNTER — Other Ambulatory Visit: Payer: Self-pay

## 2021-01-14 ENCOUNTER — Ambulatory Visit (HOSPITAL_COMMUNITY): Payer: Medicare Other | Admitting: Physician Assistant

## 2021-01-14 ENCOUNTER — Encounter (HOSPITAL_COMMUNITY)
Admission: RE | Disposition: A | Discharge status: Home / Self Care | Payer: Self-pay | Source: Home / Self Care | Attending: Ophthalmology

## 2021-01-14 ENCOUNTER — Ambulatory Visit (HOSPITAL_COMMUNITY)
Admission: RE | Admit: 2021-01-14 | Discharge: 2021-01-14 | Disposition: A | Discharge status: Home / Self Care | Payer: Medicare Other | Attending: Ophthalmology | Admitting: Ophthalmology

## 2021-01-14 ENCOUNTER — Encounter (INDEPENDENT_AMBULATORY_CARE_PROVIDER_SITE_OTHER): Payer: Medicare Other | Admitting: Ophthalmology

## 2021-01-14 DIAGNOSIS — I110 Hypertensive heart disease with heart failure: Secondary | ICD-10-CM | POA: Insufficient documentation

## 2021-01-14 DIAGNOSIS — H35341 Macular cyst, hole, or pseudohole, right eye: Secondary | ICD-10-CM | POA: Insufficient documentation

## 2021-01-14 DIAGNOSIS — I252 Old myocardial infarction: Secondary | ICD-10-CM | POA: Diagnosis not present

## 2021-01-14 DIAGNOSIS — H43813 Vitreous degeneration, bilateral: Secondary | ICD-10-CM

## 2021-01-14 DIAGNOSIS — Z8679 Personal history of other diseases of the circulatory system: Secondary | ICD-10-CM | POA: Diagnosis not present

## 2021-01-14 DIAGNOSIS — F17210 Nicotine dependence, cigarettes, uncomplicated: Secondary | ICD-10-CM | POA: Diagnosis not present

## 2021-01-14 DIAGNOSIS — I5032 Chronic diastolic (congestive) heart failure: Secondary | ICD-10-CM | POA: Insufficient documentation

## 2021-01-14 DIAGNOSIS — H353132 Nonexudative age-related macular degeneration, bilateral, intermediate dry stage: Secondary | ICD-10-CM

## 2021-01-14 DIAGNOSIS — J449 Chronic obstructive pulmonary disease, unspecified: Secondary | ICD-10-CM | POA: Insufficient documentation

## 2021-01-14 DIAGNOSIS — E785 Hyperlipidemia, unspecified: Secondary | ICD-10-CM | POA: Diagnosis not present

## 2021-01-14 DIAGNOSIS — Z955 Presence of coronary angioplasty implant and graft: Secondary | ICD-10-CM | POA: Diagnosis not present

## 2021-01-14 DIAGNOSIS — H35033 Hypertensive retinopathy, bilateral: Secondary | ICD-10-CM | POA: Diagnosis not present

## 2021-01-14 DIAGNOSIS — Z8249 Family history of ischemic heart disease and other diseases of the circulatory system: Secondary | ICD-10-CM | POA: Diagnosis not present

## 2021-01-14 DIAGNOSIS — Z87442 Personal history of urinary calculi: Secondary | ICD-10-CM | POA: Insufficient documentation

## 2021-01-14 DIAGNOSIS — K8020 Calculus of gallbladder without cholecystitis without obstruction: Secondary | ICD-10-CM | POA: Diagnosis not present

## 2021-01-14 DIAGNOSIS — K7460 Unspecified cirrhosis of liver: Secondary | ICD-10-CM | POA: Insufficient documentation

## 2021-01-14 DIAGNOSIS — I2510 Atherosclerotic heart disease of native coronary artery without angina pectoris: Secondary | ICD-10-CM | POA: Diagnosis not present

## 2021-01-14 DIAGNOSIS — I10 Essential (primary) hypertension: Secondary | ICD-10-CM | POA: Diagnosis not present

## 2021-01-14 HISTORY — PX: 25 GAUGE PARS PLANA VITRECTOMY WITH 20 GAUGE MVR PORT FOR MACULAR HOLE: SHX6096

## 2021-01-14 HISTORY — PX: GAS/FLUID EXCHANGE: SHX5334

## 2021-01-14 HISTORY — PX: PHOTOCOAGULATION WITH LASER: SHX6027

## 2021-01-14 HISTORY — PX: SERUM PATCH: SHX6091

## 2021-01-14 HISTORY — PX: MEMBRANE PEEL: SHX5967

## 2021-01-14 HISTORY — PX: GAS INSERTION: SHX5336

## 2021-01-14 HISTORY — PX: AIR/FLUID EXCHANGE: SHX6494

## 2021-01-14 LAB — COMPREHENSIVE METABOLIC PANEL
ALT: 22 U/L (ref 0–44)
AST: 22 U/L (ref 15–41)
Albumin: 3.8 g/dL (ref 3.5–5.0)
Alkaline Phosphatase: 63 U/L (ref 38–126)
Anion gap: 7 (ref 5–15)
BUN: 6 mg/dL — ABNORMAL LOW (ref 8–23)
CO2: 26 mmol/L (ref 22–32)
Calcium: 8.8 mg/dL — ABNORMAL LOW (ref 8.9–10.3)
Chloride: 101 mmol/L (ref 98–111)
Creatinine, Ser: 0.88 mg/dL (ref 0.61–1.24)
GFR, Estimated: >60 mL/min (ref >60)
Glucose, Bld: 105 mg/dL — ABNORMAL HIGH (ref 70–99)
Potassium: 4 mmol/L (ref 3.5–5.1)
Sodium: 134 mmol/L — ABNORMAL LOW (ref 135–145)
Total Bilirubin: 1.2 mg/dL (ref 0.3–1.2)
Total Protein: 6.2 g/dL — ABNORMAL LOW (ref 6.5–8.1)

## 2021-01-14 LAB — CBC
HCT: 54.1 % — ABNORMAL HIGH (ref 39.0–52.0)
Hemoglobin: 18.3 g/dL — ABNORMAL HIGH (ref 13.0–17.0)
MCH: 32.7 pg (ref 26.0–34.0)
MCHC: 33.8 g/dL (ref 30.0–36.0)
MCV: 96.6 fL (ref 80.0–100.0)
Platelets: 166 10*3/uL (ref 150–400)
RBC: 5.6 MIL/uL (ref 4.22–5.81)
RDW: 13.1 % (ref 11.5–15.5)
WBC: 5.7 10*3/uL (ref 4.0–10.5)
nRBC: 0 % (ref 0.0–0.2)

## 2021-01-14 LAB — AUTOLOGOUS SERUM PATCH PREP

## 2021-01-14 SURGERY — 25 GAUGE PARS PLANA VITRECTOMY WITH 20 GAUGE MVR PORT FOR MACULAR HOLE
Anesthesia: General | Site: Eye | Laterality: Right

## 2021-01-14 MED ORDER — ROCURONIUM BROMIDE 10 MG/ML (PF) SYRINGE
PREFILLED_SYRINGE | INTRAVENOUS | Status: AC
  Filled 2021-01-14: qty 10

## 2021-01-14 MED ORDER — ACETAMINOPHEN 325 MG PO TABS: 325.0000 mg | ORAL_TABLET | ORAL | Status: DC | PRN

## 2021-01-14 MED ORDER — ENALAPRIL MALEATE 20 MG PO TABS: 20.0000 mg | ORAL_TABLET | Freq: Two times a day (BID) | ORAL | Status: DC

## 2021-01-14 MED ORDER — SODIUM HYALURONATE 10 MG/ML IO SOLN
INTRAOCULAR | Status: DC | PRN
  Administered 2021-01-14: 0.85 mL via INTRAOCULAR

## 2021-01-14 MED ORDER — PROPOFOL 10 MG/ML IV BOLUS
INTRAVENOUS | Status: DC | PRN
  Administered 2021-01-14: 130 mg via INTRAVENOUS

## 2021-01-14 MED ORDER — STERILE WATER FOR INJECTION IJ SOLN
INTRAMUSCULAR | Status: DC | PRN
  Administered 2021-01-14: 20 mL

## 2021-01-14 MED ORDER — LIDOCAINE HCL 2 % IJ SOLN
INTRAMUSCULAR | Status: AC
  Filled 2021-01-14: qty 20

## 2021-01-14 MED ORDER — BSS IO SOLN
INTRAOCULAR | Status: AC
  Filled 2021-01-14: qty 15

## 2021-01-14 MED ORDER — MORPHINE SULFATE (PF) 2 MG/ML IV SOLN: 1.0000 mg | INTRAVENOUS | Status: DC | PRN

## 2021-01-14 MED ORDER — ORAL CARE MOUTH RINSE: 15.0000 mL | Freq: Once | OROMUCOSAL | Status: AC

## 2021-01-14 MED ORDER — TEMAZEPAM 15 MG PO CAPS: 15.0000 mg | ORAL_CAPSULE | Freq: Every evening | ORAL | Status: DC | PRN

## 2021-01-14 MED ORDER — CEFTAZIDIME 1 G IJ SOLR
INTRAMUSCULAR | Status: AC
  Filled 2021-01-14: qty 1

## 2021-01-14 MED ORDER — FENTANYL CITRATE (PF) 250 MCG/5ML IJ SOLN
INTRAMUSCULAR | Status: AC
  Filled 2021-01-14: qty 5

## 2021-01-14 MED ORDER — SODIUM HYALURONATE 10 MG/ML IO SOLN
INTRAOCULAR | Status: AC
  Filled 2021-01-14: qty 0.85

## 2021-01-14 MED ORDER — ONDANSETRON HCL 4 MG/2ML IJ SOLN
INTRAMUSCULAR | Status: DC | PRN
  Administered 2021-01-14: 4 mg via INTRAVENOUS

## 2021-01-14 MED ORDER — BACITRACIN-POLYMYXIN B 500-10000 UNIT/GM OP OINT: 1.0000 "application " | TOPICAL_OINTMENT | Freq: Three times a day (TID) | OPHTHALMIC | Status: DC

## 2021-01-14 MED ORDER — FENTANYL CITRATE (PF) 100 MCG/2ML IJ SOLN
25.0000 ug | INTRAMUSCULAR | Status: DC | PRN
  Administered 2021-01-14: 25 ug via INTRAVENOUS

## 2021-01-14 MED ORDER — LIDOCAINE 2% (20 MG/ML) 5 ML SYRINGE
INTRAMUSCULAR | Status: DC | PRN
  Administered 2021-01-14: 80 mg via INTRAVENOUS

## 2021-01-14 MED ORDER — BSS PLUS IO SOLN
INTRAOCULAR | Status: AC
  Filled 2021-01-14: qty 500

## 2021-01-14 MED ORDER — ONDANSETRON HCL 4 MG/2ML IJ SOLN
INTRAMUSCULAR | Status: AC
  Filled 2021-01-14: qty 2

## 2021-01-14 MED ORDER — HYDROCODONE-ACETAMINOPHEN 5-325 MG PO TABS: 1.0000 | ORAL_TABLET | ORAL | Status: DC | PRN

## 2021-01-14 MED ORDER — HEMOSTATIC AGENTS (NO CHARGE) OPTIME
TOPICAL | Status: DC | PRN
  Administered 2021-01-14: 1 via TOPICAL

## 2021-01-14 MED ORDER — ESMOLOL HCL 100 MG/10ML IV SOLN
INTRAVENOUS | Status: AC
  Filled 2021-01-14: qty 10

## 2021-01-14 MED ORDER — DEXAMETHASONE SODIUM PHOSPHATE 10 MG/ML IJ SOLN
INTRAMUSCULAR | Status: AC
  Filled 2021-01-14: qty 1

## 2021-01-14 MED ORDER — DEXAMETHASONE SODIUM PHOSPHATE 10 MG/ML IJ SOLN
INTRAMUSCULAR | Status: DC | PRN
  Administered 2021-01-14: 10 mg

## 2021-01-14 MED ORDER — BUPIVACAINE HCL (PF) 0.75 % IJ SOLN
INTRAMUSCULAR | Status: DC | PRN
  Administered 2021-01-14: 10 mL

## 2021-01-14 MED ORDER — CHLORHEXIDINE GLUCONATE 0.12 % MT SOLN
15.0000 mL | Freq: Once | OROMUCOSAL | Status: AC
  Administered 2021-01-14: 15 mL via OROMUCOSAL
  Filled 2021-01-14: qty 15

## 2021-01-14 MED ORDER — EPINEPHRINE PF 1 MG/ML IJ SOLN
INTRAMUSCULAR | Status: AC
  Filled 2021-01-14: qty 1

## 2021-01-14 MED ORDER — NITROGLYCERIN 0.4 MG SL SUBL: 0.4000 mg | SUBLINGUAL_TABLET | SUBLINGUAL | Status: DC | PRN

## 2021-01-14 MED ORDER — ACETAZOLAMIDE SODIUM 500 MG IJ SOLR
INTRAMUSCULAR | Status: AC
  Filled 2021-01-14: qty 500

## 2021-01-14 MED ORDER — SODIUM CHLORIDE 0.9 % IV SOLN: INTRAVENOUS | Status: DC

## 2021-01-14 MED ORDER — MIDAZOLAM HCL 2 MG/2ML IJ SOLN
INTRAMUSCULAR | Status: AC
  Filled 2021-01-14: qty 2

## 2021-01-14 MED ORDER — TRIAMCINOLONE ACETONIDE 40 MG/ML IJ SUSP
INTRAMUSCULAR | Status: AC
  Filled 2021-01-14: qty 5

## 2021-01-14 MED ORDER — BRIMONIDINE TARTRATE 0.2 % OP SOLN: 1.0000 [drp] | Freq: Two times a day (BID) | OPHTHALMIC | Status: DC

## 2021-01-14 MED ORDER — DORZOLAMIDE HCL 2 % OP SOLN: 1.0000 [drp] | Freq: Three times a day (TID) | OPHTHALMIC | Status: DC

## 2021-01-14 MED ORDER — CEFAZOLIN SODIUM-DEXTROSE 2-4 GM/100ML-% IV SOLN
2.0000 g | INTRAVENOUS | Status: AC
  Administered 2021-01-14: 2 g via INTRAVENOUS
  Filled 2021-01-14: qty 100

## 2021-01-14 MED ORDER — GATIFLOXACIN 0.5 % OP SOLN: 1.0000 [drp] | Freq: Four times a day (QID) | OPHTHALMIC | Status: DC

## 2021-01-14 MED ORDER — HYPROMELLOSE (GONIOSCOPIC) 2.5 % OP SOLN
OPHTHALMIC | Status: AC
  Filled 2021-01-14: qty 15

## 2021-01-14 MED ORDER — ONDANSETRON HCL 4 MG/2ML IJ SOLN: 4.0000 mg | Freq: Once | INTRAMUSCULAR | Status: DC | PRN

## 2021-01-14 MED ORDER — SUGAMMADEX SODIUM 200 MG/2ML IV SOLN
INTRAVENOUS | Status: DC | PRN
  Administered 2021-01-14 (×2): 200 mg via INTRAVENOUS

## 2021-01-14 MED ORDER — DORZOLAMIDE HCL-TIMOLOL MAL 2-0.5 % OP SOLN
1.0000 [drp] | Freq: Once | OPHTHALMIC | Status: AC
  Administered 2021-01-14: 1 [drp] via OPHTHALMIC
  Filled 2021-01-14: qty 10

## 2021-01-14 MED ORDER — BACITRACIN-POLYMYXIN B 500-10000 UNIT/GM OP OINT
TOPICAL_OINTMENT | OPHTHALMIC | Status: DC | PRN
  Administered 2021-01-14: 1

## 2021-01-14 MED ORDER — ATORVASTATIN CALCIUM 40 MG PO TABS: 40.0000 mg | ORAL_TABLET | Freq: Every day | ORAL | Status: DC

## 2021-01-14 MED ORDER — GATIFLOXACIN 0.5 % OP SOLN
1.0000 [drp] | OPHTHALMIC | Status: AC | PRN
  Administered 2021-01-14 (×3): 1 [drp] via OPHTHALMIC
  Filled 2021-01-14: qty 2.5

## 2021-01-14 MED ORDER — HYALURONIDASE HUMAN 150 UNIT/ML IJ SOLN
INTRAMUSCULAR | Status: AC
  Filled 2021-01-14: qty 1

## 2021-01-14 MED ORDER — LATANOPROST 0.005 % OP SOLN: 1.0000 [drp] | Freq: Every day | OPHTHALMIC | Status: DC

## 2021-01-14 MED ORDER — BUPIVACAINE-EPINEPHRINE (PF) 0.25% -1:200000 IJ SOLN
INTRAMUSCULAR | Status: AC
  Filled 2021-01-14: qty 30

## 2021-01-14 MED ORDER — FENTANYL CITRATE (PF) 100 MCG/2ML IJ SOLN
INTRAMUSCULAR | Status: DC | PRN
  Administered 2021-01-14: 50 ug via INTRAVENOUS
  Administered 2021-01-14: 100 ug via INTRAVENOUS

## 2021-01-14 MED ORDER — SODIUM CHLORIDE (PF) 0.9 % IJ SOLN
INTRAMUSCULAR | Status: AC
  Filled 2021-01-14: qty 10

## 2021-01-14 MED ORDER — PHENYLEPHRINE HCL 2.5 % OP SOLN
1.0000 [drp] | OPHTHALMIC | Status: AC | PRN
  Administered 2021-01-14 (×3): 1 [drp] via OPHTHALMIC
  Filled 2021-01-14: qty 2

## 2021-01-14 MED ORDER — EPINEPHRINE 1 MG/10ML IJ SOSY
PREFILLED_SYRINGE | INTRAMUSCULAR | Status: AC
  Filled 2021-01-14: qty 10

## 2021-01-14 MED ORDER — SODIUM CHLORIDE 0.45 % IV SOLN: INTRAVENOUS | Status: DC

## 2021-01-14 MED ORDER — PROPOFOL 10 MG/ML IV BOLUS
INTRAVENOUS | Status: AC
  Filled 2021-01-14: qty 20

## 2021-01-14 MED ORDER — BACITRACIN-POLYMYXIN B 500-10000 UNIT/GM OP OINT
TOPICAL_OINTMENT | OPHTHALMIC | Status: AC
  Filled 2021-01-14: qty 3.5

## 2021-01-14 MED ORDER — DEXAMETHASONE SODIUM PHOSPHATE 10 MG/ML IJ SOLN
INTRAMUSCULAR | Status: DC | PRN
  Administered 2021-01-14: 4 mg via INTRAVENOUS

## 2021-01-14 MED ORDER — FENTANYL CITRATE (PF) 100 MCG/2ML IJ SOLN
INTRAMUSCULAR | Status: AC
  Filled 2021-01-14: qty 2

## 2021-01-14 MED ORDER — CYCLOPENTOLATE HCL 1 % OP SOLN
1.0000 [drp] | OPHTHALMIC | Status: AC | PRN
  Administered 2021-01-14 (×3): 1 [drp] via OPHTHALMIC
  Filled 2021-01-14: qty 2

## 2021-01-14 MED ORDER — MAGNESIUM HYDROXIDE 400 MG/5ML PO SUSP: 15.0000 mL | Freq: Four times a day (QID) | ORAL | Status: DC | PRN

## 2021-01-14 MED ORDER — PREDNISOLONE ACETATE 1 % OP SUSP: 1.0000 [drp] | Freq: Four times a day (QID) | OPHTHALMIC | Status: DC

## 2021-01-14 MED ORDER — STERILE WATER FOR INJECTION IJ SOLN
INTRAMUSCULAR | Status: AC
  Filled 2021-01-14: qty 20

## 2021-01-14 MED ORDER — EPINEPHRINE PF 1 MG/ML IJ SOLN
INTRAOCULAR | Status: DC | PRN
  Administered 2021-01-14: 500 mL

## 2021-01-14 MED ORDER — TORSEMIDE 20 MG PO TABS: 20.0000 mg | ORAL_TABLET | Freq: Every day | ORAL | Status: DC | PRN

## 2021-01-14 MED ORDER — TETRACAINE HCL 0.5 % OP SOLN: 2.0000 [drp] | Freq: Once | OPHTHALMIC | Status: DC

## 2021-01-14 MED ORDER — ACETAZOLAMIDE SODIUM 500 MG IJ SOLR: 500.0000 mg | Freq: Once | INTRAMUSCULAR | Status: DC

## 2021-01-14 MED ORDER — LACTATED RINGERS IV SOLN: INTRAVENOUS | Status: DC

## 2021-01-14 MED ORDER — ROCURONIUM BROMIDE 10 MG/ML (PF) SYRINGE
PREFILLED_SYRINGE | INTRAVENOUS | Status: DC | PRN
  Administered 2021-01-14: 10 mg via INTRAVENOUS
  Administered 2021-01-14: 20 mg via INTRAVENOUS
  Administered 2021-01-14: 50 mg via INTRAVENOUS
  Administered 2021-01-14: 20 mg via INTRAVENOUS

## 2021-01-14 MED ORDER — ALPRAZOLAM 0.5 MG PO TABS: 0.5000 mg | ORAL_TABLET | Freq: Four times a day (QID) | ORAL | Status: DC | PRN

## 2021-01-14 MED ORDER — POLYMYXIN B SULFATE 500000 UNITS IJ SOLR
INTRAMUSCULAR | Status: AC
  Filled 2021-01-14: qty 500000

## 2021-01-14 MED ORDER — MIDAZOLAM HCL 5 MG/5ML IJ SOLN
INTRAMUSCULAR | Status: DC | PRN
  Administered 2021-01-14: 2 mg via INTRAVENOUS

## 2021-01-14 MED ORDER — ATROPINE SULFATE 1 % OP SOLN
OPHTHALMIC | Status: DC | PRN
  Administered 2021-01-14: 1 [drp] via OPHTHALMIC

## 2021-01-14 MED ORDER — TROPICAMIDE 1 % OP SOLN
1.0000 [drp] | OPHTHALMIC | Status: AC | PRN
  Administered 2021-01-14 (×3): 1 [drp] via OPHTHALMIC
  Filled 2021-01-14: qty 15

## 2021-01-14 MED ORDER — BUPIVACAINE HCL (PF) 0.75 % IJ SOLN
INTRAMUSCULAR | Status: AC
  Filled 2021-01-14: qty 10

## 2021-01-14 MED ORDER — LIDOCAINE 2% (20 MG/ML) 5 ML SYRINGE
INTRAMUSCULAR | Status: AC
  Filled 2021-01-14: qty 5

## 2021-01-14 MED ORDER — ONDANSETRON HCL 4 MG/2ML IJ SOLN: 4.0000 mg | Freq: Four times a day (QID) | INTRAMUSCULAR | Status: DC | PRN

## 2021-01-14 MED ORDER — ATROPINE SULFATE 1 % OP SOLN
OPHTHALMIC | Status: AC
  Filled 2021-01-14: qty 5

## 2021-01-14 SURGICAL SUPPLY — 73 items
BALL CTTN LRG ABS STRL LF (GAUZE/BANDAGES/DRESSINGS) ×3
BAND WRIST GAS GREEN (MISCELLANEOUS) IMPLANT
BLADE EYE CATARACT 19 1.4 BEAV (BLADE) IMPLANT
BLADE MVR KNIFE 19G (BLADE) IMPLANT
BLADE MVR KNIFE 20G (BLADE) ×3 IMPLANT
CABLE BIPOLOR RESECTION CORD (MISCELLANEOUS) ×2 IMPLANT
CANNULA VLV SOFT TIP 25G (OPHTHALMIC) ×1 IMPLANT
CANNULA VLV SOFT TIP 25GA (OPHTHALMIC) ×6 IMPLANT
CORD BIPOLAR FORCEPS 12FT (ELECTRODE) ×3 IMPLANT
COTTONBALL LRG STERILE PKG (GAUZE/BANDAGES/DRESSINGS) ×9 IMPLANT
COVER MAYO STAND STRL (DRAPES) IMPLANT
COVER WAND RF STERILE (DRAPES) ×3 IMPLANT
DRAPE INCISE 51X51 W/FILM STRL (DRAPES) IMPLANT
DRAPE OPHTHALMIC 77X100 STRL (CUSTOM PROCEDURE TRAY) ×3 IMPLANT
ERASER HMR WETFIELD 23G BP (MISCELLANEOUS) ×3 IMPLANT
FILTER BLUE MILLIPORE (MISCELLANEOUS) ×2 IMPLANT
FILTER STRAW FLUID ASPIR (MISCELLANEOUS) ×3 IMPLANT
FORCEPS GRIESHABER ILM 25G A (INSTRUMENTS) ×2 IMPLANT
GAS AUTO FILL CONSTEL (OPHTHALMIC) ×6
GAS AUTO FILL CONSTELLATION (OPHTHALMIC) ×1 IMPLANT
GAS IO PFP 125GM ISPAN CNSTL (MISCELLANEOUS) IMPLANT
GAS TANK CONSTELL (MISCELLANEOUS) ×3
GAS WRIST BAND GREEN (MISCELLANEOUS) ×3
GLOVE SS BIOGEL STRL SZ 6.5 (GLOVE) ×1 IMPLANT
GLOVE SS BIOGEL STRL SZ 7 (GLOVE) ×1 IMPLANT
GLOVE SUPERSENSE BIOGEL SZ 6.5 (GLOVE) ×2
GLOVE SUPERSENSE BIOGEL SZ 7 (GLOVE) ×2
GLOVE SURG 8.5 LATEX PF (GLOVE) ×3 IMPLANT
GLOVE TRIUMPH SURG SIZE 8.5 (KITS) ×3 IMPLANT
GOWN STRL REUS W/ TWL LRG LVL3 (GOWN DISPOSABLE) ×3 IMPLANT
GOWN STRL REUS W/TWL LRG LVL3 (GOWN DISPOSABLE) ×9
HANDLE PNEUMATIC FOR CONSTEL (OPHTHALMIC) ×2 IMPLANT
KIT BASIN OR (CUSTOM PROCEDURE TRAY) ×3 IMPLANT
KNIFE GRIESHABER SHARP 2.5MM (MISCELLANEOUS) IMPLANT
MICROPICK 25G (MISCELLANEOUS)
NDL 18GX1X1/2 (RX/OR ONLY) (NEEDLE) ×1 IMPLANT
NDL 25GX 5/8IN NON SAFETY (NEEDLE) ×1 IMPLANT
NDL FILTER BLUNT 18X1 1/2 (NEEDLE) IMPLANT
NDL HYPO 30X.5 LL (NEEDLE) IMPLANT
NEEDLE 18GX1X1/2 (RX/OR ONLY) (NEEDLE) ×3 IMPLANT
NEEDLE 25GX 5/8IN NON SAFETY (NEEDLE) ×3 IMPLANT
NEEDLE FILTER BLUNT 18X 1/2SAF (NEEDLE) ×2
NEEDLE FILTER BLUNT 18X1 1/2 (NEEDLE) ×1 IMPLANT
NEEDLE HYPO 30X.5 LL (NEEDLE) IMPLANT
NS IRRIG 1000ML POUR BTL (IV SOLUTION) ×3 IMPLANT
PACK FRAGMATOME (OPHTHALMIC) IMPLANT
PACK VITRECTOMY CUSTOM (CUSTOM PROCEDURE TRAY) ×3 IMPLANT
PAD ARMBOARD 7.5X6 YLW CONV (MISCELLANEOUS) ×6 IMPLANT
PAK PIK VITRECTOMY CVS 25GA (OPHTHALMIC) ×3 IMPLANT
PIC ILLUMINATED 25G (OPHTHALMIC) ×3
PICK MICROPICK 25G (MISCELLANEOUS) IMPLANT
PIK ILLUMINATED 25G (OPHTHALMIC) ×1 IMPLANT
PROBE LASER ILLUM FLEX CVD 25G (OPHTHALMIC) ×2 IMPLANT
REPL STRA BRUSH NDL (NEEDLE) ×1 IMPLANT
REPL STRA BRUSH NEEDLE (NEEDLE) ×3 IMPLANT
RESERVOIR BACK FLUSH (MISCELLANEOUS) ×3 IMPLANT
ROLLS DENTAL (MISCELLANEOUS) ×6 IMPLANT
SCRAPER DIAMOND 25GA (OPHTHALMIC RELATED) IMPLANT
SCRAPER DIAMOND DUST MEMBRANE (MISCELLANEOUS) ×3 IMPLANT
SPONGE SURGIFOAM ABS GEL 12-7 (HEMOSTASIS) ×3 IMPLANT
STOPCOCK 4 WAY LG BORE MALE ST (IV SETS) IMPLANT
SUT CHROMIC 7 0 TG140 8 (SUTURE) ×2 IMPLANT
SUT ETHILON 9 0 TG140 8 (SUTURE) ×3 IMPLANT
SUT POLY NON ABSORB 10-0 8 STR (SUTURE) IMPLANT
SUT SILK 4 0 RB 1 (SUTURE) IMPLANT
SYR 10ML LL (SYRINGE) IMPLANT
SYR 20ML LL LF (SYRINGE) ×3 IMPLANT
SYR 5ML LL (SYRINGE) IMPLANT
SYR BULB EAR ULCER 3OZ GRN STR (SYRINGE) ×3 IMPLANT
SYR TB 1ML LUER SLIP (SYRINGE) ×3 IMPLANT
TUBING HIGH PRESS EXTEN 6IN (TUBING) IMPLANT
WATER STERILE IRR 1000ML POUR (IV SOLUTION) ×3 IMPLANT
WIPE INSTRUMENT VISIWIPE 73X73 (MISCELLANEOUS) ×2 IMPLANT

## 2021-01-14 NOTE — Progress Notes (Signed)
Green Social worker place on patients right hand to notify of gas bubble in right eye

## 2021-01-14 NOTE — H&P (Signed)
I examined the patient today and there is no change in the medical status 

## 2021-01-14 NOTE — Op Note (Signed)
NAME: Bobby Sandoval, Bobby Sandoval. MEDICAL RECORD NO: [id number] ACCOUNT NO: [id number] DATE OF BIRTH: [birth year] FACILITY: [facility] LOCATION: [facility] PHYSICIAN: Beulah Gandy. Ashley Royalty, MD  Operative Report   DATE OF PROCEDURE: 01/14/2021  ADMISSION DIAGNOSIS:  Macular hole, right eye.  PROCEDURES:  Pars plana vitrectomy, retinal photocoagulation, membrane peel, serum patch, gas fluid exchange, all in the right eye.  SURGEON:  Beulah Gandy. Ashley Royalty, MD  ASSISTANT:  Rosalie Doctor, SA  ANESTHESIA:  General.  DESCRIPTION OF PROCEDURE:  Usual prep and drape.  The indirect ophthalmoscope laser was moved into place.  917 burns were placed around the retinal periphery with a power between 300 and 360 milliwatts, 1000 microns each, and 0.1 seconds each.  The  attention was carried to the pars plana area where a conjunctival incision was made at 2 o'clock 4 mm back from the limbus.  The sclera was treated with diathermy and a 3-layered frown incision was made for entry into the vitreous.  25-gauge trocars were  placed at 8 and 10 o'clock with infusion at 8 o'clock.  The MVR incision was made through the 2 o'clock frown incision.  Contact lens ring was anchored into place.  Provisc was placed on the corneal surface and the flat contact lens was placed.  The  lighted pick and the vitreous cutter were used for anterior vitrectomy and core vitrectomy.  All vitreous was removed from the backside of the lens and from around the posterior capsule.  The vitrectomy was carried posteriorly where large strands of  white vitreous were encountered and removed under low suction and rapid cutting.  The vitrectomy was carried into the mid periphery with rotation of the eye and down towards the macular surface.  A large macular hole was seen as well as pigment and  drusen with macular degeneration.  The prismatic 30-degree lens was moved into place.  The vitrectomy was carried into the mid periphery for 360 degrees and then in the far  periphery removing all peripheral vitreous as well.  When this was accomplished,  the magnifying contact lens was placed onto a layer of methyl cellulose and onto the cornea.  The macular hole was treated with diamond dusted membrane scraper.  The internal limiting membrane was removed from the edge to the hole for 360 degrees.  Some  portions of the membrane were pushed together and pushed into the center of the macular hole.  Subretinal pigment and drusen were evident throughout the field of surgery.  Additional internal limiting membrane was removed with the diamond dusted scraper  for 1 disc diameter around the macular hole.  When this was accomplished, additional vitrectomy was carried out to remove these internal limiting membrane fragments.  A total gas-fluid exchange was carried out at this point with placement of the  biconcave contact lens.  Sufficient time was allowed for additional fluid to track down the walls of the eye and collected in the posterior segment.  Serum patch was prepared during this time and C3F8 was prepared to a 14% concentration.  The vitrectomy  instruments were re-entered into the vitreous cavity and posterior fluid was removed with a silicone-tipped suction line.  Serum patch was delivered.  Excess serum was removed with the silicone-tipped suction line.  A total C3F8 and gas exchange was  carried out in the vitreous cavity so that all sterile gas was removed and sterile C3F8 was repositioned in the eye.  The instruments were removed from the eye.  9-0 nylon was used to close  the sclerotomy site at 2 o'clock.  Wet field cautery was used to  close the conjunctival layer.  The closing pressure was 10 with Barraquer tonometer.  Polymyxin and ceftazidime were rinsed around the globe for antibiotic coverage.  Atropine solution was applied.  Marcaine was injected around the globe for  postoperative pain.  Decadron 10 mg was injected into the lower subconjunctival space.   Polysporin ointment, a patch and shield were placed.  The patient was awakened and taken to recovery in satisfactory condition.   ROH D: 01/14/2021 1:02:47 pm T: 01/14/2021 7:34:00 pm  JOB: [id number]/ [id number]

## 2021-01-14 NOTE — Discharge Instructions (Signed)
    Post-operative care        Retina and Vitreous surgery  Care of your eye: . Please do not rub your eye, you may damage the eye. Reyes Ivan your hands and use clean tissues each time you blot the eye . You may take Extra Strength Tylenol if needed for discomfort.  Call for an appointment if the pain becomes severe and is not improved with the medications given on discharge from [facility]. . It is normal for the eye to be red, swollen and bloodshot.  You will have red-tinged tears. .  Activities: *Bed rest or moderate activity is recommended for the first week. *Do not engage in extended reading because this movement can loosen the retina. * Do not strain yourself or lift more than ten pounds.   *You May: Watch television, eat in a restaurant and ride in a car  Bathing:   *Do not get your eye wet for 4 days.  You may bathe without getting the eye wet.  You may wash your hair by lying backward or forward at the sink.  Gas bubble: If you have a gas bubble you may not lie on your back.  You must not fly in an airplane as long as the bubble is in your eye. You will see the bubble as a dark circle in the lower part of your vision.  It moves when you tilt your head.  Maintain the following positions:  ? Face Down  Post Operative Appointment: Please call my office at [phone] for a one-week follow up appointment if one was not given to you before surgery.  Dressing:  Please wear the hard shield only at night for three weeks.  You may include the pad if needed  MEDICATION INSTRUCTIONS Place the following drops in the operative eye:   *Please wait 5 minutes between eye drops if they are scheduled at the same time *Please continue all eye drops in the non-operative eye *Resume all of your other medications as before your surgery

## 2021-01-14 NOTE — Anesthesia Postprocedure Evaluation (Signed)
Anesthesia Post Note  Patient: CLENTON ESPER  Procedure(s) Performed: 25 GAUGE PARS PLANA VITRECTOMY WITH 20 GAUGE MVR PORT FOR MACULAR HOLE (Right Eye) MEMBRANE PEEL (Right Eye) SERUM PATCH (Right Eye) PHOTOCOAGULATION WITH LASER (Right Eye) GAS/FLUID EXCHANGE (Right Eye) INSERTION OF GAS (Right Eye) AIR/FLUID EXCHANGE (Right Eye)     Patient location during evaluation: PACU Anesthesia Type: General Level of consciousness: awake and alert Pain management: pain level controlled Vital Signs Assessment: post-procedure vital signs reviewed and stable Respiratory status: spontaneous breathing, nonlabored ventilation, respiratory function stable and patient connected to nasal cannula oxygen Cardiovascular status: blood pressure returned to baseline and stable Postop Assessment: no apparent nausea or vomiting Anesthetic complications: no   No complications documented.  Last Vitals:  Vitals:   01/14/21 1355 01/14/21 1410  BP: (!) 149/70 (!) 151/57  Pulse: 81 83  Resp: 11 20  Temp:  36.8 C  SpO2: 97% 93%    Last Pain:  Vitals:   01/14/21 1410  TempSrc:   PainSc: 0-No pain                 Cecile Hearing

## 2021-01-14 NOTE — Transfer of Care (Signed)
Immediate Anesthesia Transfer of Care Note  Patient: Bobby Sandoval  Procedure(s) Performed: 25 GAUGE PARS PLANA VITRECTOMY WITH 20 GAUGE MVR PORT FOR MACULAR HOLE (Right Eye) MEMBRANE PEEL (Right Eye) SERUM PATCH (Right Eye) PHOTOCOAGULATION WITH LASER (Right Eye) GAS/FLUID EXCHANGE (Right Eye) INSERTION OF GAS (Right Eye) AIR/FLUID EXCHANGE (Right Eye)  Patient Location: PACU  Anesthesia Type:General  Level of Consciousness: drowsy and patient cooperative  Airway & Oxygen Therapy: Patient Spontanous Breathing and Patient connected to nasal cannula oxygen  Post-op Assessment: Report given to RN and Post -op Vital signs reviewed and stable  Post vital signs: Reviewed and stable  Last Vitals:  Vitals Value Taken Time  BP    Temp    Pulse    Resp    SpO2      Last Pain:  Vitals:   01/14/21 0948  TempSrc:   PainSc: 0-No pain      Patients Stated Pain Goal: 4 (01/14/21 0948)  Complications: No complications documented.

## 2021-01-14 NOTE — Anesthesia Procedure Notes (Signed)
Procedure Name: Intubation Date/Time: 01/14/2021 11:43 AM Performed by: Moshe Salisbury, CRNA Pre-anesthesia Checklist: Patient identified, Emergency Drugs available, Suction available and Patient being monitored Patient Re-evaluated:Patient Re-evaluated prior to induction Oxygen Delivery Method: Circle System Utilized Preoxygenation: Pre-oxygenation with 100% oxygen Induction Type: IV induction Ventilation: Mask ventilation without difficulty Laryngoscope Size: Mac and 4 Grade View: Grade II Tube type: Oral Tube size: 8.0 mm Number of attempts: 1 Airway Equipment and Method: Stylet Placement Confirmation: ETT inserted through vocal cords under direct vision,  positive ETCO2 and breath sounds checked- equal and bilateral Secured at: 22 cm Tube secured with: Tape Dental Injury: Teeth and Oropharynx as per pre-operative assessment

## 2021-01-14 NOTE — Brief Op Note (Signed)
Brief Operative note   Preoperative diagnosis:  macular hole right eye Postoperative diagnosis  same  Procedures: pars plana vitrecotomy, laser, internal limiting membrane peel, serum patch, gas fluid exchange right eye  Surgeon:  Sherrie George, MD...  Assistant:  Rosalie Doctor SA   Anesthesia: General  Specimen: none  Estimated blood loss:  1cc  Complications: none  Patient sent to PACU in good condition  Composed by Sherrie George MD  Dictation number: [id number]

## 2021-01-15 ENCOUNTER — Encounter (HOSPITAL_COMMUNITY): Payer: Self-pay | Admitting: Ophthalmology

## 2021-01-15 ENCOUNTER — Encounter (INDEPENDENT_AMBULATORY_CARE_PROVIDER_SITE_OTHER): Payer: Medicare Other | Admitting: Ophthalmology

## 2021-01-15 DIAGNOSIS — H35341 Macular cyst, hole, or pseudohole, right eye: Secondary | ICD-10-CM

## 2021-01-17 ENCOUNTER — Other Ambulatory Visit: Payer: Self-pay

## 2021-01-17 ENCOUNTER — Encounter (INDEPENDENT_AMBULATORY_CARE_PROVIDER_SITE_OTHER): Payer: Medicare Other | Admitting: Ophthalmology

## 2021-01-17 DIAGNOSIS — H35341 Macular cyst, hole, or pseudohole, right eye: Secondary | ICD-10-CM

## 2021-02-10 ENCOUNTER — Other Ambulatory Visit: Payer: Self-pay

## 2021-02-10 ENCOUNTER — Encounter (INDEPENDENT_AMBULATORY_CARE_PROVIDER_SITE_OTHER): Payer: Medicare Other | Admitting: Ophthalmology

## 2021-02-10 DIAGNOSIS — H35341 Macular cyst, hole, or pseudohole, right eye: Secondary | ICD-10-CM

## 2021-04-21 ENCOUNTER — Other Ambulatory Visit: Payer: Self-pay

## 2021-04-21 ENCOUNTER — Encounter (INDEPENDENT_AMBULATORY_CARE_PROVIDER_SITE_OTHER): Payer: Medicare Other | Admitting: Ophthalmology

## 2021-04-21 DIAGNOSIS — H35341 Macular cyst, hole, or pseudohole, right eye: Secondary | ICD-10-CM

## 2021-04-21 DIAGNOSIS — H353132 Nonexudative age-related macular degeneration, bilateral, intermediate dry stage: Secondary | ICD-10-CM | POA: Diagnosis not present

## 2021-04-21 DIAGNOSIS — H35033 Hypertensive retinopathy, bilateral: Secondary | ICD-10-CM

## 2021-04-21 DIAGNOSIS — I10 Essential (primary) hypertension: Secondary | ICD-10-CM | POA: Diagnosis not present

## 2021-04-21 DIAGNOSIS — H43812 Vitreous degeneration, left eye: Secondary | ICD-10-CM

## 2021-08-22 ENCOUNTER — Encounter (INDEPENDENT_AMBULATORY_CARE_PROVIDER_SITE_OTHER): Payer: Medicare Other | Admitting: Ophthalmology

## 2021-11-19 IMAGING — US US SCROTUM W/ DOPPLER COMPLETE
1 series · 13 of 25 positions shown · non-contrast
Comparison: None relevant. Limited correlation made with
abdominopelvic CTA 03/17/2018

CLINICAL DATA: Right hydrocele for 1 month.

EXAM:
SCROTAL ULTRASOUND
DOPPLER ULTRASOUND OF THE TESTICLES
TECHNIQUE: Complete ultrasound examination of the testicles, epididymis, and
other scrotal structures was performed. Color and spectral Doppler
ultrasound were also utilized to evaluate blood flow to the
testicles.

[Series 1: us scrotum w/ doppler complete · 13 of 118 slices shown]
[im 1/118]
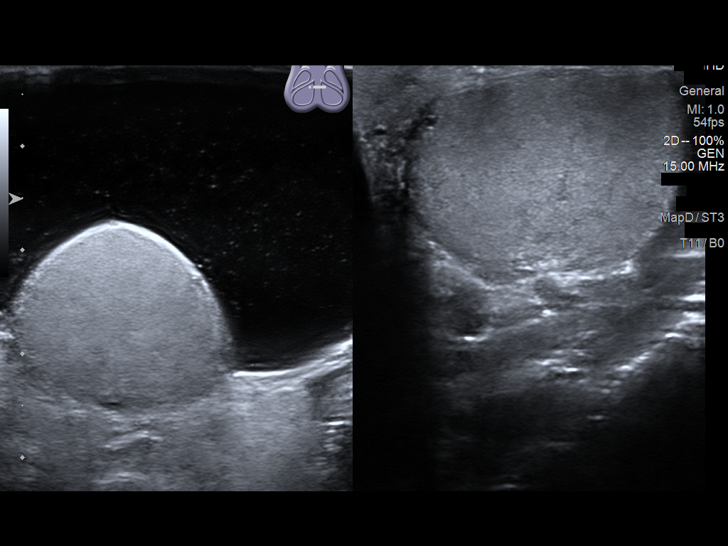
[im 10/118]
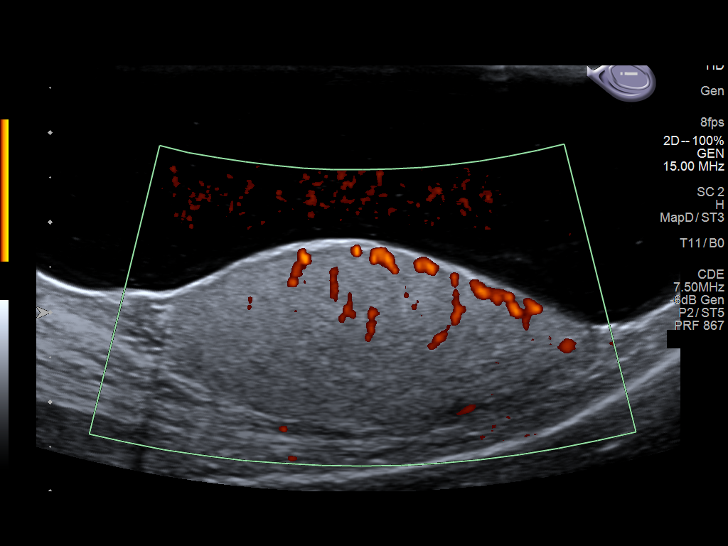
[im 20/118]
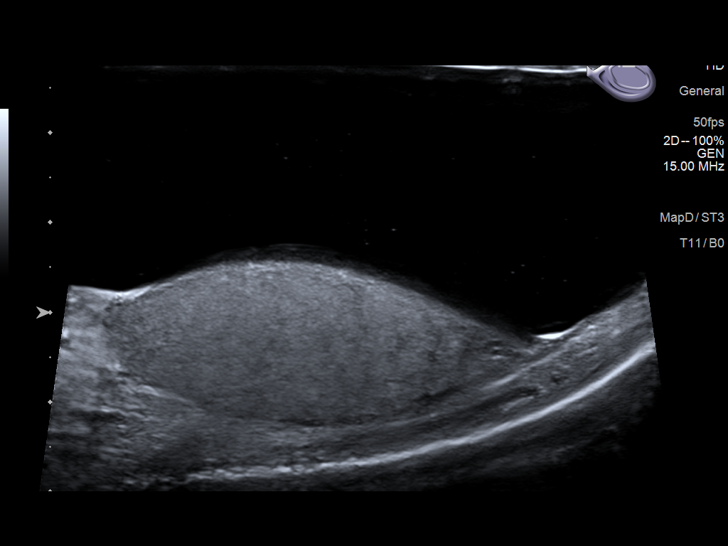
[im 30/118]
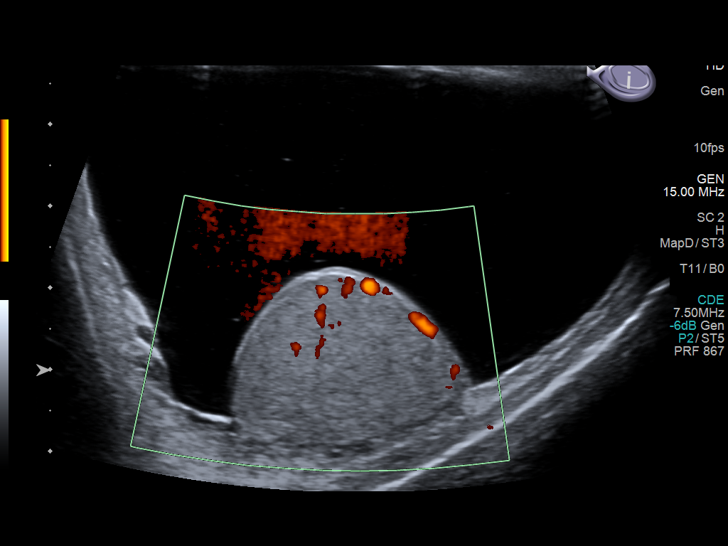
[im 40/118]
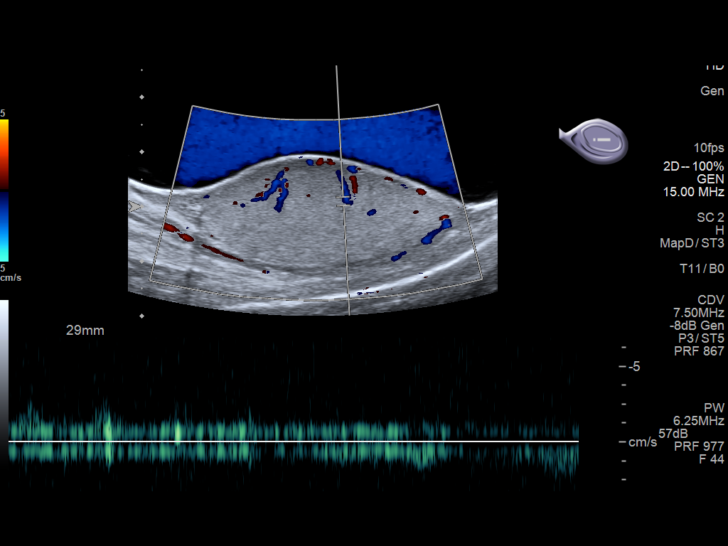
[im 49/118]
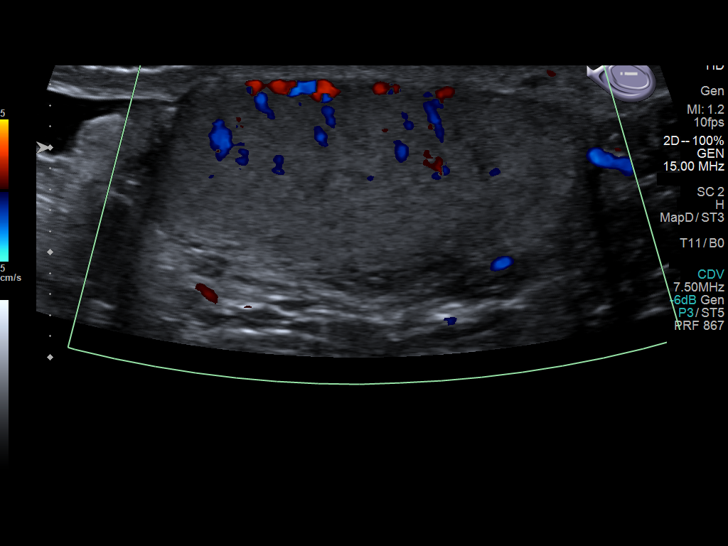
[im 59/118]
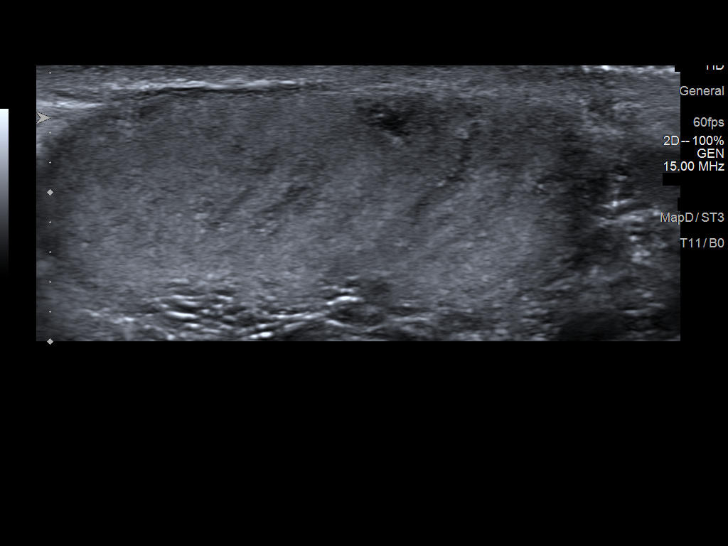
[im 69/118]
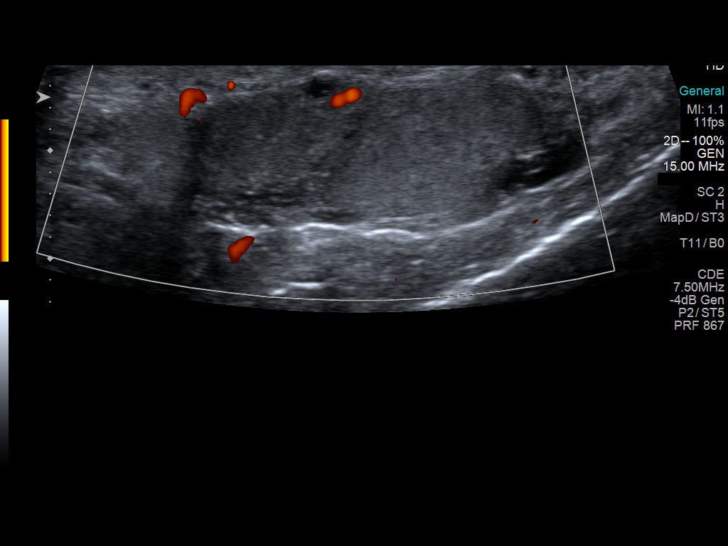
[im 79/118]
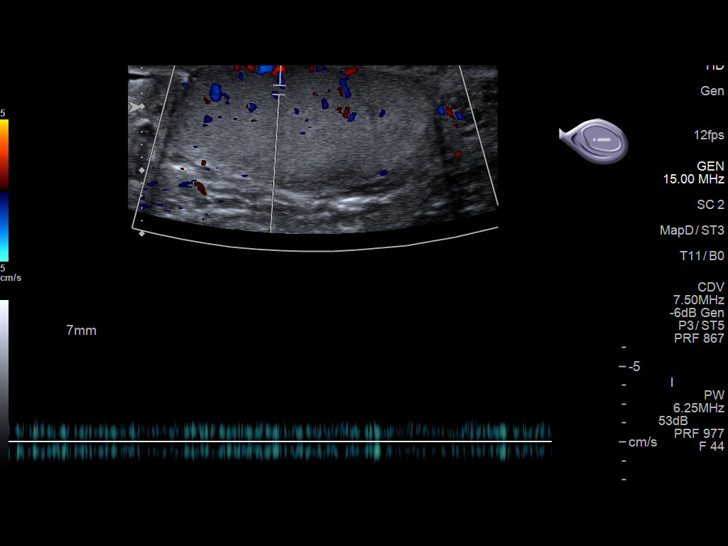
[im 88/118]
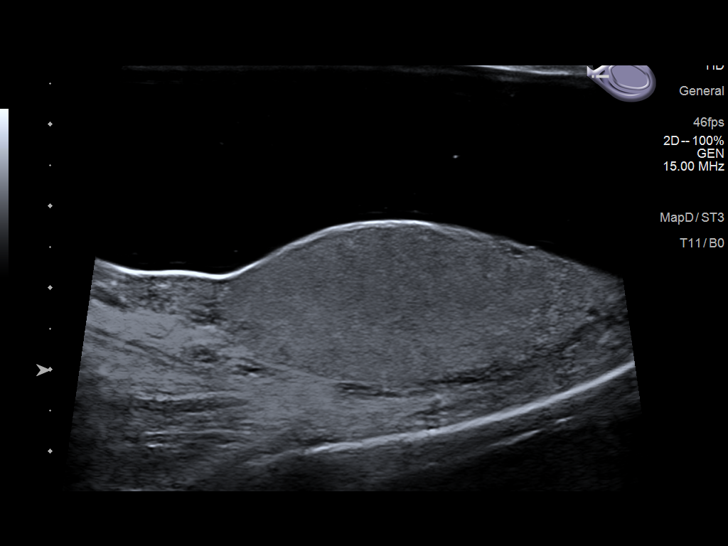
[im 98/118]
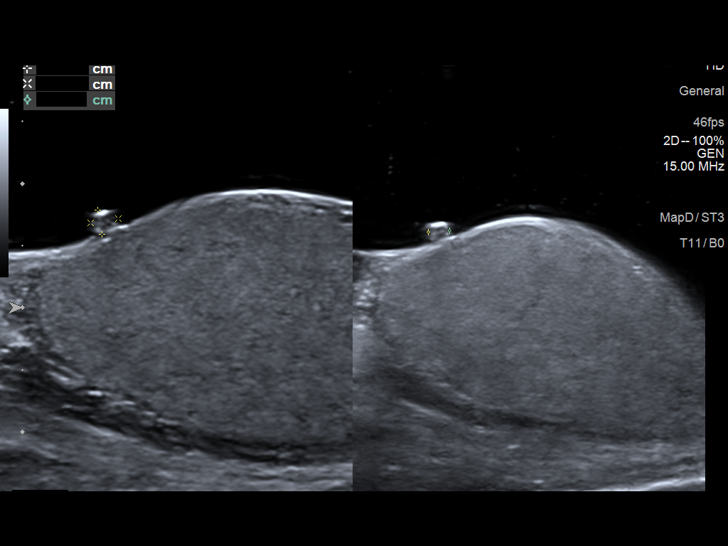
[im 108/118]
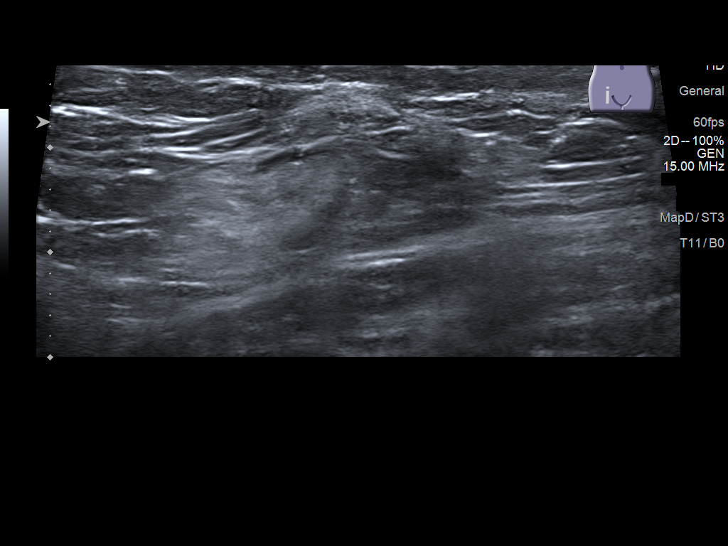
[im 118/118]
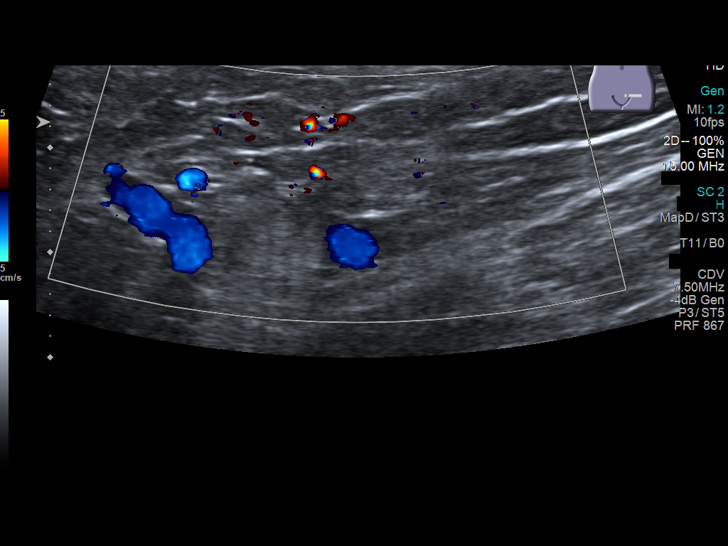

[13 of 25 positions shown; findings below may reference images not displayed]

FINDINGS: Right testicle

Measurements: 4.6 x 2.1 x 2.7 cm. No mass or microlithiasis
visualized.

Left testicle

Measurements: 3.9 x 2.1 x 2.6 cm. Peripherally in the mid left
testis is a complex cystic structure measuring approximately 3 x 3 x
2 mm, without associated abnormal vascularity, likely reflecting
tubular ectasia of rete testis. The left testis is otherwise normal
in echogenicity and with normal blood flow.

Right epididymis:  Normal in size and appearance.

Left epididymis: Tiny epididymal cyst measuring 3 mm maximally.
Otherwise normal in size and appearance.

Hydrocele: Large right-sided hydrocele. No significant left-sided
hydrocele.

Varicocele:  None visualized.

Pulsed Doppler interrogation of both testes demonstrates normal low
resistance arterial and venous waveforms bilaterally.
IMPRESSION: 1. Large right-sided hydrocele.
2. No suspicious testicular findings or abnormal blood flow.

## 2022-01-13 ENCOUNTER — Other Ambulatory Visit: Payer: Self-pay | Admitting: Cardiovascular Disease

## 2022-01-13 NOTE — Telephone Encounter (Signed)
Please schedule overdue 1 year F/U appointment for 90 day refills. Thank you! ?

## 2022-03-06 ENCOUNTER — Other Ambulatory Visit: Payer: Self-pay | Admitting: Cardiovascular Disease

## 2022-03-20 ENCOUNTER — Other Ambulatory Visit: Payer: Self-pay | Admitting: Cardiovascular Disease

## 2022-03-24 NOTE — Progress Notes (Signed)
Office Visit    Patient Name: Bobby Sandoval Date of Encounter: 03/24/2022  Primary Care Provider:  Gracelyn Nurse, MD Primary Cardiologist:  Lorine Bears, MD Primary Electrophysiologist: None  Chief Complaint    Bobby Sandoval is a 76 y.o. male with PMH of COPD, HTN, HLD, obesity, tobacco abuse, CAD s/p BMS to RCA and DES to mid LAD, abdominal aortic aneurysm, HFpEF, alcohol abuse who presents today for 69-month follow-up of HFpEF and CAD.  Past Medical History    Past Medical History:  Diagnosis Date   AAA (abdominal aortic aneurysm) (HCC)    a. 2012 3.4 cm; b. 07/2017 Abd u/s: Dist abd Ao dil up to 4.8cm; c. 03/2018 CT Abd: infrarenal AAA 5.3 x 5.7cm.   CAD (coronary artery disease)    a. 2007 Inf MI w/ BMS to RCA x 2; b. 2008 PCI/DES to mid LAD @ [facility]; c. 07/2015 Cath: LAD 100 prox to prev placed stent w/ R->L collats, RCA stents patent. EF 50-55%. Med Rx.   COPD (chronic obstructive pulmonary disease) (HCC)    Diastolic dysfunction    a. 02/2014 Echo: EF 55-65%. Gr1 DD, no rwma, mildly dil LA, nl RV fxn; b. 01/2018 Echo: EF 60-65%, Gr1 DD, mild MR, mildly dil LA. nl RV fxn. Nl PASP.   ETOH abuse    Hepatic steatosis    a. 03/2018 CT Abd: hepatic steatosis and possible early cirrhotic morphology of liver.   History of kidney stones    Hyperlipidemia    Hypertension    MI (myocardial infarction) (HCC)    Obesity    Panic disorder    Pneumonia    Tobacco abuse    Past Surgical History:  Procedure Laterality Date   25 GAUGE PARS PLANA VITRECTOMY WITH 20 GAUGE MVR PORT FOR MACULAR HOLE Right 01/14/2021   Procedure: 25 GAUGE PARS PLANA VITRECTOMY WITH 20 GAUGE MVR PORT FOR MACULAR HOLE;  Surgeon: Sherrie George, MD;  Location: [facility] OR;  Service: Ophthalmology;  Laterality: Right;   AIR/FLUID EXCHANGE Right 01/14/2021   Procedure: AIR/FLUID EXCHANGE;  Surgeon: Sherrie George, MD;  Location: [facility] OR;  Service: Ophthalmology;  Laterality: Right;   BACK SURGERY      CARDIAC CATHETERIZATION  08/2006   x1 stent [facility]. Mid RCA: 3.5 x 16 mm overlap with 3.5 x 24 mm Liberte bare-metal stents   CARDIAC CATHETERIZATION  10/2006   x1 stent [facility]: Mid LAD: 2.5 x 28 mm Cypher drug-eluting   CARDIAC CATHETERIZATION N/A 07/25/2015   Procedure: Left Heart Cath and Coronary Angiography;  Surgeon: Iran Ouch, MD;  Location: [facility] INVASIVE CV LAB;  Service: Cardiovascular;  Laterality: N/A;   COLONOSCOPY     coronary stents     ENDOVASCULAR REPAIR/STENT GRAFT N/A 06/08/2018   Procedure: ENDOVASCULAR REPAIR/STENT GRAFT;  Surgeon: Annice Needy, MD;  Location: [facility] INVASIVE CV LAB;  Service: Cardiovascular;  Laterality: N/A;   GAS INSERTION Right 01/14/2021   Procedure: INSERTION OF GAS;  Surgeon: Sherrie George, MD;  Location: [facility] OR;  Service: Ophthalmology;  Laterality: Right;   GAS/FLUID EXCHANGE Right 01/14/2021   Procedure: GAS/FLUID EXCHANGE;  Surgeon: Sherrie George, MD;  Location: [facility] OR;  Service: Ophthalmology;  Laterality: Right;   MEMBRANE PEEL Right 01/14/2021   Procedure: MEMBRANE PEEL;  Surgeon: Sherrie George, MD;  Location: [facility] OR;  Service: Ophthalmology;  Laterality: Right;   MICRODISCECTOMY LUMBAR     PHOTOCOAGULATION WITH LASER Right 01/14/2021   Procedure:  PHOTOCOAGULATION WITH LASER;  Surgeon: Sherrie George, MD;  Location: [facility] OR;  Service: Ophthalmology;  Laterality: Right;   SERUM PATCH Right 01/14/2021   Procedure: SERUM PATCH;  Surgeon: Sherrie George, MD;  Location: [facility] OR;  Service: Ophthalmology;  Laterality: Right;    Allergies  No Known Allergies  History of Present Illness    Bobby Sandoval is a 76 year old male with above-mentioned past medical history who presents today for 60-month follow-up of HFpEF and CAD.  He was last seen by Dr. Kirke Corin on 12/2020 for preoperative clearance.  Bobby Sandoval has extensive coronary artery disease history with BMS stenting to RCA in 2007 and DES stenting to LAD in 2008.  His most recent LHC was  completed 07/2015 which revealed occluded mid LAD stent with collaterals left and right with patent RCA stent.  This was managed with medical therapy and patient denied any recent chest pain.  Most recent 2D echo was completed 01/2018 with EF of 60-65%, mild concentric LVH, grade 1 DD, with mild MV regurgitation.  He underwent nuclear stress test which revealed abnormal region of ischemia that was consistent with previously occluded LAD.  In 2019 patient underwent endovascular repair of large AAA that was successfully treated with stent graft.  Prior to 2022 visit patient had not been seen for 2 years.  He suffers from chronic exertional dyspnea due to extensive and current tobacco abuse.  He also drinks a sixpack of beer daily.  He was granted surgical clearance for his surgery and no medication changes were made at visit.  Since last being seen in the office patient reports he has been doing well and denies any anginal or ischemic symptoms at this time.  He was euvolemic on examination today.  He reports compliance with his medications with the exception of atorvastatin that is taking sparingly at times.  He continues to smoke and drink daily.  He reports drinking 12 beers a day and 2 packs/cigarettes/day. The  patient denies chest pain, palpitations, dyspnea, PND, orthopnea, nausea, vomiting, dizziness, syncope, edema, weight gain, or early satiety.   Home Medications    Current Outpatient Medications  Medication Sig Dispense Refill   ALPRAZolam (XANAX) 0.5 MG tablet Take 0.5 mg by mouth 4 (four) times daily as needed for anxiety.     aspirin EC 81 MG tablet Take 1 tablet (81 mg total) by mouth daily. 90 tablet 3   atorvastatin (LIPITOR) 40 MG tablet TAKE 1 TABLET BY MOUTH ONCE A DAY 30 tablet 0   enalapril (VASOTEC) 20 MG tablet Take 1 tablet (20 mg total) by mouth 2 (two) times daily. 60 tablet 3   Multiple Vitamins-Minerals (PRESERVISION AREDS 2) CAPS Take 1 tablet by mouth 2 (two) times daily.      nitroGLYCERIN (NITROSTAT) 0.4 MG SL tablet Place 0.4 mg under the tongue every 5 (five) minutes as needed for chest pain.     torsemide (DEMADEX) 20 MG tablet Take 1 tablet (20 mg total) by mouth daily. (Patient taking differently: Take 20 mg by mouth daily as needed (swelling).) 30 tablet 0   No current facility-administered medications for this visit.     Review of Systems  Please see the history of present illness.     All other systems reviewed and are otherwise negative except as noted above.  Physical Exam    Wt Readings from Last 3 Encounters:  01/14/21 208 lb (94.3 kg)  12/19/20 211 lb (95.7 kg)  08/22/20 216 lb (98 kg)  ZO:XWRUE were no vitals filed for this visit.,There is no height or weight on file to calculate BMI.  Constitutional:      Appearance: Healthy appearance. Not in distress.  Neck:     Vascular: JVD normal.  Pulmonary:     Effort: Pulmonary effort is normal.     Breath sounds: No wheezing. No rales. Diminished in the bases Cardiovascular:     Normal rate. Regular rhythm. Normal S1. Normal S2.      Murmurs: There is no murmur.  Edema:    Peripheral edema absent.  Abdominal:     Palpations: Abdomen is soft non tender. There is no hepatomegaly.  Skin:    General: Skin is warm and dry.  Neurological:     General: No focal deficit present.     Mental Status: Alert and oriented to person, place and time.     Cranial Nerves: Cranial nerves are intact.  EKG/LABS/Other Studies Reviewed    ECG personally reviewed by me today -sinus rhythm with PAC with rate of 60 and no acute changes  Risk Assessment/Calculations:    Lab Results  Component Value Date   WBC 5.7 01/14/2021   HGB 18.3 (H) 01/14/2021   HCT 54.1 (H) 01/14/2021   MCV 96.6 01/14/2021   PLT 166 01/14/2021   Lab Results  Component Value Date   CREATININE 0.88 01/14/2021   BUN 6 (L) 01/14/2021   NA 134 (L) 01/14/2021   K 4.0 01/14/2021   CL 101 01/14/2021   CO2 26 01/14/2021   Lab  Results  Component Value Date   ALT 22 01/14/2021   AST 22 01/14/2021   ALKPHOS 63 01/14/2021   BILITOT 1.2 01/14/2021   Lab Results  Component Value Date   CHOL 174 02/03/2018   HDL 53 02/03/2018   LDLCALC 108 (H) 02/03/2018   TRIG 64 02/03/2018   CHOLHDL 3.3 02/03/2018    No results found for: "HGBA1C"  Assessment & Plan    1.  Coronary artery disease: -s/p BMS stenting to RCA in 2007 and DES stenting to LAD in 2008 with chronic total occluded LAD with collaterals -Denies anginal symptoms at this time -Continue GDMT with ASA 81 mg and atorvastatin 40 mg daily  2.  Essential hypertension: -Blood pressure today is well controlled at 130/70 -Continue enalapril 20 mg daily  3.  HFpEF: -Last EF was 60-65% and 2019 -Patient was euvolemic on examination today -Continue as needed torsemide  4.  Abdominal aortic aneurysm: -s/p endovascular stent repair in 2019 -Blood pressure is well controlled  5.  Hyperlipidemia: -Most recent LDL was 111, we discussed alternative statin therapies however patient is not interested at this time. -Patient advised to continue statin therapy and report any continued myalgias  6.  Tobacco abuse: -Patient currently smoking 2 packs/day and strongly advised to cut down and offered guidance and information regarding quitting -Patient is not interested at this time  7.  Alcohol abuse: -Patient currently drinking 12 beers a day and states that he does not wish to cut back on his drinking at this time. -The importance of alcohol in the setting of coronary artery disease and heart failure was discussed at length during today's visit  Disposition: Follow-up with Lorine Bears, MD or APP in 12 months   Medication Adjustments/Labs and Tests Ordered: Current medicines are reviewed at length with the patient today.  Concerns regarding medicines are outlined above.   Signed, Napoleon Form, Leodis Rains, NP 03/24/2022, 8:04 PM [facility]

## 2022-03-27 ENCOUNTER — Ambulatory Visit (INDEPENDENT_AMBULATORY_CARE_PROVIDER_SITE_OTHER): Payer: Medicare Other | Admitting: Nurse Practitioner

## 2022-03-27 ENCOUNTER — Encounter: Payer: Self-pay | Admitting: Nurse Practitioner

## 2022-03-27 VITALS — BP 130/70 | HR 60 | Ht 71.0 in | Wt 198.1 lb

## 2022-03-27 DIAGNOSIS — I10 Essential (primary) hypertension: Secondary | ICD-10-CM

## 2022-03-27 DIAGNOSIS — I714 Abdominal aortic aneurysm, without rupture, unspecified: Secondary | ICD-10-CM | POA: Diagnosis not present

## 2022-03-27 DIAGNOSIS — I2510 Atherosclerotic heart disease of native coronary artery without angina pectoris: Secondary | ICD-10-CM | POA: Diagnosis not present

## 2022-03-27 DIAGNOSIS — I5030 Unspecified diastolic (congestive) heart failure: Secondary | ICD-10-CM

## 2022-03-27 DIAGNOSIS — Z720 Tobacco use: Secondary | ICD-10-CM | POA: Diagnosis not present

## 2022-03-27 DIAGNOSIS — F1010 Alcohol abuse, uncomplicated: Secondary | ICD-10-CM

## 2022-03-27 DIAGNOSIS — E785 Hyperlipidemia, unspecified: Secondary | ICD-10-CM

## 2022-04-02 DIAGNOSIS — D0462 Carcinoma in situ of skin of left upper limb, including shoulder: Secondary | ICD-10-CM | POA: Diagnosis not present

## 2022-04-02 DIAGNOSIS — D2262 Melanocytic nevi of left upper limb, including shoulder: Secondary | ICD-10-CM | POA: Diagnosis not present

## 2022-04-02 DIAGNOSIS — D0359 Melanoma in situ of other part of trunk: Secondary | ICD-10-CM | POA: Diagnosis not present

## 2022-04-02 DIAGNOSIS — L218 Other seborrheic dermatitis: Secondary | ICD-10-CM | POA: Diagnosis not present

## 2022-04-02 DIAGNOSIS — L821 Other seborrheic keratosis: Secondary | ICD-10-CM | POA: Diagnosis not present

## 2022-04-02 DIAGNOSIS — Z85828 Personal history of other malignant neoplasm of skin: Secondary | ICD-10-CM | POA: Diagnosis not present

## 2022-04-15 ENCOUNTER — Encounter (INDEPENDENT_AMBULATORY_CARE_PROVIDER_SITE_OTHER): Payer: Medicare Other | Admitting: Ophthalmology

## 2022-04-15 DIAGNOSIS — I10 Essential (primary) hypertension: Secondary | ICD-10-CM

## 2022-04-15 DIAGNOSIS — H43812 Vitreous degeneration, left eye: Secondary | ICD-10-CM

## 2022-04-15 DIAGNOSIS — H35341 Macular cyst, hole, or pseudohole, right eye: Secondary | ICD-10-CM | POA: Diagnosis not present

## 2022-04-15 DIAGNOSIS — H35033 Hypertensive retinopathy, bilateral: Secondary | ICD-10-CM

## 2022-04-15 DIAGNOSIS — H353132 Nonexudative age-related macular degeneration, bilateral, intermediate dry stage: Secondary | ICD-10-CM

## 2022-04-20 DIAGNOSIS — D0359 Melanoma in situ of other part of trunk: Secondary | ICD-10-CM | POA: Diagnosis not present

## 2022-04-27 ENCOUNTER — Encounter (INDEPENDENT_AMBULATORY_CARE_PROVIDER_SITE_OTHER): Payer: Medicare Other | Admitting: Ophthalmology

## 2022-05-04 DIAGNOSIS — D0462 Carcinoma in situ of skin of left upper limb, including shoulder: Secondary | ICD-10-CM | POA: Diagnosis not present

## 2022-05-13 ENCOUNTER — Encounter (INDEPENDENT_AMBULATORY_CARE_PROVIDER_SITE_OTHER): Payer: Medicare Other | Admitting: Ophthalmology

## 2022-05-13 DIAGNOSIS — H353221 Exudative age-related macular degeneration, left eye, with active choroidal neovascularization: Secondary | ICD-10-CM

## 2022-05-29 ENCOUNTER — Other Ambulatory Visit: Payer: Self-pay | Admitting: Cardiovascular Disease

## 2022-06-10 ENCOUNTER — Encounter (INDEPENDENT_AMBULATORY_CARE_PROVIDER_SITE_OTHER): Payer: Medicare Other | Admitting: Ophthalmology

## 2022-06-10 DIAGNOSIS — H353221 Exudative age-related macular degeneration, left eye, with active choroidal neovascularization: Secondary | ICD-10-CM | POA: Diagnosis not present

## 2022-06-10 DIAGNOSIS — I10 Essential (primary) hypertension: Secondary | ICD-10-CM | POA: Diagnosis not present

## 2022-06-10 DIAGNOSIS — H353112 Nonexudative age-related macular degeneration, right eye, intermediate dry stage: Secondary | ICD-10-CM

## 2022-06-10 DIAGNOSIS — H43812 Vitreous degeneration, left eye: Secondary | ICD-10-CM

## 2022-06-10 DIAGNOSIS — H35033 Hypertensive retinopathy, bilateral: Secondary | ICD-10-CM | POA: Diagnosis not present

## 2022-06-24 DIAGNOSIS — Z125 Encounter for screening for malignant neoplasm of prostate: Secondary | ICD-10-CM | POA: Diagnosis not present

## 2022-06-24 DIAGNOSIS — I10 Essential (primary) hypertension: Secondary | ICD-10-CM | POA: Diagnosis not present

## 2022-07-01 DIAGNOSIS — F17210 Nicotine dependence, cigarettes, uncomplicated: Secondary | ICD-10-CM | POA: Diagnosis not present

## 2022-07-01 DIAGNOSIS — Z0000 Encounter for general adult medical examination without abnormal findings: Secondary | ICD-10-CM | POA: Diagnosis not present

## 2022-07-01 DIAGNOSIS — Z23 Encounter for immunization: Secondary | ICD-10-CM | POA: Diagnosis not present

## 2022-07-01 DIAGNOSIS — Z0001 Encounter for general adult medical examination with abnormal findings: Secondary | ICD-10-CM | POA: Diagnosis not present

## 2022-07-07 ENCOUNTER — Encounter (INDEPENDENT_AMBULATORY_CARE_PROVIDER_SITE_OTHER): Payer: Medicare Other | Admitting: Ophthalmology

## 2022-07-13 ENCOUNTER — Encounter (INDEPENDENT_AMBULATORY_CARE_PROVIDER_SITE_OTHER): Payer: Medicare Other | Admitting: Ophthalmology

## 2022-07-13 DIAGNOSIS — H35033 Hypertensive retinopathy, bilateral: Secondary | ICD-10-CM | POA: Diagnosis not present

## 2022-07-13 DIAGNOSIS — I10 Essential (primary) hypertension: Secondary | ICD-10-CM

## 2022-07-13 DIAGNOSIS — H353112 Nonexudative age-related macular degeneration, right eye, intermediate dry stage: Secondary | ICD-10-CM | POA: Diagnosis not present

## 2022-07-13 DIAGNOSIS — H353221 Exudative age-related macular degeneration, left eye, with active choroidal neovascularization: Secondary | ICD-10-CM

## 2022-07-13 DIAGNOSIS — H35341 Macular cyst, hole, or pseudohole, right eye: Secondary | ICD-10-CM

## 2022-07-13 DIAGNOSIS — H43812 Vitreous degeneration, left eye: Secondary | ICD-10-CM

## 2022-08-17 ENCOUNTER — Encounter (INDEPENDENT_AMBULATORY_CARE_PROVIDER_SITE_OTHER): Payer: Medicare Other | Admitting: Ophthalmology

## 2022-08-17 DIAGNOSIS — H353221 Exudative age-related macular degeneration, left eye, with active choroidal neovascularization: Secondary | ICD-10-CM

## 2022-08-17 DIAGNOSIS — H43812 Vitreous degeneration, left eye: Secondary | ICD-10-CM

## 2022-08-17 DIAGNOSIS — I10 Essential (primary) hypertension: Secondary | ICD-10-CM | POA: Diagnosis not present

## 2022-08-17 DIAGNOSIS — H35033 Hypertensive retinopathy, bilateral: Secondary | ICD-10-CM | POA: Diagnosis not present

## 2022-08-17 DIAGNOSIS — H35341 Macular cyst, hole, or pseudohole, right eye: Secondary | ICD-10-CM

## 2022-08-17 DIAGNOSIS — H353112 Nonexudative age-related macular degeneration, right eye, intermediate dry stage: Secondary | ICD-10-CM | POA: Diagnosis not present

## 2022-09-18 ENCOUNTER — Telehealth: Payer: Self-pay | Admitting: Cardiovascular Disease

## 2022-09-18 MED ORDER — NITROGLYCERIN 0.4 MG SL SUBL: 0.4000 mg | SUBLINGUAL_TABLET | SUBLINGUAL | 1 refills | Status: AC | PRN

## 2022-09-18 NOTE — Telephone Encounter (Signed)
Requested Prescriptions   Signed Prescriptions Disp Refills   nitroGLYCERIN (NITROSTAT) 0.4 MG SL tablet 25 tablet 1    Sig: Place 1 tablet (0.4 mg total) under the tongue every 5 (five) minutes as needed for chest pain.    Authorizing Provider: Lorine Bears A    Ordering User: Thayer Headings, Sabree Nuon L

## 2022-09-18 NOTE — Telephone Encounter (Signed)
*  STAT* If patient is at the pharmacy, call can be transferred to refill team.   1. Which medications need to be refilled? (please list name of each medication and dose if known) nitroGLYCERIN (NITROSTAT) 0.4 MG SL tablet   2. Which pharmacy/location (including street and city if local pharmacy) is medication to be sent to? [facility] - [place] - [address]    3. Do they need a 30 day or 90 day supply? 30   Pt hasn't been using this medication, pharmacist from [facility] ([place]) called stating pt brought up that this medication was old and pt would like a new refill just in case he needs it.

## 2022-09-21 ENCOUNTER — Encounter (INDEPENDENT_AMBULATORY_CARE_PROVIDER_SITE_OTHER): Payer: Medicare Other | Admitting: Ophthalmology

## 2022-09-21 DIAGNOSIS — H353221 Exudative age-related macular degeneration, left eye, with active choroidal neovascularization: Secondary | ICD-10-CM

## 2022-09-21 DIAGNOSIS — H353112 Nonexudative age-related macular degeneration, right eye, intermediate dry stage: Secondary | ICD-10-CM

## 2022-09-21 DIAGNOSIS — H43812 Vitreous degeneration, left eye: Secondary | ICD-10-CM

## 2022-09-21 DIAGNOSIS — H35033 Hypertensive retinopathy, bilateral: Secondary | ICD-10-CM | POA: Diagnosis not present

## 2022-09-21 DIAGNOSIS — H35371 Puckering of macula, right eye: Secondary | ICD-10-CM | POA: Diagnosis not present

## 2022-09-21 DIAGNOSIS — I10 Essential (primary) hypertension: Secondary | ICD-10-CM | POA: Diagnosis not present

## 2022-10-06 DIAGNOSIS — D485 Neoplasm of uncertain behavior of skin: Secondary | ICD-10-CM | POA: Diagnosis not present

## 2022-10-06 DIAGNOSIS — D0462 Carcinoma in situ of skin of left upper limb, including shoulder: Secondary | ICD-10-CM | POA: Diagnosis not present

## 2022-10-06 DIAGNOSIS — D2262 Melanocytic nevi of left upper limb, including shoulder: Secondary | ICD-10-CM | POA: Diagnosis not present

## 2022-10-06 DIAGNOSIS — D2261 Melanocytic nevi of right upper limb, including shoulder: Secondary | ICD-10-CM | POA: Diagnosis not present

## 2022-10-06 DIAGNOSIS — X32XXXA Exposure to sunlight, initial encounter: Secondary | ICD-10-CM | POA: Diagnosis not present

## 2022-10-06 DIAGNOSIS — D2271 Melanocytic nevi of right lower limb, including hip: Secondary | ICD-10-CM | POA: Diagnosis not present

## 2022-10-06 DIAGNOSIS — L570 Actinic keratosis: Secondary | ICD-10-CM | POA: Diagnosis not present

## 2022-10-06 DIAGNOSIS — Z85828 Personal history of other malignant neoplasm of skin: Secondary | ICD-10-CM | POA: Diagnosis not present

## 2022-10-13 DIAGNOSIS — D0462 Carcinoma in situ of skin of left upper limb, including shoulder: Secondary | ICD-10-CM | POA: Diagnosis not present

## 2022-10-26 ENCOUNTER — Encounter (INDEPENDENT_AMBULATORY_CARE_PROVIDER_SITE_OTHER): Payer: Medicare Other | Admitting: Ophthalmology

## 2022-10-26 DIAGNOSIS — I10 Essential (primary) hypertension: Secondary | ICD-10-CM

## 2022-10-26 DIAGNOSIS — H353112 Nonexudative age-related macular degeneration, right eye, intermediate dry stage: Secondary | ICD-10-CM

## 2022-10-26 DIAGNOSIS — H35341 Macular cyst, hole, or pseudohole, right eye: Secondary | ICD-10-CM

## 2022-10-26 DIAGNOSIS — H35033 Hypertensive retinopathy, bilateral: Secondary | ICD-10-CM

## 2022-10-26 DIAGNOSIS — H43812 Vitreous degeneration, left eye: Secondary | ICD-10-CM

## 2022-10-26 DIAGNOSIS — H353221 Exudative age-related macular degeneration, left eye, with active choroidal neovascularization: Secondary | ICD-10-CM

## 2022-10-29 DIAGNOSIS — K080 Exfoliation of teeth due to systemic causes: Secondary | ICD-10-CM | POA: Diagnosis not present

## 2022-11-11 DIAGNOSIS — K080 Exfoliation of teeth due to systemic causes: Secondary | ICD-10-CM | POA: Diagnosis not present

## 2022-12-01 ENCOUNTER — Encounter (INDEPENDENT_AMBULATORY_CARE_PROVIDER_SITE_OTHER): Payer: Medicare Other | Admitting: Ophthalmology

## 2022-12-01 DIAGNOSIS — H353221 Exudative age-related macular degeneration, left eye, with active choroidal neovascularization: Secondary | ICD-10-CM | POA: Diagnosis not present

## 2022-12-01 DIAGNOSIS — H353112 Nonexudative age-related macular degeneration, right eye, intermediate dry stage: Secondary | ICD-10-CM

## 2022-12-01 DIAGNOSIS — H35033 Hypertensive retinopathy, bilateral: Secondary | ICD-10-CM

## 2022-12-01 DIAGNOSIS — I10 Essential (primary) hypertension: Secondary | ICD-10-CM | POA: Diagnosis not present

## 2022-12-01 DIAGNOSIS — H43812 Vitreous degeneration, left eye: Secondary | ICD-10-CM

## 2022-12-30 DIAGNOSIS — Z0000 Encounter for general adult medical examination without abnormal findings: Secondary | ICD-10-CM | POA: Diagnosis not present

## 2022-12-30 DIAGNOSIS — F17210 Nicotine dependence, cigarettes, uncomplicated: Secondary | ICD-10-CM | POA: Diagnosis not present

## 2022-12-30 DIAGNOSIS — J4489 Other specified chronic obstructive pulmonary disease: Secondary | ICD-10-CM | POA: Diagnosis not present

## 2022-12-30 DIAGNOSIS — I10 Essential (primary) hypertension: Secondary | ICD-10-CM | POA: Diagnosis not present

## 2022-12-30 DIAGNOSIS — E7800 Pure hypercholesterolemia, unspecified: Secondary | ICD-10-CM | POA: Diagnosis not present

## 2022-12-30 DIAGNOSIS — I2510 Atherosclerotic heart disease of native coronary artery without angina pectoris: Secondary | ICD-10-CM | POA: Diagnosis not present

## 2023-01-05 ENCOUNTER — Encounter (INDEPENDENT_AMBULATORY_CARE_PROVIDER_SITE_OTHER): Payer: Medicare Other | Admitting: Ophthalmology

## 2023-01-05 DIAGNOSIS — H35033 Hypertensive retinopathy, bilateral: Secondary | ICD-10-CM | POA: Diagnosis not present

## 2023-01-05 DIAGNOSIS — I10 Essential (primary) hypertension: Secondary | ICD-10-CM

## 2023-01-05 DIAGNOSIS — H353221 Exudative age-related macular degeneration, left eye, with active choroidal neovascularization: Secondary | ICD-10-CM

## 2023-01-05 DIAGNOSIS — H353112 Nonexudative age-related macular degeneration, right eye, intermediate dry stage: Secondary | ICD-10-CM | POA: Diagnosis not present

## 2023-01-05 DIAGNOSIS — H35341 Macular cyst, hole, or pseudohole, right eye: Secondary | ICD-10-CM

## 2023-01-05 DIAGNOSIS — H43812 Vitreous degeneration, left eye: Secondary | ICD-10-CM

## 2023-02-16 ENCOUNTER — Encounter (INDEPENDENT_AMBULATORY_CARE_PROVIDER_SITE_OTHER): Payer: Medicare Other | Admitting: Ophthalmology

## 2023-02-16 DIAGNOSIS — H353221 Exudative age-related macular degeneration, left eye, with active choroidal neovascularization: Secondary | ICD-10-CM

## 2023-02-16 DIAGNOSIS — H353112 Nonexudative age-related macular degeneration, right eye, intermediate dry stage: Secondary | ICD-10-CM

## 2023-02-16 DIAGNOSIS — H35033 Hypertensive retinopathy, bilateral: Secondary | ICD-10-CM

## 2023-02-16 DIAGNOSIS — I10 Essential (primary) hypertension: Secondary | ICD-10-CM

## 2023-02-16 DIAGNOSIS — H35341 Macular cyst, hole, or pseudohole, right eye: Secondary | ICD-10-CM

## 2023-02-16 DIAGNOSIS — H43812 Vitreous degeneration, left eye: Secondary | ICD-10-CM

## 2023-02-22 DIAGNOSIS — D2271 Melanocytic nevi of right lower limb, including hip: Secondary | ICD-10-CM | POA: Diagnosis not present

## 2023-02-22 DIAGNOSIS — D485 Neoplasm of uncertain behavior of skin: Secondary | ICD-10-CM | POA: Diagnosis not present

## 2023-02-22 DIAGNOSIS — L570 Actinic keratosis: Secondary | ICD-10-CM | POA: Diagnosis not present

## 2023-02-22 DIAGNOSIS — Z85828 Personal history of other malignant neoplasm of skin: Secondary | ICD-10-CM | POA: Diagnosis not present

## 2023-02-22 DIAGNOSIS — C44529 Squamous cell carcinoma of skin of other part of trunk: Secondary | ICD-10-CM | POA: Diagnosis not present

## 2023-02-22 DIAGNOSIS — Z85820 Personal history of malignant melanoma of skin: Secondary | ICD-10-CM | POA: Diagnosis not present

## 2023-02-22 DIAGNOSIS — D225 Melanocytic nevi of trunk: Secondary | ICD-10-CM | POA: Diagnosis not present

## 2023-02-22 DIAGNOSIS — Z872 Personal history of diseases of the skin and subcutaneous tissue: Secondary | ICD-10-CM | POA: Diagnosis not present

## 2023-03-02 DIAGNOSIS — C44529 Squamous cell carcinoma of skin of other part of trunk: Secondary | ICD-10-CM | POA: Diagnosis not present

## 2023-03-22 DIAGNOSIS — K080 Exfoliation of teeth due to systemic causes: Secondary | ICD-10-CM | POA: Diagnosis not present

## 2023-03-26 ENCOUNTER — Other Ambulatory Visit: Payer: Self-pay | Admitting: Cardiovascular Disease

## 2023-03-26 NOTE — Telephone Encounter (Signed)
Please contact pt for future appointment. 

## 2023-04-06 ENCOUNTER — Encounter (INDEPENDENT_AMBULATORY_CARE_PROVIDER_SITE_OTHER): Payer: Medicare Other | Admitting: Ophthalmology

## 2023-04-06 DIAGNOSIS — H43812 Vitreous degeneration, left eye: Secondary | ICD-10-CM

## 2023-04-06 DIAGNOSIS — I10 Essential (primary) hypertension: Secondary | ICD-10-CM

## 2023-04-06 DIAGNOSIS — H353221 Exudative age-related macular degeneration, left eye, with active choroidal neovascularization: Secondary | ICD-10-CM

## 2023-04-06 DIAGNOSIS — H353112 Nonexudative age-related macular degeneration, right eye, intermediate dry stage: Secondary | ICD-10-CM | POA: Diagnosis not present

## 2023-04-06 DIAGNOSIS — H35033 Hypertensive retinopathy, bilateral: Secondary | ICD-10-CM | POA: Diagnosis not present

## 2023-04-15 DIAGNOSIS — I110 Hypertensive heart disease with heart failure: Secondary | ICD-10-CM | POA: Diagnosis not present

## 2023-04-19 ENCOUNTER — Other Ambulatory Visit: Payer: Self-pay | Admitting: Cardiovascular Disease

## 2023-04-19 NOTE — Telephone Encounter (Signed)
Please schedule overdue 12 month F/U appointment for for 90 day refills. Thank you!

## 2023-05-18 ENCOUNTER — Encounter (INDEPENDENT_AMBULATORY_CARE_PROVIDER_SITE_OTHER): Payer: Medicare Other | Admitting: Ophthalmology

## 2023-05-18 DIAGNOSIS — H353112 Nonexudative age-related macular degeneration, right eye, intermediate dry stage: Secondary | ICD-10-CM | POA: Diagnosis not present

## 2023-05-18 DIAGNOSIS — I10 Essential (primary) hypertension: Secondary | ICD-10-CM | POA: Diagnosis not present

## 2023-05-18 DIAGNOSIS — H353221 Exudative age-related macular degeneration, left eye, with active choroidal neovascularization: Secondary | ICD-10-CM | POA: Diagnosis not present

## 2023-05-18 DIAGNOSIS — H35033 Hypertensive retinopathy, bilateral: Secondary | ICD-10-CM | POA: Diagnosis not present

## 2023-05-18 DIAGNOSIS — H43812 Vitreous degeneration, left eye: Secondary | ICD-10-CM

## 2023-05-25 ENCOUNTER — Encounter (INDEPENDENT_AMBULATORY_CARE_PROVIDER_SITE_OTHER): Payer: Non-veteran care | Admitting: Ophthalmology

## 2023-06-02 ENCOUNTER — Other Ambulatory Visit: Payer: Self-pay | Admitting: Cardiovascular Disease

## 2023-06-16 DIAGNOSIS — D380 Neoplasm of uncertain behavior of larynx: Secondary | ICD-10-CM | POA: Diagnosis not present

## 2023-06-16 DIAGNOSIS — J3801 Paralysis of vocal cords and larynx, unilateral: Secondary | ICD-10-CM | POA: Diagnosis not present

## 2023-06-16 DIAGNOSIS — F17200 Nicotine dependence, unspecified, uncomplicated: Secondary | ICD-10-CM | POA: Diagnosis not present

## 2023-06-17 ENCOUNTER — Other Ambulatory Visit: Payer: Self-pay | Admitting: Student

## 2023-06-17 ENCOUNTER — Encounter: Payer: Self-pay | Admitting: Student

## 2023-06-17 DIAGNOSIS — J3802 Paralysis of vocal cords and larynx, bilateral: Secondary | ICD-10-CM

## 2023-06-17 DIAGNOSIS — D380 Neoplasm of uncertain behavior of larynx: Secondary | ICD-10-CM

## 2023-06-22 ENCOUNTER — Ambulatory Visit
Admission: RE | Admit: 2023-06-22 | Discharge: 2023-06-22 | Disposition: A | Discharge status: Home / Self Care | Payer: Medicare Other | Source: Ambulatory Visit | Attending: Student | Admitting: Student

## 2023-06-22 DIAGNOSIS — D380 Neoplasm of uncertain behavior of larynx: Secondary | ICD-10-CM

## 2023-06-22 DIAGNOSIS — J3802 Paralysis of vocal cords and larynx, bilateral: Secondary | ICD-10-CM

## 2023-06-22 DIAGNOSIS — I700 Atherosclerosis of aorta: Secondary | ICD-10-CM | POA: Diagnosis not present

## 2023-06-22 DIAGNOSIS — I10 Essential (primary) hypertension: Secondary | ICD-10-CM | POA: Diagnosis not present

## 2023-06-22 MED ORDER — IOPAMIDOL (ISOVUE-300) INJECTION 61%
100.0000 mL | Freq: Once | INTRAVENOUS | Status: AC | PRN
  Administered 2023-06-22: 100 mL via INTRAVENOUS

## 2023-06-22 MED ORDER — IOPAMIDOL (ISOVUE-300) INJECTION 61%: 100.0000 mL | Freq: Once | INTRAVENOUS | Status: DC | PRN

## 2023-06-23 ENCOUNTER — Other Ambulatory Visit: Payer: Self-pay | Admitting: Cardiovascular Disease

## 2023-06-25 ENCOUNTER — Other Ambulatory Visit: Payer: Self-pay | Admitting: Cardiovascular Disease

## 2023-06-28 ENCOUNTER — Other Ambulatory Visit: Payer: Self-pay | Admitting: Unknown Physician Specialty

## 2023-06-30 ENCOUNTER — Telehealth: Payer: Self-pay | Admitting: Nurse Practitioner

## 2023-06-30 NOTE — Telephone Encounter (Signed)
Do to staffing, we need to reschedule the appointment

## 2023-07-01 DIAGNOSIS — I10 Essential (primary) hypertension: Secondary | ICD-10-CM | POA: Diagnosis not present

## 2023-07-01 DIAGNOSIS — Z125 Encounter for screening for malignant neoplasm of prostate: Secondary | ICD-10-CM | POA: Diagnosis not present

## 2023-07-06 ENCOUNTER — Encounter (INDEPENDENT_AMBULATORY_CARE_PROVIDER_SITE_OTHER): Payer: Medicare Other | Admitting: Ophthalmology

## 2023-07-06 DIAGNOSIS — H35033 Hypertensive retinopathy, bilateral: Secondary | ICD-10-CM

## 2023-07-06 DIAGNOSIS — H353112 Nonexudative age-related macular degeneration, right eye, intermediate dry stage: Secondary | ICD-10-CM

## 2023-07-06 DIAGNOSIS — H353221 Exudative age-related macular degeneration, left eye, with active choroidal neovascularization: Secondary | ICD-10-CM

## 2023-07-06 DIAGNOSIS — H43812 Vitreous degeneration, left eye: Secondary | ICD-10-CM

## 2023-07-06 DIAGNOSIS — I10 Essential (primary) hypertension: Secondary | ICD-10-CM | POA: Diagnosis not present

## 2023-07-08 ENCOUNTER — Ambulatory Visit: Payer: Medicare Other | Admitting: Nurse Practitioner

## 2023-07-08 DIAGNOSIS — J449 Chronic obstructive pulmonary disease, unspecified: Secondary | ICD-10-CM | POA: Diagnosis not present

## 2023-07-08 DIAGNOSIS — Z0001 Encounter for general adult medical examination with abnormal findings: Secondary | ICD-10-CM | POA: Diagnosis not present

## 2023-07-08 DIAGNOSIS — I10 Essential (primary) hypertension: Secondary | ICD-10-CM | POA: Diagnosis not present

## 2023-07-08 DIAGNOSIS — I2510 Atherosclerotic heart disease of native coronary artery without angina pectoris: Secondary | ICD-10-CM | POA: Diagnosis not present

## 2023-07-08 DIAGNOSIS — F17210 Nicotine dependence, cigarettes, uncomplicated: Secondary | ICD-10-CM | POA: Diagnosis not present

## 2023-07-08 DIAGNOSIS — Z23 Encounter for immunization: Secondary | ICD-10-CM | POA: Diagnosis not present

## 2023-07-12 ENCOUNTER — Encounter: Payer: Self-pay | Admitting: Unknown Physician Specialty

## 2023-07-12 NOTE — Anesthesia Preprocedure Evaluation (Addendum)
Anesthesia Evaluation  Patient identified by MRN, date of birth, ID band Patient awake    Reviewed: Allergy & Precautions, NPO status , Patient's Chart, lab work & pertinent test results  History of Anesthesia Complications Negative for: history of anesthetic complications  Airway Mallampati: II  TM Distance: >3 FB Neck ROM: Full   Comment: Hoarse voice at baseline. No airway obstruction or masses on CT neck Dental  (+) Edentulous Upper, Edentulous Lower   Pulmonary neg sleep apnea, COPD, Current SmokerPatient did not abstain from smoking. 11-08-19 1. No acute intrathoracic findings. 2. Advanced emphysema (ICD10-J43.9). 3. New 6 mm pulmonary nodule within the right upper lobe. Non-contrast chest CT at 6-12 months is recommended. If the nodule is stable at time of repeat CT, then future CT at 18-24 months (from today's scan) is considered optional for low-risk patients, but is recommended for high-risk patients. This recommendation follows the consensus statement: Guidelines for Management of Incidental Pulmonary Nodules Detected on CT Images: From the Fleischner Society 2017; Radiology 2017; 284:228-243. 4. Cholelithiasis. 5. Nonobstructing left renal stone. 6. Aortic and coronary artery atherosclerosis (ICD10-I70.0). 7. Partially imaged abdominal aortic aneurysm status post stent graft placement.  03-27-22 office visit notes Chronic exertional dyspnea  reports drinking 12 beers a day and 2 packs/cigarettes/day Napoleon Form, Leodis Rains, NP    + decreased breath sounds      Cardiovascular Exercise Tolerance: Good METShypertension, + CAD, + Past MI and + Cardiac Stents  (-) dysrhythmias  Rhythm:Regular Rate:Normal - Systolic murmurs 16-10-9 LM lesion, 15% stenosed.  Mid LAD to Dist LAD lesion, 20% stenosed. The lesion was previously treated with a stent (unknown type)greater than two years ago.  Mid LAD lesion, 100%  stenosed.  Prox RCA lesion, 30% stenosed.  Dist RCA lesion, 40% stenosed.  Post Atrio lesion, 50% stenosed.  Mid RCA lesion, 20% stenosed. The lesion was previously treated with a stent (unknown type).  The left ventricular systolic function is normal.   1. Significant underlying three-vessel coronary artery disease with patent RCA stent. Occluded mid LAD proximal to a previously placed stent with right-to-left collaterals. The LAD appears to be diffusely diseased in multiple areas. 2. Low normal LV systolic function with an ejection fraction of 50-55%. 3. Mildly elevated left ventricular end-diastolic pressure (EDP between 17-20 mmHg)   Recommendations: The LAD appears to be chronically occluded with TIMI 1 flow and reasonable right-to-left collaterals. The LAD itself appears to be diffusely disease in the midsegment and might not be optimal for PCI. I recommend attempting optimizing medical therapy and increasing antianginal medications before considering LAD PCI.   01-27-18 Left ventricle: The cavity size was normal. There was mild    concentric hypertrophy. Systolic function was normal. The    estimated ejection fraction was in the range of 60% to 65%. Wall    motion was normal; there were no regional wall motion    abnormalities. Doppler parameters are consistent with abnormal    left ventricular relaxation (grade 1 diastolic dysfunction).  - Mitral valve: There was mild regurgitation.  - Left atrium: The atrium was mildly dilated.  - Right ventricle: Systolic function was normal.  - Pulmonary arteries: Systolic pressure was within the normal    range.   extensive coronary artery disease history with BMS stenting to RCA in 2007 and DES stenting to LAD in 2008.  His most recent LHC was completed 07/2015 which revealed occluded mid LAD stent with collaterals left and right with patent RCA stent.  This was  managed with medical therapy and patient denied any recent chest pain.  Most  recent 2D echo was completed 01/2018 with EF of 60-65%, mild concentric LVH, grade 1 DD, with mild MV regurgitation.  He underwent nuclear stress test which revealed abnormal region of ischemia that was consistent with previously occluded LAD.  In 2019 patient underwent endovascular repair of large AAA that was successfully treated with stent graft.     Neuro/Psych  PSYCHIATRIC DISORDERS Anxiety     negative neurological ROS     GI/Hepatic ,neg GERD  ,,(+)     substance abuse  alcohol useAt least one six pack a day   Endo/Other  neg diabetes    Renal/GU negative Renal ROS     Musculoskeletal   Abdominal   Peds  Hematology   Anesthesia Other Findings Past Medical History: No date: AAA (abdominal aortic aneurysm) (HCC)     Comment:  a. 2012 3.4 cm; b. 07/2017 Abd u/s: Dist abd Ao dil up               to 4.8cm; c. 03/2018 CT Abd: infrarenal AAA 5.3 x 5.7cm;               d. 06/2018 EVAR (28mm prox/52mm dist, 18cm length Gore               Excluder Endoprosthesis). No date: Arthritis No date: CAD (coronary artery disease)     Comment:  a. 2007 Inf MI w/ BMS to RCA x 2; b. 2008 PCI/DES to mid              LAD @ [facility]; c. 07/2015 Cath: LAD 100 prox to prev placed               stent w/ R->L collats, RCA stents patent. EF 50-55%. Med               Rx; d. 04/2018 MV: EF 48%, mid-dist ant/apical/apical-lat               ischemia->med rx. No date: COPD (chronic obstructive pulmonary disease) (HCC) No date: Diastolic dysfunction     Comment:  a. 02/2014 Echo: EF 55-65%. Gr1 DD, no rwma, mildly dil               LA, nl RV fxn; b. 01/2018 Echo: EF 60-65%, Gr1 DD, mild               MR, mildly dil LA. nl RV fxn. Nl PASP. No date: ETOH abuse No date: Grade I diastolic dysfunction No date: Hepatic steatosis     Comment:  a. 03/2018 CT Abd: hepatic steatosis and possible early               cirrhotic morphology of liver. No date: History of heart attack No date: History of kidney stones No  date: Hyperlipidemia No date: Hypertension No date: MI (myocardial infarction) (HCC) No date: Mild mitral regurgitation by prior echocardiogram No date: Obesity No date: Panic disorder No date: Panic disorder No date: Status post AAA (abdominal aortic aneurysm) repair No date: Tobacco abuse  Reproductive/Obstetrics                             Anesthesia Physical Anesthesia Plan  ASA: 3  Anesthesia Plan: General   Post-op Pain Management: Ofirmev IV (intra-op)*   Induction: Intravenous  PONV Risk Score and Plan: 2 and Ondansetron, Dexamethasone and Midazolam  Airway  Management Planned: Oral ETT and Video Laryngoscope Planned  Additional Equipment: None  Intra-op Plan:   Post-operative Plan: Extubation in OR  Informed Consent: I have reviewed the patients History and Physical, chart, labs and discussed the procedure including the risks, benefits and alternatives for the proposed anesthesia with the patient or authorized representative who has indicated his/her understanding and acceptance.     Dental advisory given  Plan Discussed with: CRNA and Surgeon  Anesthesia Plan Comments: (Discussed risks of anesthesia with patient, including PONV, sore throat, lip/dental/eye damage. Rare risks discussed as well, such as cardiorespiratory and neurological sequelae, and allergic reactions. Discussed the role of CRNA in patient's perioperative care. Patient understands. Patient counseled on benefits of smoking cessation, and increased perioperative risks associated with continued smoking. )       Anesthesia Quick Evaluation

## 2023-07-15 ENCOUNTER — Other Ambulatory Visit: Payer: Self-pay | Admitting: Unknown Physician Specialty

## 2023-07-15 DIAGNOSIS — J3801 Paralysis of vocal cords and larynx, unilateral: Secondary | ICD-10-CM | POA: Diagnosis not present

## 2023-07-15 DIAGNOSIS — D380 Neoplasm of uncertain behavior of larynx: Secondary | ICD-10-CM | POA: Diagnosis not present

## 2023-07-15 DIAGNOSIS — F17200 Nicotine dependence, unspecified, uncomplicated: Secondary | ICD-10-CM | POA: Diagnosis not present

## 2023-07-20 ENCOUNTER — Ambulatory Visit: Payer: Self-pay | Admitting: Nurse Practitioner

## 2023-07-20 ENCOUNTER — Other Ambulatory Visit: Payer: Self-pay

## 2023-07-20 ENCOUNTER — Ambulatory Visit: Payer: Medicare Other | Admitting: Anesthesiology

## 2023-07-20 ENCOUNTER — Encounter
Admission: RE | Disposition: A | Discharge status: Home / Self Care | Payer: Self-pay | Source: Home / Self Care | Attending: Unknown Physician Specialty

## 2023-07-20 ENCOUNTER — Encounter: Payer: Self-pay | Admitting: Unknown Physician Specialty

## 2023-07-20 ENCOUNTER — Ambulatory Visit
Admission: RE | Admit: 2023-07-20 | Discharge: 2023-07-20 | Disposition: A | Discharge status: Home / Self Care | Payer: Medicare Other | Attending: Unknown Physician Specialty | Admitting: Unknown Physician Specialty

## 2023-07-20 DIAGNOSIS — J3801 Paralysis of vocal cords and larynx, unilateral: Secondary | ICD-10-CM | POA: Insufficient documentation

## 2023-07-20 DIAGNOSIS — J439 Emphysema, unspecified: Secondary | ICD-10-CM | POA: Insufficient documentation

## 2023-07-20 DIAGNOSIS — Z955 Presence of coronary angioplasty implant and graft: Secondary | ICD-10-CM | POA: Diagnosis not present

## 2023-07-20 DIAGNOSIS — I2510 Atherosclerotic heart disease of native coronary artery without angina pectoris: Secondary | ICD-10-CM | POA: Diagnosis not present

## 2023-07-20 DIAGNOSIS — I714 Abdominal aortic aneurysm, without rupture, unspecified: Secondary | ICD-10-CM | POA: Insufficient documentation

## 2023-07-20 DIAGNOSIS — I252 Old myocardial infarction: Secondary | ICD-10-CM | POA: Insufficient documentation

## 2023-07-20 DIAGNOSIS — C320 Malignant neoplasm of glottis: Secondary | ICD-10-CM

## 2023-07-20 DIAGNOSIS — D380 Neoplasm of uncertain behavior of larynx: Secondary | ICD-10-CM | POA: Diagnosis not present

## 2023-07-20 DIAGNOSIS — I10 Essential (primary) hypertension: Secondary | ICD-10-CM | POA: Diagnosis not present

## 2023-07-20 DIAGNOSIS — Z01810 Encounter for preprocedural cardiovascular examination: Secondary | ICD-10-CM | POA: Diagnosis not present

## 2023-07-20 HISTORY — DX: Unspecified osteoarthritis, unspecified site: M19.90

## 2023-07-20 HISTORY — DX: Nonrheumatic mitral (valve) insufficiency: I34.0

## 2023-07-20 HISTORY — DX: Malignant neoplasm of glottis: C32.0

## 2023-07-20 HISTORY — PX: MICROLARYNGOSCOPY: SHX5208

## 2023-07-20 HISTORY — DX: Old myocardial infarction: I25.2

## 2023-07-20 HISTORY — DX: Personal history of other diseases of the circulatory system: Z98.890

## 2023-07-20 HISTORY — DX: Other ill-defined heart diseases: I51.89

## 2023-07-20 HISTORY — DX: Personal history of other diseases of the circulatory system: Z86.79

## 2023-07-20 SURGERY — MICROLARYNGOSCOPY
Anesthesia: General | Site: Throat | Laterality: Bilateral

## 2023-07-20 MED ORDER — PROPOFOL 10 MG/ML IV BOLUS
INTRAVENOUS | Status: AC
  Filled 2023-07-20: qty 20

## 2023-07-20 MED ORDER — FENTANYL CITRATE (PF) 100 MCG/2ML IJ SOLN
INTRAMUSCULAR | Status: DC | PRN
Start: 2023-07-20 — End: 2023-07-20
  Administered 2023-07-20 (×2): 50 ug via INTRAVENOUS

## 2023-07-20 MED ORDER — LACTATED RINGERS IV SOLN: 10.0000 mL/h | INTRAVENOUS | Status: DC

## 2023-07-20 MED ORDER — OXYCODONE HCL 5 MG PO TABS: 5.0000 mg | ORAL_TABLET | Freq: Once | ORAL | Status: DC | PRN

## 2023-07-20 MED ORDER — ROCURONIUM BROMIDE 10 MG/ML (PF) SYRINGE
PREFILLED_SYRINGE | INTRAVENOUS | Status: AC
  Filled 2023-07-20: qty 10

## 2023-07-20 MED ORDER — DEXAMETHASONE SODIUM PHOSPHATE 10 MG/ML IJ SOLN
INTRAMUSCULAR | Status: DC | PRN
Start: 2023-07-20 — End: 2023-07-20
  Administered 2023-07-20: 5 mg via INTRAVENOUS

## 2023-07-20 MED ORDER — ONDANSETRON HCL 4 MG/2ML IJ SOLN: 4.0000 mg | Freq: Once | INTRAMUSCULAR | Status: DC | PRN

## 2023-07-20 MED ORDER — LIDOCAINE-EPINEPHRINE 1 %-1:100000 IJ SOLN
INTRAMUSCULAR | Status: AC
  Filled 2023-07-20: qty 1

## 2023-07-20 MED ORDER — LIDOCAINE HCL (PF) 2 % IJ SOLN
INTRAMUSCULAR | Status: AC
  Filled 2023-07-20: qty 5

## 2023-07-20 MED ORDER — DEXAMETHASONE SODIUM PHOSPHATE 10 MG/ML IJ SOLN
INTRAMUSCULAR | Status: AC
  Filled 2023-07-20: qty 1

## 2023-07-20 MED ORDER — 0.9 % SODIUM CHLORIDE (POUR BTL) OPTIME
TOPICAL | Status: DC | PRN
  Administered 2023-07-20: 500 mL

## 2023-07-20 MED ORDER — LIDOCAINE HCL (PF) 4 % IJ SOLN
INTRAMUSCULAR | Status: AC
  Filled 2023-07-20: qty 10

## 2023-07-20 MED ORDER — ONDANSETRON HCL 4 MG/2ML IJ SOLN
INTRAMUSCULAR | Status: DC | PRN
Start: 2023-07-20 — End: 2023-07-20
  Administered 2023-07-20: 4 mg via INTRAVENOUS

## 2023-07-20 MED ORDER — ACETAMINOPHEN 10 MG/ML IV SOLN: 1000.0000 mg | Freq: Once | INTRAVENOUS | Status: DC | PRN

## 2023-07-20 MED ORDER — SUGAMMADEX SODIUM 200 MG/2ML IV SOLN
INTRAVENOUS | Status: DC | PRN
Start: 2023-07-20 — End: 2023-07-20
  Administered 2023-07-20: 200 mg via INTRAVENOUS

## 2023-07-20 MED ORDER — OXYCODONE HCL 5 MG/5ML PO SOLN: 5.0000 mg | Freq: Once | ORAL | Status: DC | PRN

## 2023-07-20 MED ORDER — PHENYLEPHRINE HCL 10 % OP SOLN
OPHTHALMIC | Status: DC | PRN
  Administered 2023-07-20: 10 mL via TOPICAL

## 2023-07-20 MED ORDER — OXYMETAZOLINE HCL 0.05 % NA SOLN
NASAL | Status: AC
  Filled 2023-07-20: qty 30

## 2023-07-20 MED ORDER — ONDANSETRON HCL 4 MG/2ML IJ SOLN
INTRAMUSCULAR | Status: AC
  Filled 2023-07-20: qty 2

## 2023-07-20 MED ORDER — FENTANYL CITRATE (PF) 100 MCG/2ML IJ SOLN
INTRAMUSCULAR | Status: AC
  Filled 2023-07-20: qty 2

## 2023-07-20 MED ORDER — FENTANYL CITRATE (PF) 100 MCG/2ML IJ SOLN: 25.0000 ug | INTRAMUSCULAR | Status: DC | PRN

## 2023-07-20 MED ORDER — LIDOCAINE HCL (PF) 2 % IJ SOLN
INTRAMUSCULAR | Status: DC | PRN
Start: 2023-07-20 — End: 2023-07-20
  Administered 2023-07-20: 60 mg via INTRADERMAL

## 2023-07-20 MED ORDER — ROCURONIUM BROMIDE 100 MG/10ML IV SOLN
INTRAVENOUS | Status: DC | PRN
Start: 2023-07-20 — End: 2023-07-20
  Administered 2023-07-20: 40 mg via INTRAVENOUS

## 2023-07-20 MED ORDER — LABETALOL HCL 5 MG/ML IV SOLN
INTRAVENOUS | Status: DC | PRN
Start: 2023-07-20 — End: 2023-07-20
  Administered 2023-07-20: 5 mg via INTRAVENOUS

## 2023-07-20 MED ORDER — PROPOFOL 10 MG/ML IV BOLUS
INTRAVENOUS | Status: DC | PRN
Start: 2023-07-20 — End: 2023-07-20
  Administered 2023-07-20: 200 mg via INTRAVENOUS

## 2023-07-20 SURGICAL SUPPLY — 16 items
BASIN GRAD PLASTIC 32OZ STRL (MISCELLANEOUS) ×2 IMPLANT
CUP MEDICINE 2OZ PLAST GRAD ST (MISCELLANEOUS) ×2 IMPLANT
DEPRESSOR TONGUE 6 IN STERILE (GAUZE/BANDAGES/DRESSINGS) ×4 IMPLANT
DRAPE TABLE BACK 80X90 (DRAPES) ×2 IMPLANT
DRSG TELFA 3X4 N-ADH STERILE (GAUZE/BANDAGES/DRESSINGS) ×2 IMPLANT
GAUZE SPONGE 4X4 12PLY STRL (GAUZE/BANDAGES/DRESSINGS) ×2 IMPLANT
GLOVE BIO SURGEON STRL SZ7.5 (GLOVE) ×2 IMPLANT
GOWN STRL REUS W/ TWL LRG LVL3 (GOWN DISPOSABLE) ×4 IMPLANT
GOWN STRL REUS W/TWL LRG LVL3 (GOWN DISPOSABLE) ×2
LABEL OR SOLS (LABEL) ×2 IMPLANT
MANIFOLD NEPTUNE II (INSTRUMENTS) ×2 IMPLANT
NDL SAFETY ECLIPSE 18X1.5 (NEEDLE) ×2 IMPLANT
PATTIES SURGICAL .5 X.5 (GAUZE/BANDAGES/DRESSINGS) ×2 IMPLANT
TRAP FLUID SMOKE EVACUATOR (MISCELLANEOUS) ×2 IMPLANT
TUBING CONNECTING 10 (TUBING) ×2 IMPLANT
WATER STERILE IRR 500ML POUR (IV SOLUTION) ×2 IMPLANT

## 2023-07-20 NOTE — Transfer of Care (Signed)
Immediate Anesthesia Transfer of Care Note  Patient: TIWAN SCHNITKER  Procedure(s) Performed: MICRODIRECT LARYNGOSCOPY WITH BIOPSY OF VOCAL CORD LESION (Bilateral: Throat)  Patient Location: PACU  Anesthesia Type:General  Level of Consciousness: awake and alert   Airway & Oxygen Therapy: Patient Spontanous Breathing and Patient connected to face mask oxygen  Post-op Assessment: Report given to RN and Post -op Vital signs reviewed and stable  Post vital signs: Reviewed and stable  Last Vitals:  Vitals Value Taken Time  BP 170/71 07/20/23 0806  Temp    Pulse 71 07/20/23 0808  Resp 13 07/20/23 0808  SpO2 99 % 07/20/23 0808  Vitals shown include unfiled device data.  Last Pain:  Vitals:   07/20/23 0619  TempSrc: Oral  PainSc: 0-No pain         Complications: No notable events documented.

## 2023-07-20 NOTE — Anesthesia Procedure Notes (Signed)
Procedure Name: Intubation Date/Time: 07/20/2023 7:32 AM  Performed by: Monico Hoar, CRNAPre-anesthesia Checklist: Patient identified, Patient being monitored, Timeout performed, Emergency Drugs available and Suction available Patient Re-evaluated:Patient Re-evaluated prior to induction Oxygen Delivery Method: Circle system utilized Preoxygenation: Pre-oxygenation with 100% oxygen Induction Type: IV induction Ventilation: Mask ventilation without difficulty Laryngoscope Size: Mac and 4 Grade View: Grade I Tube type: Oral Tube size: 6.0 mm Number of attempts: 1 Airway Equipment and Method: Stylet Placement Confirmation: ETT inserted through vocal cords under direct vision, positive ETCO2 and breath sounds checked- equal and bilateral Secured at: 24 cm Tube secured with: Tape Dental Injury: Teeth and Oropharynx as per pre-operative assessment

## 2023-07-20 NOTE — Anesthesia Postprocedure Evaluation (Signed)
Anesthesia Post Note  Patient: Bobby Sandoval  Procedure(s) Performed: MICRODIRECT LARYNGOSCOPY WITH BIOPSY OF VOCAL CORD LESION (Bilateral: Throat)  Patient location during evaluation: PACU Anesthesia Type: General Level of consciousness: awake and alert Pain management: pain level controlled Vital Signs Assessment: post-procedure vital signs reviewed and stable Respiratory status: spontaneous breathing, nonlabored ventilation, respiratory function stable and patient connected to nasal cannula oxygen Cardiovascular status: blood pressure returned to baseline and stable Postop Assessment: no apparent nausea or vomiting Anesthetic complications: no   No notable events documented.   Last Vitals:  Vitals:   07/20/23 0806 07/20/23 0815  BP: (!) 170/71 (!) 163/58  Pulse: 66 65  Resp: 13 13  Temp: (!) 36.3 C   SpO2: 99% 99%    Last Pain:  Vitals:   07/20/23 0806  TempSrc:   PainSc: 0-No pain                 Corinda Gubler

## 2023-07-20 NOTE — Op Note (Signed)
07/20/2023  8:05 AM    Bobby Sandoval  [id number]   Pre-Op Dx: Neoplasm uncertain behavior - Larynx, Paralysis vocal folds unilateral / complete  Post-op Dx: SAME  Proc: MicroDirect laryngoscopy with biopsy right vocal cord lesion  Surg:  Davina Poke  Anes:  GOT  EBL: Less than 5 cc  Comp: None  Findings: Exophytic lesion right vocal fold  Procedure: Bobby Sandoval was identified in the holding area taken the operating placed in supine position.  After general endotracheal anesthesia the table was turned 90 degrees.  The patient is a dentulous a 4 x 4 was placed against the maxillary crest.  A Dedo laryngoscope was then introduced into the airway and suspended.  Examination larynx showed an obvious lesion of the right anterior vocal fold.  A 0 degree endoscope was introduced into the airway photodocumentation was performed.  The operating microscope was then brought in the field.  Examination of the larynx confirmed this right anterior vocal fold lesion.  Cottonoid pledget with phenylephrine lidocaine solution was used to bathe the larynx.  Multiple biopsies were taken of this right anterior vocal cord lesion.  With the biopsies completed the cottonoid pledgets were replaced and by the larynx.  After 5 minutes the pledgets were removed.  The larynx was suctioned and photodocumentation was performed again using the Hopkins rod.  With no significant obstruction no significant bleeding the patient was returned to anesthesia where he was awakened in the operating type open room in stable condition.  Specimen: Right anterior vocal cord lesion  Dispo:   Good  Plan: Discharge to home follow-up 10 days  Davina Poke  07/20/2023 8:05 AM

## 2023-07-20 NOTE — Progress Notes (Signed)
Pt tolerating PO liquids.

## 2023-07-20 NOTE — H&P (Signed)
The patient's history has been reviewed, patient examined, no change in status, stable for surgery.  Questions were answered to the patients satisfaction.

## 2023-07-20 NOTE — Discharge Instructions (Signed)

## 2023-07-21 ENCOUNTER — Encounter: Payer: Self-pay | Admitting: Unknown Physician Specialty

## 2023-07-21 ENCOUNTER — Other Ambulatory Visit: Payer: Self-pay | Admitting: Unknown Physician Specialty

## 2023-07-21 DIAGNOSIS — C320 Malignant neoplasm of glottis: Secondary | ICD-10-CM

## 2023-07-21 LAB — SURGICAL PATHOLOGY

## 2023-07-26 ENCOUNTER — Encounter: Payer: Self-pay | Admitting: Oncology

## 2023-07-26 ENCOUNTER — Inpatient Hospital Stay: Payer: Medicare Other

## 2023-07-26 ENCOUNTER — Inpatient Hospital Stay: Payer: Medicare Other | Attending: Oncology | Admitting: Oncology

## 2023-07-26 VITALS — BP 164/74 | HR 65 | Temp 99.2°F | Resp 18 | Ht 73.0 in | Wt 211.0 lb

## 2023-07-26 DIAGNOSIS — N429 Disorder of prostate, unspecified: Secondary | ICD-10-CM | POA: Diagnosis not present

## 2023-07-26 DIAGNOSIS — Z87891 Personal history of nicotine dependence: Secondary | ICD-10-CM | POA: Insufficient documentation

## 2023-07-26 DIAGNOSIS — C320 Malignant neoplasm of glottis: Secondary | ICD-10-CM | POA: Insufficient documentation

## 2023-07-26 DIAGNOSIS — C760 Malignant neoplasm of head, face and neck: Secondary | ICD-10-CM

## 2023-07-26 NOTE — Progress Notes (Unsigned)
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  INTERVAL HISTORY: Patient is a 77 year old male who recently developed hoarseness of voice.  Subsequent imaging and evaluation revealed the above-stated malignancy.  He otherwise feels well.  He has no neurologic complaints.  He denies any recent fevers or illnesses.  He has a good appetite and denies weight loss.  He has no chest pain, shortness of breath, cough, or hemoptysis.  He denies any nausea, vomiting, constipation, or diarrhea.  He has no urinary complaints.  Patient offers no further specific complaints today.  REVIEW OF SYSTEMS:   Review of Systems  Constitutional: Negative.  Negative for fever, malaise/fatigue and weight loss.  Respiratory: Negative.  Negative for cough, hemoptysis and shortness of breath.   Cardiovascular: Negative.  Negative for chest pain and leg swelling.  Gastrointestinal: Negative.  Negative for abdominal pain.  Genitourinary: Negative.  Negative for dysuria.  Musculoskeletal: Negative.  Negative for back pain.  Skin: Negative.  Negative for rash.  Neurological: Negative.  Negative for dizziness, focal weakness, weakness and headaches.  Psychiatric/Behavioral: Negative.  The patient is not nervous/anxious.     As per HPI. Otherwise, a complete review of systems is negative.  PAST MEDICAL HISTORY: Past Medical History:  Diagnosis Date   AAA (abdominal aortic aneurysm) (HCC)    a. 2012 3.4 cm; b. 07/2017 Abd u/s: Dist abd Ao dil up to 4.8cm; c. 03/2018 CT Abd: infrarenal AAA 5.3 x 5.7cm; d. 06/2018 EVAR (28mm prox/11mm dist, 18cm length Gore Excluder Endoprosthesis).   Arthritis    CAD (coronary artery  disease)    a. 2007 Inf MI w/ BMS to RCA x 2; b. 2008 PCI/DES to mid LAD @ [facility]; c. 07/2015 Cath: LAD 100 prox to prev placed stent w/ R->L collats, RCA stents patent. EF 50-55%. Med Rx; d. 04/2018 MV: EF 48%, mid-dist ant/apical/apical-lat ischemia->med rx.   Cataracts, bilateral    COPD (chronic obstructive pulmonary disease) (HCC)    Diastolic dysfunction    a. 02/2014 Echo: EF 55-65%. Gr1 DD, no rwma, mildly dil LA, nl RV fxn; b. 01/2018 Echo: EF 60-65%, Gr1 DD, mild MR, mildly dil LA. nl RV fxn. Nl PASP.   ETOH abuse    Grade I diastolic dysfunction    Hepatic steatosis    a. 03/2018 CT Abd: hepatic steatosis and possible early cirrhotic morphology of liver.   History of heart attack    History of kidney stones    Hyperlipidemia    Hypertension    MI (myocardial infarction) (HCC)    Mild mitral regurgitation by prior echocardiogram    Obesity    Panic disorder    Panic disorder    Status post AAA (abdominal aortic aneurysm) repair    Tobacco abuse     PAST SURGICAL HISTORY: Past Surgical History:  Procedure Laterality Date   25 GAUGE PARS PLANA VITRECTOMY WITH 20 GAUGE MVR PORT FOR MACULAR HOLE Right 01/14/2021   Procedure: 25 GAUGE PARS PLANA VITRECTOMY WITH 20 GAUGE MVR PORT FOR MACULAR HOLE;  Surgeon: Sherrie George, MD;  Location: [facility] OR;  Service: Ophthalmology;  Laterality: Right;   AIR/FLUID EXCHANGE Right 01/14/2021   Procedure: AIR/FLUID EXCHANGE;  Surgeon: Sherrie George, MD;  Location: [facility] OR;  Service: Ophthalmology;  Laterality: Right;   BACK SURGERY     CARDIAC CATHETERIZATION  08/2006   x1 stent [facility]. Mid RCA: 3.5 x 16 mm overlap with 3.5 x 24 mm Liberte bare-metal stents   CARDIAC CATHETERIZATION  10/2006   x1 stent [facility]: Mid LAD: 2.5 x 28 mm Cypher drug-eluting   CARDIAC CATHETERIZATION N/A 07/25/2015   Procedure: Left Heart Cath and Coronary Angiography;  Surgeon: Iran Ouch, MD;  Location: [facility] INVASIVE CV LAB;  Service: Cardiovascular;  Laterality: N/A;    Cataract surgery Bilateral    COLONOSCOPY     coronary stents     ENDOVASCULAR REPAIR/STENT GRAFT N/A 06/08/2018   Procedure: ENDOVASCULAR REPAIR/STENT GRAFT;  Surgeon: Annice Needy, MD;  Location: [facility] INVASIVE CV LAB;  Service: Cardiovascular;  Laterality: N/A;   GAS INSERTION Right 01/14/2021   Procedure: INSERTION OF GAS;  Surgeon: Sherrie George, MD;  Location: [facility] OR;  Service: Ophthalmology;  Laterality: Right;   GAS/FLUID EXCHANGE Right 01/14/2021   Procedure: GAS/FLUID EXCHANGE;  Surgeon: Sherrie George, MD;  Location: [facility] OR;  Service: Ophthalmology;  Laterality: Right;   MEMBRANE PEEL Right 01/14/2021   Procedure: MEMBRANE PEEL;  Surgeon: Sherrie George, MD;  Location: [facility] OR;  Service: Ophthalmology;  Laterality: Right;   MICRODISCECTOMY LUMBAR     MICROLARYNGOSCOPY Bilateral 07/20/2023   Procedure: MICRODIRECT LARYNGOSCOPY WITH BIOPSY OF VOCAL CORD LESION;  Surgeon: Linus Salmons, MD;  Location: [facility] ORS;  Service: ENT;  Laterality: Bilateral;   PHOTOCOAGULATION WITH LASER Right 01/14/2021   Procedure: PHOTOCOAGULATION WITH LASER;  Surgeon: Sherrie George, MD;  Location: [facility] OR;  Service: Ophthalmology;  Laterality: Right;   SERUM PATCH Right 01/14/2021   Procedure: SERUM PATCH;  Surgeon: Sherrie George, MD;  Location: [facility] OR;  Service: Ophthalmology;  Laterality: Right;    FAMILY HISTORY: Family History  Problem Relation Age of Onset   Hypertension Mother    Heart disease Father     ADVANCED DIRECTIVES (Y/N):  N  HEALTH MAINTENANCE: Social History   Tobacco Use   Smoking status: Former    Current packs/day: 0.00    Average packs/day: 2.0 packs/day for 50.0 years (100.0 ttl pk-yrs)    Types: Cigarettes    Quit date: 07/20/2023    Years since quitting: 0.0   Smokeless tobacco: Never  Vaping Use   Vaping status: Never Used  Substance Use Topics   Alcohol use: Yes    Alcohol/week: 84.0 standard drinks of alcohol    Types: 84 Cans of beer per week     Comment: daily   Drug use: No     Colonoscopy:  PAP:  Bone density:  Lipid panel:  No Known Allergies  Current Outpatient Medications  Medication Sig Dispense Refill   ALPRAZolam (XANAX) 0.5 MG tablet Take 0.5 mg by mouth 4 (four) times daily as needed for anxiety.     aspirin EC 81 MG tablet Take 1 tablet (81 mg total) by mouth daily. 90 tablet 3   atorvastatin (LIPITOR) 40 MG tablet Take 1 tablet (40 mg total) by mouth daily. 30 tablet 1   enalapril (VASOTEC) 20 MG tablet Take 1 tablet (20 mg total) by mouth 2 (two) times daily. 60 tablet 3   nitroGLYCERIN (NITROSTAT) 0.4 MG SL tablet Place 1 tablet (0.4 mg total) under the tongue every 5 (five) minutes as needed for chest pain. 25 tablet 1   torsemide (DEMADEX) 20 MG tablet Take 20 mg  by mouth daily as needed.     Multiple Vitamins-Minerals (PRESERVISION AREDS 2) CAPS Take 1 tablet by mouth 2 (two) times daily. (Patient not taking: Reported on 07/12/2023)     No current facility-administered medications for this visit.    OBJECTIVE: Vitals:   07/26/23 1453  BP: (!) 164/74  Pulse: 65  Resp: 18  Temp: 99.2 F (37.3 C)  SpO2: 97%     Body mass index is 27.84 kg/m.    ECOG FS:0 - Asymptomatic  General: Well-developed, well-nourished, no acute distress. Eyes: Pink conjunctiva, anicteric sclera. HEENT: Normocephalic, moist mucous membranes. Lungs: No audible wheezing or coughing. Heart: Regular rate and rhythm. Abdomen: Soft, nontender, no obvious distention. Musculoskeletal: No edema, cyanosis, or clubbing. Neuro: Alert, answering all questions appropriately. Cranial nerves grossly intact. Skin: No rashes or petechiae noted. Psych: Normal affect. Lymphatics: No cervical, calvicular, axillary or inguinal LAD.   LAB RESULTS:  Lab Results  Component Value Date   NA 134 (L) 01/14/2021   K 4.0 01/14/2021   CL 101 01/14/2021   CO2 26 01/14/2021   GLUCOSE 105 (H) 01/14/2021   BUN 6 (L) 01/14/2021   CREATININE 0.88  01/14/2021   CALCIUM 8.8 (L) 01/14/2021   PROT 6.2 (L) 01/14/2021   ALBUMIN 3.8 01/14/2021   AST 22 01/14/2021   ALT 22 01/14/2021   ALKPHOS 63 01/14/2021   BILITOT 1.2 01/14/2021   GFRNONAA >60 01/14/2021   GFRAA >60 06/09/2018    Lab Results  Component Value Date   WBC 5.7 01/14/2021   NEUTROABS 3.3 01/13/2018   HGB 18.3 (H) 01/14/2021   HCT 54.1 (H) 01/14/2021   MCV 96.6 01/14/2021   PLT 166 01/14/2021     STUDIES: No results found.  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  He does not appear to have any lymphadenopathy in his neck, but has a PET scan scheduled for next week to complete the staging workup.  Patient will follow-up 2 to 3 days after his PET scan for discussion of the results and treatment planning.  He will also have consultation with radiation oncology on that day.  I spent a total of 60 minutes reviewing chart data, face-to-face evaluation with the patient, counseling and coordination of care as detailed above.   Patient expressed understanding and was in agreement with this plan. He also understands that He can call clinic at any time with any questions, concerns, or complaints.    Cancer Staging  No matching staging information was found for the patient.   Jeralyn Ruths, MD   07/27/2023 4:18 PM

## 2023-08-02 ENCOUNTER — Other Ambulatory Visit: Payer: Self-pay | Admitting: Oncology

## 2023-08-02 ENCOUNTER — Ambulatory Visit
Admission: RE | Admit: 2023-08-02 | Discharge: 2023-08-02 | Disposition: A | Discharge status: Home / Self Care | Payer: Medicare Other | Source: Ambulatory Visit | Attending: Unknown Physician Specialty | Admitting: Unknown Physician Specialty

## 2023-08-02 DIAGNOSIS — N200 Calculus of kidney: Secondary | ICD-10-CM | POA: Insufficient documentation

## 2023-08-02 DIAGNOSIS — I6523 Occlusion and stenosis of bilateral carotid arteries: Secondary | ICD-10-CM | POA: Diagnosis not present

## 2023-08-02 DIAGNOSIS — K8020 Calculus of gallbladder without cholecystitis without obstruction: Secondary | ICD-10-CM | POA: Insufficient documentation

## 2023-08-02 DIAGNOSIS — C320 Malignant neoplasm of glottis: Secondary | ICD-10-CM | POA: Insufficient documentation

## 2023-08-02 DIAGNOSIS — C329 Malignant neoplasm of larynx, unspecified: Secondary | ICD-10-CM | POA: Diagnosis not present

## 2023-08-02 DIAGNOSIS — I714 Abdominal aortic aneurysm, without rupture, unspecified: Secondary | ICD-10-CM | POA: Insufficient documentation

## 2023-08-02 DIAGNOSIS — R490 Dysphonia: Secondary | ICD-10-CM | POA: Diagnosis not present

## 2023-08-02 LAB — GLUCOSE, CAPILLARY: Glucose-Capillary: 89 mg/dL (ref 70–99)

## 2023-08-02 MED ORDER — FLUDEOXYGLUCOSE F - 18 (FDG) INJECTION
11.1000 | Freq: Once | INTRAVENOUS | Status: AC | PRN
  Administered 2023-08-02: 11.1 via INTRAVENOUS

## 2023-08-05 ENCOUNTER — Other Ambulatory Visit: Payer: Medicare Other

## 2023-08-05 ENCOUNTER — Encounter: Payer: Self-pay | Admitting: Oncology

## 2023-08-05 ENCOUNTER — Inpatient Hospital Stay (HOSPITAL_BASED_OUTPATIENT_CLINIC_OR_DEPARTMENT_OTHER): Payer: Medicare Other | Admitting: Oncology

## 2023-08-05 ENCOUNTER — Inpatient Hospital Stay: Payer: Medicare Other

## 2023-08-05 ENCOUNTER — Ambulatory Visit
Admission: RE | Admit: 2023-08-05 | Discharge: 2023-08-05 | Disposition: A | Discharge status: Home / Self Care | Payer: Medicare Other | Source: Ambulatory Visit | Attending: Radiation Oncology | Admitting: Radiation Oncology

## 2023-08-05 VITALS — BP 169/67 | HR 63 | Temp 97.7°F | Resp 16 | Ht 73.0 in | Wt 214.0 lb

## 2023-08-05 DIAGNOSIS — N402 Nodular prostate without lower urinary tract symptoms: Secondary | ICD-10-CM | POA: Diagnosis not present

## 2023-08-05 DIAGNOSIS — Z87442 Personal history of urinary calculi: Secondary | ICD-10-CM | POA: Diagnosis not present

## 2023-08-05 DIAGNOSIS — J449 Chronic obstructive pulmonary disease, unspecified: Secondary | ICD-10-CM | POA: Diagnosis not present

## 2023-08-05 DIAGNOSIS — N429 Disorder of prostate, unspecified: Secondary | ICD-10-CM | POA: Diagnosis not present

## 2023-08-05 DIAGNOSIS — E669 Obesity, unspecified: Secondary | ICD-10-CM | POA: Insufficient documentation

## 2023-08-05 DIAGNOSIS — F1010 Alcohol abuse, uncomplicated: Secondary | ICD-10-CM | POA: Insufficient documentation

## 2023-08-05 DIAGNOSIS — Z7982 Long term (current) use of aspirin: Secondary | ICD-10-CM | POA: Insufficient documentation

## 2023-08-05 DIAGNOSIS — C760 Malignant neoplasm of head, face and neck: Secondary | ICD-10-CM | POA: Diagnosis not present

## 2023-08-05 DIAGNOSIS — M129 Arthropathy, unspecified: Secondary | ICD-10-CM | POA: Insufficient documentation

## 2023-08-05 DIAGNOSIS — C320 Malignant neoplasm of glottis: Secondary | ICD-10-CM

## 2023-08-05 DIAGNOSIS — I10 Essential (primary) hypertension: Secondary | ICD-10-CM | POA: Insufficient documentation

## 2023-08-05 DIAGNOSIS — Z87891 Personal history of nicotine dependence: Secondary | ICD-10-CM | POA: Diagnosis not present

## 2023-08-05 DIAGNOSIS — C329 Malignant neoplasm of larynx, unspecified: Secondary | ICD-10-CM | POA: Insufficient documentation

## 2023-08-05 DIAGNOSIS — I2510 Atherosclerotic heart disease of native coronary artery without angina pectoris: Secondary | ICD-10-CM | POA: Diagnosis not present

## 2023-08-05 DIAGNOSIS — Z79899 Other long term (current) drug therapy: Secondary | ICD-10-CM | POA: Diagnosis not present

## 2023-08-05 DIAGNOSIS — E785 Hyperlipidemia, unspecified: Secondary | ICD-10-CM | POA: Insufficient documentation

## 2023-08-05 DIAGNOSIS — I252 Old myocardial infarction: Secondary | ICD-10-CM | POA: Insufficient documentation

## 2023-08-05 DIAGNOSIS — I714 Abdominal aortic aneurysm, without rupture, unspecified: Secondary | ICD-10-CM | POA: Insufficient documentation

## 2023-08-05 DIAGNOSIS — K760 Fatty (change of) liver, not elsewhere classified: Secondary | ICD-10-CM | POA: Insufficient documentation

## 2023-08-05 LAB — PSA: Prostatic Specific Antigen: 3.73 ng/mL (ref 0.00–4.00)

## 2023-08-05 NOTE — Consult Note (Signed)
NEW PATIENT EVALUATION  Name: Bobby Sandoval  MRN: [id number]  Date:   08/05/2023     DOB: 1946-01-08   This 77 y.o. 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   PREVIOUS INVESTIGATIONS:  PET/CT CT scans reviewed Pathology report reviewed Clinical notes reviewed Case presented at weekly tumor conference  HPI: Patient is a 77 year old male who presented with several month history of hoarseness.  He was seen by ENT and found to have a right true cord lesion with fixation of the right true cord.  CT scan dreamt demonstrated apparent asymmetric enhancement of the right vocal cord with enhancement and mucosal thickening extending superior to the right preepiglottic fat suspicious for neoplasm.  No pathologic adenopathy neck was noted.  He had biopsy performed by ENT showing at least superficial invasive moderately differentiated squamous carcinoma with focal keratinization.  He has undergone a PET CT scan showing like marked hypermetabolic activity with the right laryngeal mass associated with the right true cord.  No evidence of subglottic extension metastatic adenopathy in the head and neck region.  Did have incidentally focal marked hypermetabolic activity in the periphery of the prostate gland.  His PSA recently was performed was in the 2 range.  He has been seen by medical oncology is now referred to radiation oncology for consideration of treatment.  PLANNED TREATMENT REGIMEN: Possible concurrent chemoradiation  PAST MEDICAL HISTORY:  has a past medical history of AAA (abdominal aortic aneurysm) (HCC), Arthritis, CAD (coronary artery disease), Cataracts, bilateral, COPD (chronic  obstructive pulmonary disease) (HCC), Diastolic dysfunction, ETOH abuse, Grade I diastolic dysfunction, Hepatic steatosis, History of heart attack, History of kidney stones, Hyperlipidemia, Hypertension, MI (myocardial infarction) (HCC), Mild mitral regurgitation by prior echocardiogram, Obesity, Panic disorder, Panic disorder, Status post AAA (abdominal aortic aneurysm) repair, and Tobacco abuse.    PAST SURGICAL HISTORY:  Past Surgical History:  Procedure Laterality Date   25 GAUGE PARS PLANA VITRECTOMY WITH 20 GAUGE MVR PORT FOR MACULAR HOLE Right 01/14/2021   Procedure: 25 GAUGE PARS PLANA VITRECTOMY WITH 20 GAUGE MVR PORT FOR MACULAR HOLE;  Surgeon: Sherrie George, MD;  Location: [facility] OR;  Service: Ophthalmology;  Laterality: Right;   AIR/FLUID EXCHANGE Right 01/14/2021   Procedure: AIR/FLUID EXCHANGE;  Surgeon: Sherrie George, MD;  Location[facility] OR;  Service: Ophthalmology;  Laterality: Right;   BACK SURGERY     CARDIAC CATHETERIZATION  08/2006   x1 stent MC. Mid RCA: 3.5 x 16 mm overlap with 3.5 x 24 mm Liberte bare-metal stents   CARDIAC CATHETERIZATION  10/2006   x1 stent VA: Mid LAD: 2.5 x 28 mm Cypher drug-eluting   CARDIAC CATHETERIZATION N/A 07/25/2015   Procedure: Left Heart Cath and Coronary Angiography;  Surgeon: Iran Ouch, MD;  Location: [facility] INVASIVE CV LAB;  Service: Cardiovascular;  Laterality: N/A;   Cataract surgery Bilateral    COLONOSCOPY     coronary stents     ENDOVASCULAR REPAIR/STENT GRAFT N/A 06/08/2018   Procedure: ENDOVASCULAR REPAIR/STENT GRAFT;  Surgeon: Annice Needy, MD;  Location: [facility] INVASIVE CV LAB;  Service: Cardiovascular;  Laterality: N/A;   GAS INSERTION Right 01/14/2021   Procedure: INSERTION OF GAS;  Surgeon: Ashley Royalty,  Beulah Gandy, MD;  Location: [facility] OR;  Service: Ophthalmology;  Laterality: Right;   GAS/FLUID EXCHANGE Right 01/14/2021   Procedure: GAS/FLUID EXCHANGE;  Surgeon: Sherrie George, MD;  Location: [facility] OR;  Service: Ophthalmology;   Laterality: Right;   MEMBRANE PEEL Right 01/14/2021   Procedure: MEMBRANE PEEL;  Surgeon: Sherrie George, MD;  Location: [facility] OR;  Service: Ophthalmology;  Laterality: Right;   MICRODISCECTOMY LUMBAR     MICROLARYNGOSCOPY Bilateral 07/20/2023   Procedure: MICRODIRECT LARYNGOSCOPY WITH BIOPSY OF VOCAL CORD LESION;  Surgeon: Linus Salmons, MD;  Location: [facility] ORS;  Service: ENT;  Laterality: Bilateral;   PHOTOCOAGULATION WITH LASER Right 01/14/2021   Procedure: PHOTOCOAGULATION WITH LASER;  Surgeon: Sherrie George, MD;  Location: [facility] OR;  Service: Ophthalmology;  Laterality: Right;   SERUM PATCH Right 01/14/2021   Procedure: SERUM PATCH;  Surgeon: Sherrie George, MD;  Location: [facility] OR;  Service: Ophthalmology;  Laterality: Right;    FAMILY HISTORY: family history includes Heart disease in his father; Hypertension in his mother.  SOCIAL HISTORY:  reports that he quit smoking about 2 weeks ago. His smoking use included cigarettes. He has a 100 pack-year smoking history. He has never used smokeless tobacco. He reports current alcohol use of about 84.0 standard drinks of alcohol per week. He reports that he does not use drugs.  ALLERGIES: Patient has no known allergies.  MEDICATIONS:  Current Outpatient Medications  Medication Sig Dispense Refill   ALPRAZolam (XANAX) 0.5 MG tablet Take 0.5 mg by mouth 4 (four) times daily as needed for anxiety.     aspirin EC 81 MG tablet Take 1 tablet (81 mg total) by mouth daily. 90 tablet 3   atorvastatin (LIPITOR) 40 MG tablet Take 1 tablet (40 mg total) by mouth daily. 30 tablet 1   enalapril (VASOTEC) 20 MG tablet Take 1 tablet (20 mg total) by mouth 2 (two) times daily. 60 tablet 3   Multiple Vitamins-Minerals (PRESERVISION AREDS 2) CAPS Take 1 tablet by mouth 2 (two) times daily. (Patient not taking: Reported on 07/12/2023)     nitroGLYCERIN (NITROSTAT) 0.4 MG SL tablet Place 1 tablet (0.4 mg total) under the tongue every 5 (five) minutes as needed  for chest pain. 25 tablet 1   torsemide (DEMADEX) 20 MG tablet Take 20 mg by mouth daily as needed.     No current facility-administered medications for this encounter.    ECOG PERFORMANCE STATUS:  1 - Symptomatic but completely ambulatory  REVIEW OF SYSTEMS: Patient denies any weight loss, fatigue, weakness, fever, chills or night sweats. Patient denies any loss of vision, blurred vision. Patient denies any ringing  of the ears or hearing loss. No irregular heartbeat. Patient denies heart murmur or history of fainting. Patient denies any chest pain or pain radiating to her upper extremities. Patient denies any shortness of breath, difficulty breathing at night, cough or hemoptysis. Patient denies any swelling in the lower legs. Patient denies any nausea vomiting, vomiting of blood, or coffee ground material in the vomitus. Patient denies any stomach pain. Patient states has had normal bowel movements no significant constipation or diarrhea. Patient denies any dysuria, hematuria or significant nocturia. Patient denies any problems walking, swelling in the joints or loss of balance. Patient denies any skin changes, loss of hair or loss of weight. Patient denies any excessive worrying or anxiety or significant depression. Patient denies any problems with insomnia. Patient denies excessive thirst, polyuria, polydipsia. Patient denies any swollen glands, patient denies easy bruising  or easy bleeding. Patient denies any recent infections, allergies or URI. Patient "s visual fields have not changed significantly in recent time.   PHYSICAL EXAM: There were no vitals taken for this visit. Oral cavity is clear no oral mucosal lesions are identified.  Neck is clear without evidence of cervical or supraclavicular adenopathy.  Well-developed well-nourished patient in NAD. HEENT reveals PERLA, EOMI, discs not visualized.  Oral cavity is clear. No oral mucosal lesions are identified. Neck is clear without evidence of  cervical or supraclavicular adenopathy. Lungs are clear to A&P. Cardiac examination is essentially unremarkable with regular rate and rhythm without murmur rub or thrill. Abdomen is benign with no organomegaly or masses noted. Motor sensory and DTR levels are equal and symmetric in the upper and lower extremities. Cranial nerves II through XII are grossly intact. Proprioception is intact. No peripheral adenopathy or edema is identified. No motor or sensory levels are noted. Crude visual fields are within normal range.  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  For stage III disease concurrent chemoradiation has been recommended recommended.  Patient is close to 77 years old we will discuss possibility of treating slowly with radiation therapy.  Risks and benefits of treatment including possible sore throat possible radiation esophagitis skin reaction fatigue alteration of blood counts all were reviewed in detail with the patient.  I personally set up and ordered CT simulation for next week.  Should we decide on concurrent treatment will coordinate that with medical oncology.  There will be extra effort by both professional staff as well as technical staff to coordinate and manage concurrent chemoradiation and ensuing side effects during his treatments. Patient and family comprehend my treatment plan well.  I would like to take this opportunity to thank you for allowing me to participate in the care of your patient.Carmina Miller, MD

## 2023-08-06 ENCOUNTER — Encounter: Payer: Self-pay | Admitting: Oncology

## 2023-08-06 DIAGNOSIS — C320 Malignant neoplasm of glottis: Secondary | ICD-10-CM | POA: Insufficient documentation

## 2023-08-06 MED ORDER — ONDANSETRON HCL 8 MG PO TABS: 8.0000 mg | ORAL_TABLET | Freq: Three times a day (TID) | ORAL | 1 refills | Status: AC | PRN

## 2023-08-06 MED ORDER — PROCHLORPERAZINE MALEATE 10 MG PO TABS: 10.0000 mg | ORAL_TABLET | Freq: Four times a day (QID) | ORAL | 1 refills | Status: AC | PRN

## 2023-08-06 NOTE — Progress Notes (Signed)
START ON PATHWAY REGIMEN - Head and Neck     A cycle is every 7 days:     Cisplatin   **Always confirm dose/schedule in your pharmacy ordering system**  Patient Characteristics: Larynx, Preoperative or Nonsurgical Candidate, Stage III-IVB, Candidate for Definitive ChemoRT Alone Disease Classification: Larynx AJCC T Category: T3 AJCC 8 Stage Grouping: III Therapeutic Status: Preoperative or Nonsurgical Candidate AJCC N Category: cN0 AJCC M Category: M0 Intent of Therapy: Curative Intent, Discussed with Patient

## 2023-08-08 ENCOUNTER — Other Ambulatory Visit: Payer: Self-pay

## 2023-08-09 DIAGNOSIS — C320 Malignant neoplasm of glottis: Secondary | ICD-10-CM | POA: Diagnosis not present

## 2023-08-10 ENCOUNTER — Telehealth: Payer: Self-pay

## 2023-08-10 ENCOUNTER — Other Ambulatory Visit: Payer: Self-pay

## 2023-08-10 NOTE — Telephone Encounter (Signed)
Patient's wife left voicemail stating she felt like they were being left in the dark regarding the outcome of the tumor board meeting. She said when they look in MyChart all they see is appointments and don't understand. I sent a MyChart message because I couldn't get them on the phone or leave a VM. I let them know Dr Orlie Dakin was out of office today and that he would discuss things at upcoming appt but I think they want to know something sooner.

## 2023-08-12 ENCOUNTER — Other Ambulatory Visit: Payer: Self-pay

## 2023-08-12 ENCOUNTER — Other Ambulatory Visit: Payer: Self-pay | Admitting: Radiation Oncology

## 2023-08-12 ENCOUNTER — Encounter: Payer: Self-pay | Admitting: Nurse Practitioner

## 2023-08-12 ENCOUNTER — Ambulatory Visit: Payer: Medicare Other | Attending: Nurse Practitioner | Admitting: Nurse Practitioner

## 2023-08-12 VITALS — BP 148/78 | HR 55 | Ht 73.0 in | Wt 215.2 lb

## 2023-08-12 DIAGNOSIS — I2510 Atherosclerotic heart disease of native coronary artery without angina pectoris: Secondary | ICD-10-CM | POA: Diagnosis not present

## 2023-08-12 DIAGNOSIS — F1010 Alcohol abuse, uncomplicated: Secondary | ICD-10-CM

## 2023-08-12 DIAGNOSIS — Z720 Tobacco use: Secondary | ICD-10-CM | POA: Diagnosis not present

## 2023-08-12 DIAGNOSIS — C801 Malignant (primary) neoplasm, unspecified: Secondary | ICD-10-CM

## 2023-08-12 DIAGNOSIS — I714 Abdominal aortic aneurysm, without rupture, unspecified: Secondary | ICD-10-CM | POA: Diagnosis not present

## 2023-08-12 DIAGNOSIS — I10 Essential (primary) hypertension: Secondary | ICD-10-CM

## 2023-08-12 DIAGNOSIS — I5030 Unspecified diastolic (congestive) heart failure: Secondary | ICD-10-CM

## 2023-08-12 DIAGNOSIS — E785 Hyperlipidemia, unspecified: Secondary | ICD-10-CM

## 2023-08-12 NOTE — Progress Notes (Signed)
Office Visit    Patient Name: Bobby Sandoval Date of Encounter: 08/12/2023  Primary Care Provider:  Gracelyn Nurse, Sandoval Primary Cardiologist:  Bobby Sandoval  Chief Complaint    77 y.o. male with a history of CAD status post prior RCA and LAD stenting, chronic HFpEF, alcohol abuse, COPD, hypertension, hyperlipidemia, tobacco abuse, obesity, abdominal aortic aneurysm status post endograft, and laryngeal cancer, who presents for follow-up related to CAD.  Past Medical History  Subjective   Past Medical History:  Diagnosis Date   AAA (abdominal aortic aneurysm) (HCC)    a. 2012 3.4 cm; b. 07/2017 Abd u/s: Dist abd Ao dil up to 4.8cm; c. 03/2018 CT Abd: infrarenal AAA 5.3 x 5.7cm; d. 06/2018 EVAR (28mm prox/23mm dist, 18cm length Gore Excluder Endoprosthesis).   Arthritis    CAD (coronary artery disease)    a. 2007 Inf MI w/ BMS to RCA x 2; b. 2008 PCI/DES to mid LAD @ [facility]; c. 07/2015 Cath: LAD 100 prox to prev placed stent w/ R->L collats, RCA stents patent. EF 50-55%. Med Rx; d. 04/2018 MV: EF 48%, mid-dist ant/apical/apical-lat ischemia->med rx.   Cataracts, bilateral    COPD (chronic obstructive pulmonary disease) (HCC)    Diastolic dysfunction    a. 02/2014 Echo: EF 55-65%. Gr1 DD, no rwma, mildly dil LA, nl RV fxn; b. 01/2018 Echo: EF 60-65%, Gr1 DD, mild MR, mildly dil LA. nl RV fxn. Nl PASP.   ETOH abuse    Grade I diastolic dysfunction    Hepatic steatosis    a. 03/2018 CT Abd: hepatic steatosis and possible early cirrhotic morphology of liver.   History of heart attack    History of kidney stones    Hyperlipidemia    Hypertension    MI (myocardial infarction) (HCC)    Mild mitral regurgitation by prior echocardiogram    Obesity    Panic disorder    Panic disorder    Status post AAA (abdominal aortic aneurysm) repair    Tobacco abuse    Past Surgical History:  Procedure Laterality Date   25 GAUGE PARS PLANA VITRECTOMY WITH 20 GAUGE MVR PORT FOR MACULAR HOLE  Right 01/14/2021   Procedure: 25 GAUGE PARS PLANA VITRECTOMY WITH 20 GAUGE MVR PORT FOR MACULAR HOLE;  Surgeon: Bobby Sandoval;  Location: [facility] OR;  Service: Ophthalmology;  Laterality: Right;   AIR/FLUID EXCHANGE Right 01/14/2021   Procedure: AIR/FLUID EXCHANGE;  Surgeon: Bobby Sandoval;  Location: [facility] OR;  Service: Ophthalmology;  Laterality: Right;   BACK SURGERY     CARDIAC CATHETERIZATION  08/2006   x1 stent MC. Mid RCA: 3.5 x 16 mm overlap with 3.5 x 24 mm Liberte bare-metal stents   CARDIAC CATHETERIZATION  10/2006   x1 stent VA: Mid LAD: 2.5 x 28 mm Cypher drug-eluting   CARDIAC CATHETERIZATION N/A 07/25/2015   Procedure: Left Heart Cath and Coronary Angiography;  Surgeon: Bobby Sandoval;  Location: [facility] INVASIVE CV LAB;  Service: Cardiovascular;  Laterality: N/A;   Cataract surgery Bilateral    COLONOSCOPY     coronary stents     ENDOVASCULAR REPAIR/STENT GRAFT N/A 06/08/2018   Procedure: ENDOVASCULAR REPAIR/STENT GRAFT;  Surgeon: Bobby Sandoval;  Location: [facility] INVASIVE CV LAB;  Service: Cardiovascular;  Laterality: N/A;   GAS INSERTION Right 01/14/2021   Procedure: INSERTION OF GAS;  Surgeon: Bobby Sandoval;  Location: [facility] OR;  Service: Ophthalmology;  Laterality: Right;   GAS/FLUID EXCHANGE Right  01/14/2021   Procedure: GAS/FLUID EXCHANGE;  Surgeon: Bobby Sandoval;  Location: [facility] OR;  Service: Ophthalmology;  Laterality: Right;   MEMBRANE PEEL Right 01/14/2021   Procedure: MEMBRANE PEEL;  Surgeon: Bobby Sandoval;  Location: [facility] OR;  Service: Ophthalmology;  Laterality: Right;   MICRODISCECTOMY LUMBAR     MICROLARYNGOSCOPY Bilateral 07/20/2023   Procedure: MICRODIRECT LARYNGOSCOPY WITH BIOPSY OF VOCAL CORD LESION;  Surgeon: Bobby Sandoval;  Location: [facility] ORS;  Service: ENT;  Laterality: Bilateral;   PHOTOCOAGULATION WITH LASER Right 01/14/2021   Procedure: PHOTOCOAGULATION WITH LASER;  Surgeon: Bobby Sandoval;  Location: [facility] OR;   Service: Ophthalmology;  Laterality: Right;   SERUM PATCH Right 01/14/2021   Procedure: SERUM PATCH;  Surgeon: Bobby Sandoval;  Location: [facility] OR;  Service: Ophthalmology;  Laterality: Right;    Allergies  No Known Allergies    History of Present Illness      77 y.o. y/o male with a history of CAD, HFpEF, alcohol abuse, COPD, hypertension, hyperlipidemia, tobacco abuse, obesity, abdominal aortic aneurysm status post endograft, and laryngeal cancer.  He previously suffered an inferior MI and underwent bare-metal stenting of the RCA x 2 in 2007.  He subsequently required PCI and drug-eluting stent placement to the mid LAD in 2008.  Diagnostic catheterization October 2016 showed an occluded mid LAD, proximal to the previously placed stent with diffuse disease and right to left collaterals.  RCA stents were patent.  He was medically managed.  Most recent echo in April 2019, showed normal LV function with grade 1 diastolic dysfunction and without any significant valvular abnormalities.  In September 2019, he underwent endovascular repair of abdominal aortic aneurysm with stent graft.    Bobby Sandoval was last seen in cardiology clinic in June 2023, at which time he denied chest pain.  He was still drinking a sixpack of beer daily and smoking 2 packs of cigarettes daily.  Bobby Sandoval was recently dx w/ laryngeal cancer.  PET scan on October 28 showed marked hypermetabolism associated with right laryngeal mass/2 right vocal cord as well as hypermetabolism associated with the peripheral aspect of the left side of the prostate gland, suspicious for prostate cancer.  6 mm central right upper lobe pulmonary nodule was also seen.  He has since followed-up with oncology, and will be placed on cisplatin.  He will also undergo radiation.  He did quit smoking last month but continues to drink a 12 pack of cigarettes a day.  He denies chest pain or dyspnea but notes activity is fairly limited.  He sits much of the  day and in that setting, has mild right greater than left lower extremity swelling.  He does have torsemide which he uses sparingly.  When he takes it a few days in a row, edema resolves but then comes back within a few weeks.  He denies palpitations, PND, orthopnea, dizziness, syncope, or early satiety. Objective  Home Medications    Current Outpatient Medications  Medication Sig Dispense Refill   ALPRAZolam (XANAX) 0.5 MG tablet Take 0.5 mg by mouth 4 (four) times daily as needed for anxiety.     aspirin EC 81 MG tablet Take 1 tablet (81 mg total) by mouth daily. 90 tablet 3   atorvastatin (LIPITOR) 40 MG tablet Take 1 tablet (40 mg total) by mouth daily. 30 tablet 1   enalapril (VASOTEC) 20 MG tablet Take 1 tablet (20 mg total) by mouth 2 (two) times daily. 60 tablet 3  Multiple Vitamins-Minerals (PRESERVISION AREDS 2) CAPS Take 1 tablet by mouth 2 (two) times daily.     nitroGLYCERIN (NITROSTAT) 0.4 MG SL tablet Place 1 tablet (0.4 mg total) under the tongue every 5 (five) minutes as needed for chest pain. 25 tablet 1   ondansetron (ZOFRAN) 8 MG tablet Take 1 tablet (8 mg total) by mouth every 8 (eight) hours as needed for nausea or vomiting. Start on the third day after cisplatin. 60 tablet 1   prochlorperazine (COMPAZINE) 10 MG tablet Take 1 tablet (10 mg total) by mouth every 6 (six) hours as needed (Nausea or vomiting). 60 tablet 1   torsemide (DEMADEX) 20 MG tablet Take 20 mg by mouth daily as needed.     No current facility-administered medications for this visit.     Physical Exam    VS:  BP (!) 148/78   Pulse (!) 55   Ht 6\' 1"  (1.854 m)   Wt 215 lb 3.2 oz (97.6 kg)   SpO2 95%   BMI 28.39 kg/m  , BMI Body mass index is 28.39 kg/m.       GEN: Well nourished, well developed, in no acute distress. HEENT: Speech is hoarse.  Otherwise normal-appearing. Neck: Supple, no JVD, carotid bruits, or masses. Cardiac: RRR, no murmurs, rubs, or gallops. No clubbing, cyanosis, edema.   Radials 2+/PT 2+ and equal bilaterally.  Respiratory:  Respirations regular and unlabored, clear to auscultation bilaterally. GI: Soft, nontender, nondistended, BS + x 4. MS: no deformity or atrophy. Skin: warm and dry, no rash. Neuro:  Strength and sensation are intact. Psych: Normal affect.  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  Labs dated 07/01/2023 from Care Everywhere:  Hemoglobin 16.2, hematocrit 46.5, WBC 4.9, platelets 179 Sodium 136, potassium 5.0, chloride 100, CO2 27.9, BUN 7, creatinine 0.8, glucose 106 Calcium 9.2, albumin 4.1, total protein 6.2 Total bilirubin 0.9, alkaline phosphatase 61, AST 19, ALT 14 Total cholesterol 208, triglycerides 47, HDL 64.8, LDL 134 PSA 2.47    Assessment & Plan    1.  Coronary artery disease: Status post prior inferior MI with RCA stenting in 2007 and subsequent LAD stenting in 2008, with finding of occluded LAD in October 2016.  He has since been medically managed.  He does not experience chest pain or dyspnea but notes activity is fairly limited.  He is taking aspirin a few days a week and notes that he is no longer taking atorvastatin.  Recent LDL performed through primary care office was 136.  He agreed to resume atorvastatin.  2.  Primary hypertension: Blood pressure elevated today on 2 consecutive recordings.  He reports compliance with enalapril.  He has a blood pressure cuff at home does not routinely check.  We discussed the importance of sodium restriction and as he was not interested in switching enalapril to an alternate agent, such as valsartan, we agreed that he  would check his blood pressures over the next 2 weeks and send [place] results.   3.  Hyperlipidemia: Recent LDL of 134.  He has not been taking his atorvastatin because he thought his numbers were  normal.  Strongly encouraged to resume atorvastatin 40 mg daily.  Offered to arrange for follow-up lipids and LFTs in 6 weeks however, he says he has follow-up with his primary care in January and will likely have labs at that time.  4.  Chronic HFpEF: He has chronic, mild right greater than left lower extremity edema, which is present on exam today.  He uses torsemide as needed but typically will only use for a few days in a row and then not take again for several weeks.  Strongly encouraged him to keep his legs elevated when sitting and use compression socks.  Pending chemotherapy and radiation, I do worry about his propensity for dehydration, and thus would like to see him use conservative measures and avoid using torsemide if at all possible.  Blood pressure elevated as outlined above.  He will send [place] home trends in 2 weeks.  5.  Tobacco abuse: Quit smoking in mid October in the setting of laryngeal cancer diagnosis.  I congratulated him on this.  6.  Alcohol abuse: Still drinking a 12 pack of beer a day and is not currently planning on cutting back.  He says with giving up smoking, he can only do 1 thing at a time.  Poor insight into the impact of alcohol and cancer diagnosis.  7.  Abdominal aortic aneurysm: Status post endovascular repair.  Stable appearance on recent PET scan.  Followed by vascular surgery.  8.  Disposition: Follow-up in 6 months or sooner if necessary.  Patient will send his blood pressure recordings in the next 2 weeks.  Nicolasa Ducking, NP 08/12/2023, 9:09 AM

## 2023-08-12 NOTE — Patient Instructions (Signed)
Medication Instructions:  Please restart taking Atorvastatin (Lipitor) 40 MG daily. *If you need a refill on your cardiac medications before your next appointment, please call your pharmacy*   Lab Work: None ordered today.  If you have labs (blood work) drawn today and your tests are completely normal, you will receive your results only by: MyChart Message (if you have MyChart) OR A paper copy in the mail If you have any lab test that is abnormal or we need to change your treatment, we will call you to review the results.   Testing/Procedures: None ordered today.   Follow-Up: At [facility], you and your health needs are our priority.  As part of our continuing mission to provide you with exceptional heart care, we have created designated Provider Care Teams.  These Care Teams include your primary Cardiologist (physician) and Advanced Practice Providers (APPs -  Physician Assistants and Nurse Practitioners) who all work together to provide you with the care you need, when you need it.  We recommend signing up for the patient portal called "MyChart".  Sign up information is provided on this After Visit Summary.  MyChart is used to connect with patients for Virtual Visits (Telemedicine).  Patients are able to view lab/test results, encounter notes, upcoming appointments, etc.  Non-urgent messages can be sent to your provider as well.   To learn more about what you can do with MyChart, go to [email or web address].    Your next appointment:   6 month(s)  Provider:   You may see Lorine Bears, MD or one of the following Advanced Practice Providers on your designated Care Team:   Nicolasa Ducking, NP   Other Instructions Please monitor blood pressures over the next 2 weeks and send over MyChart.

## 2023-08-13 ENCOUNTER — Ambulatory Visit
Admission: RE | Admit: 2023-08-13 | Discharge: 2023-08-13 | Disposition: A | Discharge status: Home / Self Care | Payer: Medicare Other | Source: Ambulatory Visit | Attending: Radiation Oncology | Admitting: Radiation Oncology

## 2023-08-13 DIAGNOSIS — C801 Malignant (primary) neoplasm, unspecified: Secondary | ICD-10-CM

## 2023-08-13 DIAGNOSIS — Z510 Encounter for antineoplastic radiation therapy: Secondary | ICD-10-CM | POA: Insufficient documentation

## 2023-08-13 DIAGNOSIS — C320 Malignant neoplasm of glottis: Secondary | ICD-10-CM | POA: Insufficient documentation

## 2023-08-13 NOTE — Progress Notes (Signed)
Pharmacist Chemotherapy Monitoring - Initial Assessment    Anticipated start date: 08/24/23   The following has been reviewed per standard work regarding the patient's treatment regimen: The patient's diagnosis, treatment plan and drug doses, and organ/hematologic function Lab orders and baseline tests specific to treatment regimen  The treatment plan start date, drug sequencing, and pre-medications Prior authorization status  Patient's documented medication list, including drug-drug interaction screen and prescriptions for anti-emetics and supportive care specific to the treatment regimen The drug concentrations, fluid compatibility, administration routes, and timing of the medications to be used The patient's access for treatment and lifetime cumulative dose history, if applicable  The patient's medication allergies and previous infusion related reactions, if applicable   Changes made to treatment plan:  N/A  Follow up needed:  N/A   Ebony Hail, Pharm.D., CPP 08/13/2023@8 :42 AM

## 2023-08-16 ENCOUNTER — Inpatient Hospital Stay: Payer: Medicare Other | Attending: Oncology

## 2023-08-16 DIAGNOSIS — Z510 Encounter for antineoplastic radiation therapy: Secondary | ICD-10-CM | POA: Diagnosis not present

## 2023-08-16 DIAGNOSIS — Z87891 Personal history of nicotine dependence: Secondary | ICD-10-CM | POA: Insufficient documentation

## 2023-08-16 DIAGNOSIS — R1310 Dysphagia, unspecified: Secondary | ICD-10-CM | POA: Insufficient documentation

## 2023-08-16 DIAGNOSIS — Z5111 Encounter for antineoplastic chemotherapy: Secondary | ICD-10-CM | POA: Insufficient documentation

## 2023-08-16 DIAGNOSIS — F410 Panic disorder [episodic paroxysmal anxiety] without agoraphobia: Secondary | ICD-10-CM | POA: Insufficient documentation

## 2023-08-16 DIAGNOSIS — C320 Malignant neoplasm of glottis: Secondary | ICD-10-CM | POA: Insufficient documentation

## 2023-08-16 DIAGNOSIS — Z79899 Other long term (current) drug therapy: Secondary | ICD-10-CM | POA: Insufficient documentation

## 2023-08-16 DIAGNOSIS — N429 Disorder of prostate, unspecified: Secondary | ICD-10-CM | POA: Insufficient documentation

## 2023-08-17 ENCOUNTER — Encounter: Payer: Self-pay | Admitting: Oncology

## 2023-08-17 ENCOUNTER — Encounter (INDEPENDENT_AMBULATORY_CARE_PROVIDER_SITE_OTHER): Payer: Medicare Other | Admitting: Ophthalmology

## 2023-08-17 DIAGNOSIS — H35033 Hypertensive retinopathy, bilateral: Secondary | ICD-10-CM | POA: Diagnosis not present

## 2023-08-17 DIAGNOSIS — H353112 Nonexudative age-related macular degeneration, right eye, intermediate dry stage: Secondary | ICD-10-CM

## 2023-08-17 DIAGNOSIS — H353221 Exudative age-related macular degeneration, left eye, with active choroidal neovascularization: Secondary | ICD-10-CM

## 2023-08-17 DIAGNOSIS — I10 Essential (primary) hypertension: Secondary | ICD-10-CM | POA: Diagnosis not present

## 2023-08-17 DIAGNOSIS — H35341 Macular cyst, hole, or pseudohole, right eye: Secondary | ICD-10-CM

## 2023-08-17 DIAGNOSIS — H43812 Vitreous degeneration, left eye: Secondary | ICD-10-CM

## 2023-08-19 ENCOUNTER — Ambulatory Visit: Payer: Medicare Other | Admitting: Physician Assistant

## 2023-08-19 ENCOUNTER — Telehealth: Payer: Self-pay | Admitting: Cardiovascular Disease

## 2023-08-19 ENCOUNTER — Other Ambulatory Visit: Payer: Self-pay

## 2023-08-19 VITALS — BP 168/86 | HR 57 | Ht 73.0 in | Wt 213.2 lb

## 2023-08-19 DIAGNOSIS — R948 Abnormal results of function studies of other organs and systems: Secondary | ICD-10-CM

## 2023-08-19 LAB — URINALYSIS, COMPLETE
Bilirubin, UA: NEGATIVE
Glucose, UA: NEGATIVE
Ketones, UA: NEGATIVE
Leukocytes,UA: NEGATIVE
Nitrite, UA: NEGATIVE
Protein,UA: NEGATIVE
Specific Gravity, UA: 1.01 (ref 1.005–1.030)
Urobilinogen, Ur: 0.2 mg/dL (ref 0.2–1.0)
pH, UA: 7 (ref 5.0–7.5)

## 2023-08-19 LAB — MICROSCOPIC EXAMINATION: Bacteria, UA: NONE SEEN

## 2023-08-19 NOTE — Progress Notes (Signed)
08/19/2023 10:17 AM   Gillermo Murdoch [birth year] [id number]  CC: Chief Complaint  Patient presents with   Other    PET scan abnormality   HPI: Bobby Sandoval is a 77 y.o. male with PMH right hydrocele who presents today for evaluation of an abnormality on PET scan.  He is accompanied today by his wife, who contributes to HPI.  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 Orlie Dakin and will start radiation therapy with Dr. Rushie Chestnut next week.  He underwent a PET scan on 08/02/2023, which was notable for a focus of hypermetabolism along the left peripheral aspect of the prostate that was suspicious for prostate cancer.   Today he reports no dysuria, gross hematuria, or obstructive urinary symptoms.  Overall his urination has been stable and he has no concerns associated with it.  No known family history of prostate cancer.  PSA history as below: 08/05/2023: 3.73 07/01/2023: 2.47 06/24/2022: 1.89 06/18/2021: 2.38 04/19/2018: 1.68 09/08/2016: 1.34  PMH: Past Medical History:  Diagnosis Date   AAA (abdominal aortic aneurysm) (HCC)    a. 2012 3.4 cm; b. 07/2017 Abd u/s: Dist abd Ao dil up to 4.8cm; c. 03/2018 CT Abd: infrarenal AAA 5.3 x 5.7cm; d. 06/2018 EVAR (28mm prox/69mm dist, 18cm length Gore Excluder Endoprosthesis).   Arthritis    CAD (coronary artery disease)    a. 2007 Inf MI w/ BMS to RCA x 2; b. 2008 PCI/DES to mid LAD @ [facility]; c. 07/2015 Cath: LAD 100 prox to prev placed stent w/ R->L collats, RCA stents patent. EF 50-55%. Med Rx; d. 04/2018 MV: EF 48%, mid-dist ant/apical/apical-lat ischemia->med rx.   Cataracts, bilateral    COPD (chronic obstructive pulmonary disease) (HCC)    Diastolic dysfunction    a. 02/2014 Echo: EF 55-65%. Gr1 DD, no rwma, mildly dil LA, nl RV fxn; b. 01/2018 Echo: EF 60-65%, Gr1 DD, mild MR, mildly dil LA. nl RV fxn. Nl PASP.   ETOH abuse    Grade I diastolic dysfunction    Hepatic steatosis     a. 03/2018 CT Abd: hepatic steatosis and possible early cirrhotic morphology of liver.   History of heart attack    History of kidney stones    Hyperlipidemia    Hypertension    MI (myocardial infarction) (HCC)    Mild mitral regurgitation by prior echocardiogram    Obesity    Panic disorder    Panic disorder    Status post AAA (abdominal aortic aneurysm) repair    Tobacco abuse     Surgical History: Past Surgical History:  Procedure Laterality Date   25 GAUGE PARS PLANA VITRECTOMY WITH 20 GAUGE MVR PORT FOR MACULAR HOLE Right 01/14/2021   Procedure: 25 GAUGE PARS PLANA VITRECTOMY WITH 20 GAUGE MVR PORT FOR MACULAR HOLE;  Surgeon: Sherrie George, MD;  Location: [facility] OR;  Service: Ophthalmology;  Laterality: Right;   AIR/FLUID EXCHANGE Right 01/14/2021   Procedure: AIR/FLUID EXCHANGE;  Surgeon: Sherrie George, MD;  Location: [facility] OR;  Service: Ophthalmology;  Laterality: Right;   BACK SURGERY     CARDIAC CATHETERIZATION  08/2006   x1 stent [facility]. Mid RCA: 3.5 x 16 mm overlap with 3.5 x 24 mm Liberte bare-metal stents   CARDIAC CATHETERIZATION  10/2006   x1 stent [facility]: Mid LAD: 2.5 x 28 mm Cypher drug-eluting   CARDIAC CATHETERIZATION N/A 07/25/2015   Procedure: Left Heart Cath and Coronary Angiography;  Surgeon: Iran Ouch, MD;  Location: [facility] INVASIVE CV LAB;  Service: Cardiovascular;  Laterality: N/A;   Cataract surgery Bilateral    COLONOSCOPY     coronary stents     ENDOVASCULAR REPAIR/STENT GRAFT N/A 06/08/2018   Procedure: ENDOVASCULAR REPAIR/STENT GRAFT;  Surgeon: Annice Needy, MD;  Location: [facility] INVASIVE CV LAB;  Service: Cardiovascular;  Laterality: N/A;   GAS INSERTION Right 01/14/2021   Procedure: INSERTION OF GAS;  Surgeon: Sherrie George, MD;  Location: [facility] OR;  Service: Ophthalmology;  Laterality: Right;   GAS/FLUID EXCHANGE Right 01/14/2021   Procedure: GAS/FLUID EXCHANGE;  Surgeon: Sherrie George, MD;  Location: [facility] OR;  Service: Ophthalmology;  Laterality:  Right;   MEMBRANE PEEL Right 01/14/2021   Procedure: MEMBRANE PEEL;  Surgeon: Sherrie George, MD;  Location: [facility] OR;  Service: Ophthalmology;  Laterality: Right;   MICRODISCECTOMY LUMBAR     MICROLARYNGOSCOPY Bilateral 07/20/2023   Procedure: MICRODIRECT LARYNGOSCOPY WITH BIOPSY OF VOCAL CORD LESION;  Surgeon: Linus Salmons, MD;  Location: [facility] ORS;  Service: ENT;  Laterality: Bilateral;   PHOTOCOAGULATION WITH LASER Right 01/14/2021   Procedure: PHOTOCOAGULATION WITH LASER;  Surgeon: Sherrie George, MD;  Location: [facility] OR;  Service: Ophthalmology;  Laterality: Right;   SERUM PATCH Right 01/14/2021   Procedure: SERUM PATCH;  Surgeon: Sherrie George, MD;  Location: [facility] OR;  Service: Ophthalmology;  Laterality: Right;    Home Medications:  Allergies as of 08/19/2023   No Known Allergies      Medication List        Accurate as of August 19, 2023 10:17 AM. If you have any questions, ask your nurse or doctor.          ALPRAZolam 0.5 MG tablet Commonly known as: XANAX Take 0.5 mg by mouth 4 (four) times daily as needed for anxiety.   aspirin EC 81 MG tablet Take 1 tablet (81 mg total) by mouth daily.   atorvastatin 40 MG tablet Commonly known as: LIPITOR Take 1 tablet (40 mg total) by mouth daily.   enalapril 20 MG tablet Commonly known as: Vasotec Take 1 tablet (20 mg total) by mouth 2 (two) times daily.   nitroGLYCERIN 0.4 MG SL tablet Commonly known as: NITROSTAT Place 1 tablet (0.4 mg total) under the tongue every 5 (five) minutes as needed for chest pain.   ondansetron 8 MG tablet Commonly known as: Zofran Take 1 tablet (8 mg total) by mouth every 8 (eight) hours as needed for nausea or vomiting. Start on the third day after cisplatin.   PreserVision AREDS 2 Caps Take 1 tablet by mouth 2 (two) times daily.   prochlorperazine 10 MG tablet Commonly known as: COMPAZINE Take 1 tablet (10 mg total) by mouth every 6 (six) hours as needed (Nausea or vomiting).    torsemide 20 MG tablet Commonly known as: DEMADEX Take 20 mg by mouth daily as needed.        Allergies:  No Known Allergies  Family History: Family History  Problem Relation Age of Onset   Hypertension Mother    Heart disease Father     Social History:   reports that he quit smoking about 4 weeks ago. His smoking use included cigarettes. He has a 100 pack-year smoking history. He has never used smokeless tobacco. He reports current alcohol use of about 84.0 standard drinks of alcohol per week. He reports that he does not use drugs.  Physical Exam: BP (!) 168/86   Pulse Marland Kitchen)  57   Ht 6\' 1"  (1.854 m)   Wt 213 lb 4 oz (96.7 kg)   BMI 28.13 kg/m   Constitutional:  Alert and oriented, no acute distress, nontoxic appearing HEENT: [place], AT Cardiovascular: No clubbing, cyanosis, or edema Respiratory: Normal respiratory effort, no increased work of breathing GU: Normal sphincter tone.  Smooth, symmetrically enlarged 30+ cc prostate without nodules or induration. Skin: No rashes, bruises or suspicious lesions Neurologic: Grossly intact, no focal deficits, moving all 4 extremities Psychiatric: Normal mood and affect  Laboratory Data: Results for orders placed or performed in visit on 08/05/23  PSA  Result Value Ref Range   Prostatic Specific Antigen 3.73 0.00 - 4.00 ng/mL   Assessment & Plan:   1. Abnormal positron emission tomography (PET) scan Incidental hypermetabolic focus on the left peripheral aspect of the prostate on PET scan.  Normal DRE.  No family history of prostate cancer.  PSA is WNL, but uptrending with concerning velocity.  No urinary concerns or gross hematuria.  Lengthy conversation with the patient and his wife today.  We discussed that based on PET findings, I do suspect that this hypermetabolic focus indicates a prostate cancer, however we cannot determine the aggressiveness of this without pursuing biopsy.  We discussed options including biopsy versus  surveillance.  We discussed that based on biopsy results, treatment options may include surveillance, radiation therapy, or ADT.  I do not think he would be a good surgical candidate based on age and comorbidities.    They would like to pursue biopsy, but wonder if it would be appropriate to delay this until January when he will have completed his radiation treatments.  I think this is appropriate, so we will schedule then.  No need to repeat PSA in the interim.  He left a urine specimen on the way out today, though he is not clinically infected so I suspect this will be bland. - Urinalysis, Complete   Return in about 2 months (around 10/19/2023) for Prostate biopsy with Dr. Lonna Cobb.  Carman Ching, PA-C  [facility] [place] [address] [place], [place] [address] [phone]

## 2023-08-19 NOTE — Patient Instructions (Signed)

## 2023-08-19 NOTE — Telephone Encounter (Signed)
   Pre-operative Risk Assessment    Patient Name: Bobby Sandoval  DOB: 1946-01-12 MRN: [id number]      Request for Surgical Clearance    Procedure:   Prostate biopsy  Date of Surgery:  Clearance 10/27/23                                 Surgeon:  Dr Lonna Cobb Surgeon's Group or Practice Name:  [facility] Phone number:  [phone] Fax number:  [phone]   Type of Clearance Requested:   - Pharmacy:  Hold Aspirin stop 5 to 7 days prior to procedure   Type of Anesthesia:  Not Indicated   Additional requests/questions:    Courtney Heys   08/19/2023, 2:55 PM

## 2023-08-20 ENCOUNTER — Other Ambulatory Visit: Payer: Self-pay

## 2023-08-20 NOTE — Telephone Encounter (Signed)
   Patient Name: Bobby Sandoval  DOB: 1946/03/10 MRN: [id number]  Primary Cardiologist: Lorine Bears, MD  Chart reviewed as part of pre-operative protocol coverage. Given past medical history and time since last visit, based on ACC/AHA guidelines, BAYRO TORELL is at acceptable risk for the planned procedure without further cardiovascular testing.  I recommend continuing aspirin throughout the peri-procedural timeframe, however if bleeding risk is felt to be unacceptable, he may hold Aspirin for 7 days prior the procedure.  I will route this recommendation to the requesting party via Epic fax function and remove from pre-op pool.  Please call with questions.  Nicolasa Ducking, NP 08/20/2023, 12:46 PM

## 2023-08-20 NOTE — Telephone Encounter (Signed)
Patient advised.

## 2023-08-23 ENCOUNTER — Encounter: Payer: Self-pay | Admitting: Oncology

## 2023-08-23 ENCOUNTER — Inpatient Hospital Stay: Payer: Medicare Other

## 2023-08-23 ENCOUNTER — Inpatient Hospital Stay (HOSPITAL_BASED_OUTPATIENT_CLINIC_OR_DEPARTMENT_OTHER): Payer: Medicare Other | Admitting: Oncology

## 2023-08-23 ENCOUNTER — Ambulatory Visit: Admission: RE | Admit: 2023-08-23 | Payer: Medicare Other | Source: Ambulatory Visit

## 2023-08-23 VITALS — BP 186/99 | HR 66 | Temp 97.0°F | Resp 18 | Ht 73.0 in | Wt 213.0 lb

## 2023-08-23 DIAGNOSIS — Z5111 Encounter for antineoplastic chemotherapy: Secondary | ICD-10-CM | POA: Diagnosis not present

## 2023-08-23 DIAGNOSIS — F410 Panic disorder [episodic paroxysmal anxiety] without agoraphobia: Secondary | ICD-10-CM | POA: Diagnosis not present

## 2023-08-23 DIAGNOSIS — N429 Disorder of prostate, unspecified: Secondary | ICD-10-CM | POA: Diagnosis not present

## 2023-08-23 DIAGNOSIS — C320 Malignant neoplasm of glottis: Secondary | ICD-10-CM

## 2023-08-23 DIAGNOSIS — Z87891 Personal history of nicotine dependence: Secondary | ICD-10-CM | POA: Diagnosis not present

## 2023-08-23 DIAGNOSIS — R1310 Dysphagia, unspecified: Secondary | ICD-10-CM | POA: Diagnosis not present

## 2023-08-23 DIAGNOSIS — Z79899 Other long term (current) drug therapy: Secondary | ICD-10-CM | POA: Diagnosis not present

## 2023-08-23 LAB — CBC WITH DIFFERENTIAL (CANCER CENTER ONLY)
Abs Immature Granulocytes: 0.03 10*3/uL (ref 0.00–0.07)
Basophils Absolute: 0 10*3/uL (ref 0.0–0.1)
Basophils Relative: 1 %
Eosinophils Absolute: 0.1 10*3/uL (ref 0.0–0.5)
Eosinophils Relative: 1 %
HCT: 43.6 % (ref 39.0–52.0)
Hemoglobin: 14.8 g/dL (ref 13.0–17.0)
Immature Granulocytes: 1 %
Lymphocytes Relative: 27 %
Lymphs Abs: 1.5 10*3/uL (ref 0.7–4.0)
MCH: 30.8 pg (ref 26.0–34.0)
MCHC: 33.9 g/dL (ref 30.0–36.0)
MCV: 90.8 fL (ref 80.0–100.0)
Monocytes Absolute: 0.6 10*3/uL (ref 0.1–1.0)
Monocytes Relative: 10 %
Neutro Abs: 3.4 10*3/uL (ref 1.7–7.7)
Neutrophils Relative %: 60 %
Platelet Count: 200 10*3/uL (ref 150–400)
RBC: 4.8 MIL/uL (ref 4.22–5.81)
RDW: 12.7 % (ref 11.5–15.5)
WBC Count: 5.5 10*3/uL (ref 4.0–10.5)
nRBC: 0 % (ref 0.0–0.2)

## 2023-08-23 LAB — BASIC METABOLIC PANEL - CANCER CENTER ONLY
Anion gap: 9 (ref 5–15)
BUN: 7 mg/dL — ABNORMAL LOW (ref 8–23)
CO2: 26 mmol/L (ref 22–32)
Calcium: 8.8 mg/dL — ABNORMAL LOW (ref 8.9–10.3)
Chloride: 101 mmol/L (ref 98–111)
Creatinine: 0.87 mg/dL (ref 0.61–1.24)
GFR, Estimated: >60 mL/min (ref >60)
Glucose, Bld: 112 mg/dL — ABNORMAL HIGH (ref 70–99)
Potassium: 4.3 mmol/L (ref 3.5–5.1)
Sodium: 136 mmol/L (ref 135–145)

## 2023-08-23 LAB — MAGNESIUM: Magnesium: 2.2 mg/dL (ref 1.7–2.4)

## 2023-08-23 MED FILL — Fosaprepitant Dimeglumine For IV Infusion 150 MG (Base Eq): INTRAVENOUS | Qty: 5 | Status: AC

## 2023-08-23 NOTE — Progress Notes (Signed)
[facility]  [phone] [phone]  ID: Bobby Sandoval OB: [birth year]  MR#: [id number]  VQQ#:[id number]  Patient Care Team: Gracelyn Nurse, MD as PCP - General (Internal Medicine) Iran Ouch, MD as PCP - Cardiology (Cardiology)  CHIEF COMPLAINT: Stage III squamous cell carcinoma of the right vocal cord.  INTERVAL HISTORY: Patient returns to clinic today for further evaluation and initiation of cycle 1 of weekly cisplatin along with his daily XRT.  He continues to have hoarseness of voice, but otherwise feels well.  He denies any pain or dysphagia.  He has no neurologic complaints.  He denies any recent fevers or illnesses.  He has a good appetite and denies weight loss.  He has no chest pain, shortness of breath, cough, or hemoptysis.  He denies any nausea, vomiting, constipation, or diarrhea.  He has no urinary complaints.  Patient offers no further specific complaints today.  REVIEW OF SYSTEMS:   Review of Systems  Constitutional: Negative.  Negative for fever, malaise/fatigue and weight loss.  Respiratory: Negative.  Negative for cough, hemoptysis and shortness of breath.   Cardiovascular: Negative.  Negative for chest pain and leg swelling.  Gastrointestinal: Negative.  Negative for abdominal pain.  Genitourinary: Negative.  Negative for dysuria.  Musculoskeletal: Negative.  Negative for back pain.  Skin: Negative.  Negative for rash.  Neurological: Negative.  Negative for dizziness, focal weakness, weakness and headaches.  Psychiatric/Behavioral: Negative.  The patient is not nervous/anxious.     As per HPI. Otherwise, a complete review of systems is negative.  PAST MEDICAL HISTORY: Past Medical History:  Diagnosis Date   AAA (abdominal aortic aneurysm) (HCC)    a. 2012 3.4 cm; b. 07/2017 Abd u/s: Dist abd Ao dil up to 4.8cm; c. 03/2018 CT Abd: infrarenal AAA 5.3 x 5.7cm; d. 06/2018 EVAR (28mm prox/72mm dist, 18cm length Gore  Excluder Endoprosthesis).   Arthritis    CAD (coronary artery disease)    a. 2007 Inf MI w/ BMS to RCA x 2; b. 2008 PCI/DES to mid LAD @ [facility]; c. 07/2015 Cath: LAD 100 prox to prev placed stent w/ R->L collats, RCA stents patent. EF 50-55%. Med Rx; d. 04/2018 MV: EF 48%, mid-dist ant/apical/apical-lat ischemia->med rx.   Cataracts, bilateral    COPD (chronic obstructive pulmonary disease) (HCC)    Diastolic dysfunction    a. 02/2014 Echo: EF 55-65%. Gr1 DD, no rwma, mildly dil LA, nl RV fxn; b. 01/2018 Echo: EF 60-65%, Gr1 DD, mild MR, mildly dil LA. nl RV fxn. Nl PASP.   ETOH abuse    Grade I diastolic dysfunction    Hepatic steatosis    a. 03/2018 CT Abd: hepatic steatosis and possible early cirrhotic morphology of liver.   History of heart attack    History of kidney stones    Hyperlipidemia    Hypertension    MI (myocardial infarction) (HCC)    Mild mitral regurgitation by prior echocardiogram    Obesity    Panic disorder    Panic disorder    Status post AAA (abdominal aortic aneurysm) repair    Tobacco abuse     PAST SURGICAL HISTORY: Past Surgical History:  Procedure Laterality Date   25 GAUGE PARS PLANA VITRECTOMY WITH 20 GAUGE MVR PORT FOR MACULAR HOLE Right 01/14/2021   Procedure: 25 GAUGE PARS PLANA VITRECTOMY WITH 20 GAUGE MVR PORT FOR MACULAR HOLE;  Surgeon: Sherrie George, MD;  Location: [facility] OR;  Service: Ophthalmology;  Laterality: Right;  AIR/FLUID EXCHANGE Right 01/14/2021   Procedure: AIR/FLUID EXCHANGE;  Surgeon: Sherrie George, MD;  Location: [facility] OR;  Service: Ophthalmology;  Laterality: Right;   BACK SURGERY     CARDIAC CATHETERIZATION  08/2006   x1 stent [facility]. Mid RCA: 3.5 x 16 mm overlap with 3.5 x 24 mm Liberte bare-metal stents   CARDIAC CATHETERIZATION  10/2006   x1 stent [facility]: Mid LAD: 2.5 x 28 mm Cypher drug-eluting   CARDIAC CATHETERIZATION N/A 07/25/2015   Procedure: Left Heart Cath and Coronary Angiography;  Surgeon: Iran Ouch, MD;  Location: [facility]  INVASIVE CV LAB;  Service: Cardiovascular;  Laterality: N/A;   Cataract surgery Bilateral    COLONOSCOPY     coronary stents     ENDOVASCULAR REPAIR/STENT GRAFT N/A 06/08/2018   Procedure: ENDOVASCULAR REPAIR/STENT GRAFT;  Surgeon: Annice Needy, MD;  Location: [facility] INVASIVE CV LAB;  Service: Cardiovascular;  Laterality: N/A;   GAS INSERTION Right 01/14/2021   Procedure: INSERTION OF GAS;  Surgeon: Sherrie George, MD;  Location: [facility] OR;  Service: Ophthalmology;  Laterality: Right;   GAS/FLUID EXCHANGE Right 01/14/2021   Procedure: GAS/FLUID EXCHANGE;  Surgeon: Sherrie George, MD;  Location: [facility] OR;  Service: Ophthalmology;  Laterality: Right;   MEMBRANE PEEL Right 01/14/2021   Procedure: MEMBRANE PEEL;  Surgeon: Sherrie George, MD;  Location[facility] OR;  Service: Ophthalmology;  Laterality: Right;   MICRODISCECTOMY LUMBAR     MICROLARYNGOSCOPY Bilateral 07/20/2023   Procedure: MICRODIRECT LARYNGOSCOPY WITH BIOPSY OF VOCAL CORD LESION;  Surgeon: Linus Salmons, MD;  Location: [facility] ORS;  Service: ENT;  Laterality: Bilateral;   PHOTOCOAGULATION WITH LASER Right 01/14/2021   Procedure: PHOTOCOAGULATION WITH LASER;  Surgeon: Sherrie George, MD;  Location: [facility] OR;  Service: Ophthalmology;  Laterality: Right;   SERUM PATCH Right 01/14/2021   Procedure: SERUM PATCH;  Surgeon: Sherrie George, MD;  Location: [facility] OR;  Service: Ophthalmology;  Laterality: Right;    FAMILY HISTORY: Family History  Problem Relation Age of Onset   Hypertension Mother    Heart disease Father     ADVANCED DIRECTIVES (Y/N):  N  HEALTH MAINTENANCE: Social History   Tobacco Use   Smoking status: Former    Current packs/day: 0.00    Average packs/day: 2.0 packs/day for 50.0 years (100.0 ttl pk-yrs)    Types: Cigarettes    Quit date: 07/20/2023    Years since quitting: 0.0   Smokeless tobacco: Never  Vaping Use   Vaping status: Never Used  Substance Use Topics   Alcohol use: Yes    Alcohol/week: 84.0  standard drinks of alcohol    Types: 84 Cans of beer per week    Comment: daily   Drug use: No     Colonoscopy:  PAP:  Bone density:  Lipid panel:  No Known Allergies  Current Outpatient Medications  Medication Sig Dispense Refill   ALPRAZolam (XANAX) 0.5 MG tablet Take 0.5 mg by mouth 4 (four) times daily as needed for anxiety.     aspirin EC 81 MG tablet Take 1 tablet (81 mg total) by mouth daily. 90 tablet 3   atorvastatin (LIPITOR) 40 MG tablet Take 1 tablet (40 mg total) by mouth daily. 30 tablet 1   enalapril (VASOTEC) 20 MG tablet Take 1 tablet (20 mg total) by mouth 2 (two) times daily. 60 tablet 3   Multiple Vitamins-Minerals (PRESERVISION AREDS 2) CAPS Take 1 tablet by mouth 2 (two) times daily.     nitroGLYCERIN (NITROSTAT)  0.4 MG SL tablet Place 1 tablet (0.4 mg total) under the tongue every 5 (five) minutes as needed for chest pain. 25 tablet 1   torsemide (DEMADEX) 20 MG tablet Take 20 mg by mouth daily as needed.     ondansetron (ZOFRAN) 8 MG tablet Take 1 tablet (8 mg total) by mouth every 8 (eight) hours as needed for nausea or vomiting. Start on the third day after cisplatin. (Patient not taking: Reported on 08/23/2023) 60 tablet 1   prochlorperazine (COMPAZINE) 10 MG tablet Take 1 tablet (10 mg total) by mouth every 6 (six) hours as needed (Nausea or vomiting). (Patient not taking: Reported on 08/23/2023) 60 tablet 1   No current facility-administered medications for this visit.    OBJECTIVE: Vitals:   08/23/23 0850  BP: (!) 186/99  Pulse: 66  Resp: 18  Temp: (!) 97 F (36.1 C)  SpO2: 97%     Body mass index is 28.1 kg/m.    ECOG FS:0 - Asymptomatic  General: Well-developed, well-nourished, no acute distress. Eyes: Pink conjunctiva, anicteric sclera. HEENT: Normocephalic, moist mucous membranes. Lungs: No audible wheezing or coughing. Heart: Regular rate and rhythm. Abdomen: Soft, nontender, no obvious distention. Musculoskeletal: No edema, cyanosis,  or clubbing. Neuro: Alert, answering all questions appropriately. Cranial nerves grossly intact. Skin: No rashes or petechiae noted. Psych: Normal affect.  LAB RESULTS:  Lab Results  Component Value Date   NA 136 08/23/2023   K 4.3 08/23/2023   CL 101 08/23/2023   CO2 26 08/23/2023   GLUCOSE 112 (H) 08/23/2023   BUN 7 (L) 08/23/2023   CREATININE 0.87 08/23/2023   CALCIUM 8.8 (L) 08/23/2023   PROT 6.2 (L) 01/14/2021   ALBUMIN 3.8 01/14/2021   AST 22 01/14/2021   ALT 22 01/14/2021   ALKPHOS 63 01/14/2021   BILITOT 1.2 01/14/2021   GFRNONAA >60 08/23/2023   GFRAA >60 06/09/2018    Lab Results  Component Value Date   WBC 5.5 08/23/2023   NEUTROABS 3.4 08/23/2023   HGB 14.8 08/23/2023   HCT 43.6 08/23/2023   MCV 90.8 08/23/2023   PLT 200 08/23/2023     STUDIES: NM PET Image Initial (PI) Skull Base To Thigh (F-18 FDG)  Result Date: 08/02/2023 CLINICAL DATA:  Initial treatment strategy for squamous cell carcinoma of the vocal cord. EXAM: NUCLEAR MEDICINE PET SKULL BASE TO THIGH TECHNIQUE: 11.1 mCi F-18 FDG was injected intravenously. Full-ring PET imaging was performed from the skull base to thigh after the radiotracer. CT data was obtained and used for attenuation correction and anatomic localization. Fasting blood glucose: 89 mg/dl COMPARISON:  Neck CT 62/13/0865. FINDINGS: Mediastinal blood pool activity: SUV max 2.64 Liver activity: SUV max NA NECK: Marked hypermetabolism associated with the right laryngeal mass associated with the true right vocal cord. SUV max is 15.10. No enlarged or hypermetabolic cervical lymph nodes. No findings to suggest subglottic extension. Incidental CT findings: Bilateral carotid artery calcifications. CHEST: No hypermetabolic mediastinal or hilar nodes. No suspicious pulmonary nodules on the CT scan. 6 mm central right upper lobe pulmonary nodule seen on the prior chest CT is not demonstrated on this examination. Incidental CT findings: Stable  emphysematous changes and pulmonary scarring. Stable advanced vascular disease. ABDOMEN/PELVIS: No abnormal hypermetabolic activity within the liver, pancreas, adrenal glands, or spleen. No hypermetabolic lymph nodes in the abdomen or pelvis. Focus of marked hypermetabolism associated with the peripheral aspect of the prostate gland on the left side. SUV max is 13.24 and highly suspicious for focus  of prostate cancer. Recommend correlation with PSA level and urology consultation. Incidental CT findings: Stable cholelithiasis. Aortoiliac stent graft noted surrounded by a stable 5 cm abdominal aortic aneurysm. No complicating features are identified. Stable severe sigmoid colon diverticulosis. Stable renal calculi. SKELETON: No focal hypermetabolic activity to suggest skeletal metastasis. Incidental CT findings: None. IMPRESSION: 1. Marked hypermetabolism associated with the right laryngeal mass associated with the true right vocal cord. 2. No findings for subglottic extension or metastatic cervical adenopathy. 3. Focus of marked hypermetabolism associated with the peripheral aspect of the prostate gland on the left side. Findings are highly suspicious for prostate cancer. Recommend correlation with PSA level and Urology consultation. 4. Stable 5 cm abdominal aortic aneurysm surrounded by an aortoiliac stent graft. No complicating features are identified. 5. Stable cholelithiasis and renal calculi. 6. 6 mm central right upper lobe pulmonary nodule seen on the prior chest CT is not demonstrated on this examination. Recommend attention on future studies. Electronically Signed   By: Rudie Meyer M.D.   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  PET scan results from August 02, 2023 reviewed independently and reported as above confirming stage of  disease.  Case was discussed at tumor board and although total laryngectomy should be considered given the significant risk of chronic aspiration, patient adamantly refuses surgical intervention.  Therefore, we will proceed with concurrent chemotherapy and XRT using weekly cisplatin.  Proceed with cycle 1 of treatment tomorrow.  Continue daily XRT.  Return to clinic in 1 week for further evaluation and consideration of cycle 2.   Hypermetabolic prostate lesion: Although patient's PSA is only 3.73, this is highly suspicious for underlying malignancy.  Patient was seen by urology who plans to do a biopsy at the conclusion of his treatments for his head and neck cancer.    I spent a total of 30 minutes reviewing chart data, face-to-face evaluation with the patient, counseling and coordination of care as detailed above.   Patient expressed understanding and was in agreement with this plan. He also understands that He can call clinic at any time with any questions, concerns, or complaints.    Cancer Staging  Squamous cell carcinoma of right vocal cord [facility]) Staging form: Larynx - Glottis, AJCC 8th Edition - Clinical stage from 08/06/2023: Stage III (cT3, cN0, cM0) - Signed by Jeralyn Ruths, MD on 08/06/2023 Stage prefix: Initial diagnosis   Jeralyn Ruths, MD   08/23/2023 12:26 PM

## 2023-08-24 ENCOUNTER — Inpatient Hospital Stay: Payer: Medicare Other

## 2023-08-24 ENCOUNTER — Other Ambulatory Visit: Payer: Self-pay

## 2023-08-24 ENCOUNTER — Ambulatory Visit
Admission: RE | Admit: 2023-08-24 | Discharge: 2023-08-24 | Disposition: A | Discharge status: Home / Self Care | Payer: Medicare Other | Source: Ambulatory Visit | Attending: Radiation Oncology | Admitting: Radiation Oncology

## 2023-08-24 VITALS — BP 164/73 | HR 66 | Temp 96.8°F | Resp 18

## 2023-08-24 DIAGNOSIS — R1310 Dysphagia, unspecified: Secondary | ICD-10-CM | POA: Diagnosis not present

## 2023-08-24 DIAGNOSIS — Z5111 Encounter for antineoplastic chemotherapy: Secondary | ICD-10-CM | POA: Diagnosis not present

## 2023-08-24 DIAGNOSIS — Z510 Encounter for antineoplastic radiation therapy: Secondary | ICD-10-CM | POA: Diagnosis not present

## 2023-08-24 DIAGNOSIS — C320 Malignant neoplasm of glottis: Secondary | ICD-10-CM | POA: Diagnosis not present

## 2023-08-24 DIAGNOSIS — N429 Disorder of prostate, unspecified: Secondary | ICD-10-CM | POA: Diagnosis not present

## 2023-08-24 DIAGNOSIS — Z79899 Other long term (current) drug therapy: Secondary | ICD-10-CM | POA: Diagnosis not present

## 2023-08-24 DIAGNOSIS — Z87891 Personal history of nicotine dependence: Secondary | ICD-10-CM | POA: Diagnosis not present

## 2023-08-24 DIAGNOSIS — F410 Panic disorder [episodic paroxysmal anxiety] without agoraphobia: Secondary | ICD-10-CM | POA: Diagnosis not present

## 2023-08-24 LAB — RAD ONC ARIA SESSION SUMMARY
Course Elapsed Days: 0
Plan Fractions Treated to Date: 1
Plan Prescribed Dose Per Fraction: 2 Gy
Plan Total Fractions Prescribed: 35
Plan Total Prescribed Dose: 70 Gy
Reference Point Dosage Given to Date: 2 Gy
Reference Point Session Dosage Given: 2 Gy
Session Number: 1

## 2023-08-24 MED ORDER — MAGNESIUM SULFATE 2 GM/50ML IV SOLN
2.0000 g | Freq: Once | INTRAVENOUS | Status: AC
Start: 2023-08-24 — End: 2023-08-24
  Administered 2023-08-24: 2 g via INTRAVENOUS
  Filled 2023-08-24: qty 50

## 2023-08-24 MED ORDER — POTASSIUM CHLORIDE IN NACL 20-0.9 MEQ/L-% IV SOLN
Freq: Once | INTRAVENOUS | Status: AC
  Filled 2023-08-24: qty 1000

## 2023-08-24 MED ORDER — PALONOSETRON HCL INJECTION 0.25 MG/5ML
0.2500 mg | Freq: Once | INTRAVENOUS | Status: AC
  Administered 2023-08-24: 0.25 mg via INTRAVENOUS
  Filled 2023-08-24: qty 5

## 2023-08-24 MED ORDER — CISPLATIN CHEMO INJECTION 100MG/100ML
40.0000 mg/m2 | Freq: Once | INTRAVENOUS | Status: AC
  Administered 2023-08-24: 90 mg via INTRAVENOUS
  Filled 2023-08-24: qty 90

## 2023-08-24 MED ORDER — DEXAMETHASONE SODIUM PHOSPHATE 10 MG/ML IJ SOLN
10.0000 mg | Freq: Once | INTRAMUSCULAR | Status: AC
Start: 2023-08-24 — End: 2023-08-24
  Administered 2023-08-24: 10 mg via INTRAVENOUS
  Filled 2023-08-24: qty 1

## 2023-08-24 MED ORDER — SODIUM CHLORIDE 0.9 % IV SOLN
150.0000 mg | Freq: Once | INTRAVENOUS | Status: AC
  Administered 2023-08-24: 150 mg via INTRAVENOUS
  Filled 2023-08-24: qty 150

## 2023-08-24 MED ORDER — SODIUM CHLORIDE 0.9 % IV SOLN
10.0000 mg | Freq: Once | INTRAVENOUS | Status: DC
Start: 2023-08-24 — End: 2023-08-24

## 2023-08-24 MED ORDER — SODIUM CHLORIDE 0.9 % IV SOLN
INTRAVENOUS | Status: DC
  Filled 2023-08-24: qty 250

## 2023-08-24 NOTE — Patient Instructions (Signed)
 [facility] - A DEPT OF [facility]  Discharge Instructions: Thank you for choosing [facility] to provide your oncology and hematology care.  If you have a lab appointment with the Cancer Center, please go directly to the Cancer Center and check in at the registration area.  Wear comfortable clothing and clothing appropriate for easy access to any Portacath or PICC line.   We strive to give you quality time with your provider. You may need to reschedule your appointment if you arrive late (15 or more minutes).  Arriving late affects you and other patients whose appointments are after yours.  Also, if you miss three or more appointments without notifying the office, you may be dismissed from the clinic at the provider's discretion.      For prescription refill requests, have your pharmacy contact our office and allow 72 hours for refills to be completed.    Today you received the following chemotherapy and/or immunotherapy agents CISPLATIN      To help prevent nausea and vomiting after your treatment, we encourage you to take your nausea medication as directed.  BELOW ARE SYMPTOMS THAT SHOULD BE REPORTED IMMEDIATELY: *FEVER GREATER THAN 100.4 F (38 C) OR HIGHER *CHILLS OR SWEATING *NAUSEA AND VOMITING THAT IS NOT CONTROLLED WITH YOUR NAUSEA MEDICATION *UNUSUAL SHORTNESS OF BREATH *UNUSUAL BRUISING OR BLEEDING *URINARY PROBLEMS (pain or burning when urinating, or frequent urination) *BOWEL PROBLEMS (unusual diarrhea, constipation, pain near the anus) TENDERNESS IN MOUTH AND THROAT WITH OR WITHOUT PRESENCE OF ULCERS (sore throat, sores in mouth, or a toothache) UNUSUAL RASH, SWELLING OR PAIN  UNUSUAL VAGINAL DISCHARGE OR ITCHING   Items with * indicate a potential emergency and should be followed up as soon as possible or go to the Emergency Department if any problems should occur.  Please show the CHEMOTHERAPY ALERT CARD or IMMUNOTHERAPY  ALERT CARD at check-in to the Emergency Department and triage nurse.  Should you have questions after your visit or need to cancel or reschedule your appointment, please contact [facility] - A DEPT OF [facility]  [phone] and follow the prompts.  Office hours are 8:00 a.m. to 4:30 p.m. Monday - Friday. Please note that voicemails left after 4:00 p.m. may not be returned until the following business day.  We are closed weekends and major holidays. You have access to a nurse at all times for urgent questions. Please call the main number to the clinic [phone] and follow the prompts.  For any non-urgent questions, you may also contact your provider using MyChart. We now offer e-Visits for anyone 25 and older to request care online for non-urgent symptoms. For details visit [email or web address].   Also download the MyChart app! Go to the app store, search "MyChart", open the app, select [place], and log in with your MyChart username and password.  Cisplatin Injection What is this medication? CISPLATIN (SIS pla tin) treats some types of cancer. It works by slowing down the growth of cancer cells. This medicine may be used for other purposes; ask your health care provider or pharmacist if you have questions. COMMON BRAND NAME(S): Platinol, Platinol -AQ What should I tell my care team before I take this medication? They need to know if you have any of these conditions: Eye disease, vision problems Hearing problems Kidney disease Low blood counts, such as low white cells, platelets, or red blood cells Tingling of the fingers or toes, or other  nerve disorder An unusual or allergic reaction to cisplatin, carboplatin, oxaliplatin, other medications, foods, dyes, or preservatives If you or your partner are pregnant or trying to get pregnant Breast-feeding How should I use this medication? This medication is injected into a vein. It is given by your care  team in a hospital or clinic setting. Talk to your care team about the use of this medication in children. Special care may be needed. Overdosage: If you think you have taken too much of this medicine contact a poison control center or emergency room at once. NOTE: This medicine is only for you. Do not share this medicine with others. What if I miss a dose? Keep appointments for follow-up doses. It is important not to miss your dose. Call your care team if you are unable to keep an appointment. What may interact with this medication? Do not take this medication with any of the following: Live virus vaccines This medication may also interact with the following: Certain antibiotics, such as amikacin, gentamicin, neomycin, polymyxin B, streptomycin, tobramycin, vancomycin Foscarnet This list may not describe all possible interactions. Give your health care provider a list of all the medicines, herbs, non-prescription drugs, or dietary supplements you use. Also tell them if you smoke, drink alcohol, or use illegal drugs. Some items may interact with your medicine. What should I watch for while using this medication? Your condition will be monitored carefully while you are receiving this medication. You may need blood work done while taking this medication. This medication may make you feel generally unwell. This is not uncommon, as chemotherapy can affect healthy cells as well as cancer cells. Report any side effects. Continue your course of treatment even though you feel ill unless your care team tells you to stop. This medication may increase your risk of getting an infection. Call your care team for advice if you get a fever, chills, sore throat, or other symptoms of a cold or flu. Do not treat yourself. Try to avoid being around people who are sick. Avoid taking medications that contain aspirin, acetaminophen, ibuprofen, naproxen, or ketoprofen unless instructed by your care team. These medications  may hide a fever. This medication may increase your risk to bruise or bleed. Call your care team if you notice any unusual bleeding. Be careful brushing or flossing your teeth or using a toothpick because you may get an infection or bleed more easily. If you have any dental work done, tell your dentist you are receiving this medication. Drink fluids as directed while you are taking this medication. This will help protect your kidneys. Call your care team if you get diarrhea. Do not treat yourself. Talk to your care team if you or your partner wish to become pregnant or think you might be pregnant. This medication can cause serious birth defects if taken during pregnancy and for 14 months after the last dose. A negative pregnancy test is required before starting this medication. A reliable form of contraception is recommended while taking this medication and for 14 months after the last dose. Talk to your care team about effective forms of contraception. Do not father a child while taking this medication and for 11 months after the last dose. Use a condom during sex during this time period. Do not breast-feed while taking this medication. This medication may cause infertility. Talk to your care team if you are concerned about your fertility. What side effects may I notice from receiving this medication? Side effects that you should  report to your care team as soon as possible: Allergic reactions--skin rash, itching, hives, swelling of the face, lips, tongue, or throat Eye pain, change in vision, vision loss Hearing loss, ringing in ears Infection--fever, chills, cough, sore throat, wounds that don't heal, pain or trouble when passing urine, general feeling of discomfort or being unwell Kidney injury--decrease in the amount of urine, swelling of the ankles, hands, or feet Low red blood cell level--unusual weakness or fatigue, dizziness, headache, trouble breathing Painful swelling, warmth, or redness  of the skin, blisters or sores at the infusion site Pain, tingling, or numbness in the hands or feet Unusual bruising or bleeding Side effects that usually do not require medical attention (report to your care team if they continue or are bothersome): Hair loss Nausea Vomiting This list may not describe all possible side effects. Call your doctor for medical advice about side effects. You may report side effects to FDA at 1-800-FDA-1088. Where should I keep my medication? This medication is given in a hospital or clinic. It will not be stored at home. NOTE: This sheet is a summary. It may not cover all possible information. If you have questions about this medicine, talk to your doctor, pharmacist, or health care provider.  2024 Elsevier/Gold Standard (2022-01-23 00:00:00)

## 2023-08-25 ENCOUNTER — Telehealth: Payer: Self-pay

## 2023-08-25 ENCOUNTER — Ambulatory Visit
Admission: RE | Admit: 2023-08-25 | Discharge: 2023-08-25 | Disposition: A | Discharge status: Home / Self Care | Payer: Medicare Other | Source: Ambulatory Visit | Attending: Radiation Oncology | Admitting: Radiation Oncology

## 2023-08-25 ENCOUNTER — Other Ambulatory Visit: Payer: Self-pay

## 2023-08-25 DIAGNOSIS — C320 Malignant neoplasm of glottis: Secondary | ICD-10-CM | POA: Diagnosis not present

## 2023-08-25 DIAGNOSIS — Z510 Encounter for antineoplastic radiation therapy: Secondary | ICD-10-CM | POA: Diagnosis not present

## 2023-08-25 LAB — RAD ONC ARIA SESSION SUMMARY
Course Elapsed Days: 1
Plan Fractions Treated to Date: 2
Plan Prescribed Dose Per Fraction: 2 Gy
Plan Total Fractions Prescribed: 35
Plan Total Prescribed Dose: 70 Gy
Reference Point Dosage Given to Date: 4 Gy
Reference Point Session Dosage Given: 2 Gy
Session Number: 2

## 2023-08-25 NOTE — Telephone Encounter (Signed)
Telephone call to patient for follow up after receiving first infusion.   Patient states infusion went great.  States eating good and drinking plenty of fluids.   Denies any nausea or vomiting.  Encouraged patient to call for any concerns or questions. 

## 2023-08-26 ENCOUNTER — Other Ambulatory Visit: Payer: Self-pay

## 2023-08-26 ENCOUNTER — Telehealth: Payer: Self-pay | Admitting: *Deleted

## 2023-08-26 ENCOUNTER — Ambulatory Visit
Admission: RE | Admit: 2023-08-26 | Discharge: 2023-08-26 | Disposition: A | Discharge status: Home / Self Care | Payer: Medicare Other | Source: Ambulatory Visit | Attending: Radiation Oncology | Admitting: Radiation Oncology

## 2023-08-26 DIAGNOSIS — C320 Malignant neoplasm of glottis: Secondary | ICD-10-CM | POA: Diagnosis not present

## 2023-08-26 DIAGNOSIS — Z510 Encounter for antineoplastic radiation therapy: Secondary | ICD-10-CM | POA: Diagnosis not present

## 2023-08-26 LAB — RAD ONC ARIA SESSION SUMMARY
Course Elapsed Days: 2
Plan Fractions Treated to Date: 3
Plan Prescribed Dose Per Fraction: 2 Gy
Plan Total Fractions Prescribed: 35
Plan Total Prescribed Dose: 70 Gy
Reference Point Dosage Given to Date: 6 Gy
Reference Point Session Dosage Given: 2 Gy
Session Number: 3

## 2023-08-26 MED ORDER — ALBUTEROL SULFATE HFA 108 (90 BASE) MCG/ACT IN AERS: 2.0000 | INHALATION_SPRAY | RESPIRATORY_TRACT | 1 refills | Status: DC | PRN

## 2023-08-26 NOTE — Telephone Encounter (Signed)
Was this just an albuterol inhaler?

## 2023-08-26 NOTE — Telephone Encounter (Signed)
Wife called stating that patient was to have had an inhaler sent in at his last appointment and it has not been sent. She requests it be sent please

## 2023-08-27 ENCOUNTER — Encounter: Payer: Self-pay | Admitting: Oncology

## 2023-08-27 ENCOUNTER — Other Ambulatory Visit: Payer: Self-pay | Admitting: *Deleted

## 2023-08-27 ENCOUNTER — Other Ambulatory Visit: Payer: Self-pay

## 2023-08-27 ENCOUNTER — Ambulatory Visit
Admission: RE | Admit: 2023-08-27 | Discharge: 2023-08-27 | Disposition: A | Discharge status: Home / Self Care | Payer: Medicare Other | Source: Ambulatory Visit | Attending: Radiation Oncology | Admitting: Radiation Oncology

## 2023-08-27 ENCOUNTER — Telehealth: Payer: Self-pay | Admitting: *Deleted

## 2023-08-27 DIAGNOSIS — C320 Malignant neoplasm of glottis: Secondary | ICD-10-CM | POA: Diagnosis not present

## 2023-08-27 DIAGNOSIS — Z510 Encounter for antineoplastic radiation therapy: Secondary | ICD-10-CM | POA: Diagnosis not present

## 2023-08-27 LAB — RAD ONC ARIA SESSION SUMMARY
Course Elapsed Days: 3
Plan Fractions Treated to Date: 4
Plan Prescribed Dose Per Fraction: 2 Gy
Plan Total Fractions Prescribed: 35
Plan Total Prescribed Dose: 70 Gy
Reference Point Dosage Given to Date: 8 Gy
Reference Point Session Dosage Given: 2 Gy
Session Number: 4

## 2023-08-27 MED ORDER — SUCRALFATE 1 G PO TABS: 1.0000 g | ORAL_TABLET | Freq: Four times a day (QID) | ORAL | 0 refills | Status: DC

## 2023-08-27 NOTE — Telephone Encounter (Addendum)
Patient called asking for medicine for his sore throat from his "vocal Cancer"  Please advise

## 2023-08-27 NOTE — Telephone Encounter (Signed)
CAll returned and wife informed that prescription has been sent for Carafate and that if he cannot swallow pill, it can be dissolved in water and taken that way

## 2023-08-30 ENCOUNTER — Inpatient Hospital Stay: Payer: Medicare Other | Admitting: Oncology

## 2023-08-30 ENCOUNTER — Inpatient Hospital Stay: Payer: Medicare Other

## 2023-08-30 ENCOUNTER — Ambulatory Visit
Admission: RE | Admit: 2023-08-30 | Discharge: 2023-08-30 | Disposition: A | Discharge status: Home / Self Care | Payer: Medicare Other | Source: Ambulatory Visit | Attending: Radiation Oncology | Admitting: Radiation Oncology

## 2023-08-30 ENCOUNTER — Other Ambulatory Visit: Payer: Self-pay

## 2023-08-30 VITALS — BP 167/70 | HR 68 | Temp 97.7°F | Wt 207.0 lb

## 2023-08-30 DIAGNOSIS — Z79899 Other long term (current) drug therapy: Secondary | ICD-10-CM | POA: Diagnosis not present

## 2023-08-30 DIAGNOSIS — C320 Malignant neoplasm of glottis: Secondary | ICD-10-CM

## 2023-08-30 DIAGNOSIS — F410 Panic disorder [episodic paroxysmal anxiety] without agoraphobia: Secondary | ICD-10-CM | POA: Diagnosis not present

## 2023-08-30 DIAGNOSIS — Z87891 Personal history of nicotine dependence: Secondary | ICD-10-CM | POA: Diagnosis not present

## 2023-08-30 DIAGNOSIS — Z510 Encounter for antineoplastic radiation therapy: Secondary | ICD-10-CM | POA: Diagnosis not present

## 2023-08-30 DIAGNOSIS — R1310 Dysphagia, unspecified: Secondary | ICD-10-CM | POA: Diagnosis not present

## 2023-08-30 DIAGNOSIS — N429 Disorder of prostate, unspecified: Secondary | ICD-10-CM | POA: Diagnosis not present

## 2023-08-30 DIAGNOSIS — Z5111 Encounter for antineoplastic chemotherapy: Secondary | ICD-10-CM | POA: Diagnosis not present

## 2023-08-30 LAB — CBC WITH DIFFERENTIAL (CANCER CENTER ONLY)
Abs Immature Granulocytes: 0.03 10*3/uL (ref 0.00–0.07)
Basophils Absolute: 0 10*3/uL (ref 0.0–0.1)
Basophils Relative: 1 %
Eosinophils Absolute: 0 10*3/uL (ref 0.0–0.5)
Eosinophils Relative: 1 %
HCT: 40.1 % (ref 39.0–52.0)
Hemoglobin: 13.7 g/dL (ref 13.0–17.0)
Immature Granulocytes: 1 %
Lymphocytes Relative: 17 %
Lymphs Abs: 1.1 10*3/uL (ref 0.7–4.0)
MCH: 31.1 pg (ref 26.0–34.0)
MCHC: 34.2 g/dL (ref 30.0–36.0)
MCV: 91.1 fL (ref 80.0–100.0)
Monocytes Absolute: 0.6 10*3/uL (ref 0.1–1.0)
Monocytes Relative: 10 %
Neutro Abs: 4.5 10*3/uL (ref 1.7–7.7)
Neutrophils Relative %: 70 %
Platelet Count: 187 10*3/uL (ref 150–400)
RBC: 4.4 MIL/uL (ref 4.22–5.81)
RDW: 12.4 % (ref 11.5–15.5)
WBC Count: 6.2 10*3/uL (ref 4.0–10.5)
nRBC: 0 % (ref 0.0–0.2)

## 2023-08-30 LAB — RAD ONC ARIA SESSION SUMMARY
Course Elapsed Days: 6
Plan Fractions Treated to Date: 5
Plan Prescribed Dose Per Fraction: 2 Gy
Plan Total Fractions Prescribed: 35
Plan Total Prescribed Dose: 70 Gy
Reference Point Dosage Given to Date: 10 Gy
Reference Point Session Dosage Given: 2 Gy
Session Number: 5

## 2023-08-30 LAB — BASIC METABOLIC PANEL - CANCER CENTER ONLY
Anion gap: 11 (ref 5–15)
BUN: 13 mg/dL (ref 8–23)
CO2: 27 mmol/L (ref 22–32)
Calcium: 9.1 mg/dL (ref 8.9–10.3)
Chloride: 96 mmol/L — ABNORMAL LOW (ref 98–111)
Creatinine: 0.73 mg/dL (ref 0.61–1.24)
GFR, Estimated: >60 mL/min (ref >60)
Glucose, Bld: 112 mg/dL — ABNORMAL HIGH (ref 70–99)
Potassium: 4 mmol/L (ref 3.5–5.1)
Sodium: 134 mmol/L — ABNORMAL LOW (ref 135–145)

## 2023-08-30 LAB — MAGNESIUM: Magnesium: 2.1 mg/dL (ref 1.7–2.4)

## 2023-08-30 MED FILL — Fosaprepitant Dimeglumine For IV Infusion 150 MG (Base Eq): INTRAVENOUS | Qty: 5 | Status: AC

## 2023-08-30 NOTE — Progress Notes (Signed)
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  INTERVAL HISTORY: Patient returns to clinic today for repeat laboratory work, further evaluation, and consideration of cycle 2 of weekly cisplatin along with a daily XRT.  He has increased dysphagia, but otherwise feels well.  He tolerated his first treatment without significant side effects.  He has no neurologic complaints.  He denies any recent fevers or illnesses.  He has a good appetite and denies weight loss.  He has no chest pain, shortness of breath, cough, or hemoptysis.  He denies any nausea, vomiting, constipation, or diarrhea.  He has no urinary complaints.  He expressed concern that his primary care physician has decreased his Xanax dose for his panic attacks.  Patient offers no further specific complaints today.  REVIEW OF SYSTEMS:   Review of Systems  Constitutional: Negative.  Negative for fever, malaise/fatigue and weight loss.  Respiratory: Negative.  Negative for cough, hemoptysis and shortness of breath.   Cardiovascular: Negative.  Negative for chest pain and leg swelling.  Gastrointestinal: Negative.  Negative for abdominal pain.  Genitourinary: Negative.  Negative for dysuria.  Musculoskeletal: Negative.  Negative for back pain.  Skin: Negative.  Negative for rash.  Neurological: Negative.  Negative for dizziness, focal weakness, weakness and headaches.  Psychiatric/Behavioral: Negative.  The patient is not nervous/anxious.     As per HPI. Otherwise, a complete review of systems is negative.  PAST MEDICAL HISTORY: Past Medical History:  Diagnosis Date   AAA (abdominal aortic aneurysm) (HCC)    a. 2012 3.4 cm;  b. 07/2017 Abd u/s: Dist abd Ao dil up to 4.8cm; c. 03/2018 CT Abd: infrarenal AAA 5.3 x 5.7cm; d. 06/2018 EVAR (28mm prox/45mm dist, 18cm length Gore Excluder Endoprosthesis).   Arthritis    CAD (coronary artery disease)    a. 2007 Inf MI w/ BMS to RCA x 2; b. 2008 PCI/DES to mid LAD @ [facility]; c. 07/2015 Cath: LAD 100 prox to prev placed stent w/ R->L collats, RCA stents patent. EF 50-55%. Med Rx; d. 04/2018 MV: EF 48%, mid-dist ant/apical/apical-lat ischemia->med rx.   Cataracts, bilateral    COPD (chronic obstructive pulmonary disease) (HCC)    Diastolic dysfunction    a. 02/2014 Echo: EF 55-65%. Gr1 DD, no rwma, mildly dil LA, nl RV fxn; b. 01/2018 Echo: EF 60-65%, Gr1 DD, mild MR, mildly dil LA. nl RV fxn. Nl PASP.   ETOH abuse    Grade I diastolic dysfunction    Hepatic steatosis    a. 03/2018 CT Abd: hepatic steatosis and possible early cirrhotic morphology of liver.   History of heart attack    History of kidney stones    Hyperlipidemia    Hypertension    MI (myocardial infarction) (HCC)    Mild mitral regurgitation by prior echocardiogram    Obesity    Panic disorder    Panic disorder    Status post AAA (abdominal aortic aneurysm) repair    Tobacco abuse     PAST SURGICAL HISTORY: Past Surgical History:  Procedure Laterality Date   25 GAUGE PARS PLANA VITRECTOMY WITH 20 GAUGE MVR PORT FOR MACULAR HOLE Right 01/14/2021   Procedure: 25 GAUGE PARS PLANA VITRECTOMY WITH 20 GAUGE MVR  PORT FOR MACULAR HOLE;  Surgeon: Sherrie George, MD;  Location: [facility] OR;  Service: Ophthalmology;  Laterality: Right;   AIR/FLUID EXCHANGE Right 01/14/2021   Procedure: AIR/FLUID EXCHANGE;  Surgeon: Sherrie George, MD;  Location: [facility] OR;  Service: Ophthalmology;  Laterality: Right;   BACK SURGERY     CARDIAC CATHETERIZATION  08/2006   x1 stent [facility]. Mid RCA: 3.5 x 16 mm overlap with 3.5 x 24 mm Liberte bare-metal stents   CARDIAC CATHETERIZATION  10/2006   x1 stent [facility]: Mid LAD: 2.5 x 28 mm Cypher  drug-eluting   CARDIAC CATHETERIZATION N/A 07/25/2015   Procedure: Left Heart Cath and Coronary Angiography;  Surgeon: Iran Ouch, MD;  Location: [facility] INVASIVE CV LAB;  Service: Cardiovascular;  Laterality: N/A;   Cataract surgery Bilateral    COLONOSCOPY     coronary stents     ENDOVASCULAR REPAIR/STENT GRAFT N/A 06/08/2018   Procedure: ENDOVASCULAR REPAIR/STENT GRAFT;  Surgeon: Annice Needy, MD;  Location: [facility] INVASIVE CV LAB;  Service: Cardiovascular;  Laterality: N/A;   GAS INSERTION Right 01/14/2021   Procedure: INSERTION OF GAS;  Surgeon: Sherrie George, MD;  Location: [facility] OR;  Service: Ophthalmology;  Laterality: Right;   GAS/FLUID EXCHANGE Right 01/14/2021   Procedure: GAS/FLUID EXCHANGE;  Surgeon: Sherrie George, MD;  Location: [facility] OR;  Service: Ophthalmology;  Laterality: Right;   MEMBRANE PEEL Right 01/14/2021   Procedure: MEMBRANE PEEL;  Surgeon: Sherrie George, MD;  Location: [facility] OR;  Service: Ophthalmology;  Laterality: Right;   MICRODISCECTOMY LUMBAR     MICROLARYNGOSCOPY Bilateral 07/20/2023   Procedure: MICRODIRECT LARYNGOSCOPY WITH BIOPSY OF VOCAL CORD LESION;  Surgeon: Linus Salmons, MD;  Location: [facility] ORS;  Service: ENT;  Laterality: Bilateral;   PHOTOCOAGULATION WITH LASER Right 01/14/2021   Procedure: PHOTOCOAGULATION WITH LASER;  Surgeon: Sherrie George, MD;  Location: [facility] OR;  Service: Ophthalmology;  Laterality: Right;   SERUM PATCH Right 01/14/2021   Procedure: SERUM PATCH;  Surgeon: Sherrie George, MD;  Location: [facility] OR;  Service: Ophthalmology;  Laterality: Right;    FAMILY HISTORY: Family History  Problem Relation Age of Onset   Hypertension Mother    Heart disease Father     ADVANCED DIRECTIVES (Y/N):  N  HEALTH MAINTENANCE: Social History   Tobacco Use   Smoking status: Former    Current packs/day: 0.00    Average packs/day: 2.0 packs/day for 50.0 years (100.0 ttl pk-yrs)    Types: Cigarettes    Quit date: 07/20/2023    Years  since quitting: 0.1   Smokeless tobacco: Never  Vaping Use   Vaping status: Never Used  Substance Use Topics   Alcohol use: Yes    Alcohol/week: 84.0 standard drinks of alcohol    Types: 84 Cans of beer per week    Comment: daily   Drug use: No     Colonoscopy:  PAP:  Bone density:  Lipid panel:  No Known Allergies  Current Outpatient Medications  Medication Sig Dispense Refill   albuterol (VENTOLIN HFA) 108 (90 Base) MCG/ACT inhaler Inhale 2 puffs into the lungs every 4 (four) hours as needed for wheezing or shortness of breath (cough, shortness of breath or wheezing.). 1 each 1   ALPRAZolam (XANAX) 0.5 MG tablet Take 0.5 mg by mouth 4 (four) times daily as needed for anxiety.     aspirin EC 81 MG tablet Take 1 tablet (81 mg total) by mouth daily. 90 tablet 3   atorvastatin (LIPITOR) 40  MG tablet Take 1 tablet (40 mg total) by mouth daily. 30 tablet 1   enalapril (VASOTEC) 20 MG tablet Take 1 tablet (20 mg total) by mouth 2 (two) times daily. 60 tablet 3   Multiple Vitamins-Minerals (PRESERVISION AREDS 2) CAPS Take 1 tablet by mouth 2 (two) times daily.     nitroGLYCERIN (NITROSTAT) 0.4 MG SL tablet Place 1 tablet (0.4 mg total) under the tongue every 5 (five) minutes as needed for chest pain. 25 tablet 1   ondansetron (ZOFRAN) 8 MG tablet Take 1 tablet (8 mg total) by mouth every 8 (eight) hours as needed for nausea or vomiting. Start on the third day after cisplatin. (Patient not taking: Reported on 08/23/2023) 60 tablet 1   prochlorperazine (COMPAZINE) 10 MG tablet Take 1 tablet (10 mg total) by mouth every 6 (six) hours as needed (Nausea or vomiting). (Patient not taking: Reported on 08/23/2023) 60 tablet 1   sucralfate (CARAFATE) 1 g tablet Take 1 tablet (1 g total) by mouth 4 (four) times daily. 120 tablet 0   torsemide (DEMADEX) 20 MG tablet Take 20 mg by mouth daily as needed.     No current facility-administered medications for this visit.    OBJECTIVE: Vitals:    08/30/23 0856  BP: (!) 167/70  Pulse: 68  Temp: 97.7 F (36.5 C)  SpO2: 95%     Body mass index is 27.31 kg/m.    ECOG FS:0 - Asymptomatic  General: Well-developed, well-nourished, no acute distress. Eyes: Pink conjunctiva, anicteric sclera. HEENT: Normocephalic, moist mucous membranes.  No palpable lymphadenopathy. Lungs: No audible wheezing or coughing. Heart: Regular rate and rhythm. Abdomen: Soft, nontender, no obvious distention. Musculoskeletal: No edema, cyanosis, or clubbing. Neuro: Alert, answering all questions appropriately. Cranial nerves grossly intact. Skin: No rashes or petechiae noted. Psych: Normal affect.  LAB RESULTS:  Lab Results  Component Value Date   NA 134 (L) 08/30/2023   K 4.0 08/30/2023   CL 96 (L) 08/30/2023   CO2 27 08/30/2023   GLUCOSE 112 (H) 08/30/2023   BUN 13 08/30/2023   CREATININE 0.73 08/30/2023   CALCIUM 9.1 08/30/2023   PROT 6.2 (L) 01/14/2021   ALBUMIN 3.8 01/14/2021   AST 22 01/14/2021   ALT 22 01/14/2021   ALKPHOS 63 01/14/2021   BILITOT 1.2 01/14/2021   GFRNONAA >60 08/30/2023   GFRAA >60 06/09/2018    Lab Results  Component Value Date   WBC 6.2 08/30/2023   NEUTROABS 4.5 08/30/2023   HGB 13.7 08/30/2023   HCT 40.1 08/30/2023   MCV 91.1 08/30/2023   PLT 187 08/30/2023     STUDIES: NM PET Image Initial (PI) Skull Base To Thigh (F-18 FDG)  Result Date: 08/02/2023 CLINICAL DATA:  Initial treatment strategy for squamous cell carcinoma of the vocal cord. EXAM: NUCLEAR MEDICINE PET SKULL BASE TO THIGH TECHNIQUE: 11.1 mCi F-18 FDG was injected intravenously. Full-ring PET imaging was performed from the skull base to thigh after the radiotracer. CT data was obtained and used for attenuation correction and anatomic localization. Fasting blood glucose: 89 mg/dl COMPARISON:  Neck CT 40/98/1191. FINDINGS: Mediastinal blood pool activity: SUV max 2.64 Liver activity: SUV max NA NECK: Marked hypermetabolism associated with the  right laryngeal mass associated with the true right vocal cord. SUV max is 15.10. No enlarged or hypermetabolic cervical lymph nodes. No findings to suggest subglottic extension. Incidental CT findings: Bilateral carotid artery calcifications. CHEST: No hypermetabolic mediastinal or hilar nodes. No suspicious pulmonary nodules on the CT scan.  6 mm central right upper lobe pulmonary nodule seen on the prior chest CT is not demonstrated on this examination. Incidental CT findings: Stable emphysematous changes and pulmonary scarring. Stable advanced vascular disease. ABDOMEN/PELVIS: No abnormal hypermetabolic activity within the liver, pancreas, adrenal glands, or spleen. No hypermetabolic lymph nodes in the abdomen or pelvis. Focus of marked hypermetabolism associated with the peripheral aspect of the prostate gland on the left side. SUV max is 13.24 and highly suspicious for focus of prostate cancer. Recommend correlation with PSA level and urology consultation. Incidental CT findings: Stable cholelithiasis. Aortoiliac stent graft noted surrounded by a stable 5 cm abdominal aortic aneurysm. No complicating features are identified. Stable severe sigmoid colon diverticulosis. Stable renal calculi. SKELETON: No focal hypermetabolic activity to suggest skeletal metastasis. Incidental CT findings: None. IMPRESSION: 1. Marked hypermetabolism associated with the right laryngeal mass associated with the true right vocal cord. 2. No findings for subglottic extension or metastatic cervical adenopathy. 3. Focus of marked hypermetabolism associated with the peripheral aspect of the prostate gland on the left side. Findings are highly suspicious for prostate cancer. Recommend correlation with PSA level and Urology consultation. 4. Stable 5 cm abdominal aortic aneurysm surrounded by an aortoiliac stent graft. No complicating features are identified. 5. Stable cholelithiasis and renal calculi. 6. 6 mm central right upper lobe  pulmonary nodule seen on the prior chest CT is not demonstrated on this examination. Recommend attention on future studies. Electronically Signed   By: Rudie Meyer M.D.   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  PET scan results from August 02, 2023 reviewed independently and reported as above confirming stage of disease.  Case was discussed at tumor board and although total laryngectomy should be considered given the significant risk of chronic aspiration, patient adamantly refuses surgical intervention.  Therefore, will proceed with concurrent chemotherapy and XRT using weekly cisplatin.  Proceed with cycle 2 of treatment tomorrow.  Continue daily XRT.  Return to clinic in 1 week for further evaluation and consideration of cycle 3.  Hypermetabolic prostate lesion: Although patient's PSA is only 3.73, this is highly suspicious for underlying malignancy.  Patient was seen by urology who plans to do a biopsy at the conclusion of his treatments for his head and neck cancer.   Dysphagia: Patient was given a prescription for Carafate. Panic attacks: Patient has been instructed to call his primary care regarding his Xanax dosing.  Possibly keeping the same dose until he completes his treatments.  Patient expressed understanding and was in agreement with this plan. He also understands that He can call clinic at any time with any questions, concerns, or complaints.    Cancer Staging  Squamous cell carcinoma of right vocal cord [facility]) Staging form: Larynx - Glottis, AJCC 8th Edition - Clinical stage from 08/06/2023: Stage III (cT3, cN0, cM0) - Signed by Jeralyn Ruths, MD on 08/06/2023 Stage prefix: Initial diagnosis   Jeralyn Ruths, MD   08/30/2023 11:45 AM

## 2023-08-30 NOTE — Progress Notes (Signed)
Patient says that his sore throat started on Wednesday, He is unable to cough anything up but he is able to cough.  His blood pressure has started being up in the 170 systolic.

## 2023-08-31 ENCOUNTER — Other Ambulatory Visit: Payer: Self-pay

## 2023-08-31 ENCOUNTER — Inpatient Hospital Stay: Payer: Medicare Other

## 2023-08-31 ENCOUNTER — Ambulatory Visit
Admission: RE | Admit: 2023-08-31 | Discharge: 2023-08-31 | Disposition: A | Discharge status: Home / Self Care | Payer: Medicare Other | Source: Ambulatory Visit | Attending: Radiation Oncology | Admitting: Radiation Oncology

## 2023-08-31 VITALS — BP 171/78 | HR 89 | Temp 98.0°F | Resp 16

## 2023-08-31 DIAGNOSIS — Z5111 Encounter for antineoplastic chemotherapy: Secondary | ICD-10-CM | POA: Diagnosis not present

## 2023-08-31 DIAGNOSIS — R1310 Dysphagia, unspecified: Secondary | ICD-10-CM | POA: Diagnosis not present

## 2023-08-31 DIAGNOSIS — Z510 Encounter for antineoplastic radiation therapy: Secondary | ICD-10-CM | POA: Diagnosis not present

## 2023-08-31 DIAGNOSIS — N429 Disorder of prostate, unspecified: Secondary | ICD-10-CM | POA: Diagnosis not present

## 2023-08-31 DIAGNOSIS — Z87891 Personal history of nicotine dependence: Secondary | ICD-10-CM | POA: Diagnosis not present

## 2023-08-31 DIAGNOSIS — F410 Panic disorder [episodic paroxysmal anxiety] without agoraphobia: Secondary | ICD-10-CM | POA: Diagnosis not present

## 2023-08-31 DIAGNOSIS — C320 Malignant neoplasm of glottis: Secondary | ICD-10-CM | POA: Diagnosis not present

## 2023-08-31 DIAGNOSIS — Z79899 Other long term (current) drug therapy: Secondary | ICD-10-CM | POA: Diagnosis not present

## 2023-08-31 LAB — RAD ONC ARIA SESSION SUMMARY
Course Elapsed Days: 7
Plan Fractions Treated to Date: 6
Plan Prescribed Dose Per Fraction: 2 Gy
Plan Total Fractions Prescribed: 35
Plan Total Prescribed Dose: 70 Gy
Reference Point Dosage Given to Date: 12 Gy
Reference Point Session Dosage Given: 2 Gy
Session Number: 6

## 2023-08-31 MED ORDER — MAGNESIUM SULFATE 2 GM/50ML IV SOLN
2.0000 g | Freq: Once | INTRAVENOUS | Status: AC
  Administered 2023-08-31: 2 g via INTRAVENOUS
  Filled 2023-08-31: qty 50

## 2023-08-31 MED ORDER — SODIUM CHLORIDE 0.9 % IV SOLN
40.0000 mg/m2 | Freq: Once | INTRAVENOUS | Status: AC
  Administered 2023-08-31: 90 mg via INTRAVENOUS
  Filled 2023-08-31: qty 90

## 2023-08-31 MED ORDER — SODIUM CHLORIDE 0.9 % IV SOLN
INTRAVENOUS | Status: DC
  Filled 2023-08-31: qty 250

## 2023-08-31 MED ORDER — PALONOSETRON HCL INJECTION 0.25 MG/5ML
0.2500 mg | Freq: Once | INTRAVENOUS | Status: AC
  Administered 2023-08-31: 0.25 mg via INTRAVENOUS
  Filled 2023-08-31: qty 5

## 2023-08-31 MED ORDER — DEXAMETHASONE SODIUM PHOSPHATE 10 MG/ML IJ SOLN
10.0000 mg | Freq: Once | INTRAMUSCULAR | Status: AC
  Administered 2023-08-31: 10 mg via INTRAVENOUS
  Filled 2023-08-31: qty 1

## 2023-08-31 MED ORDER — POTASSIUM CHLORIDE IN NACL 20-0.9 MEQ/L-% IV SOLN
Freq: Once | INTRAVENOUS | Status: AC
  Filled 2023-08-31: qty 1000

## 2023-08-31 MED ORDER — SODIUM CHLORIDE 0.9 % IV SOLN
150.0000 mg | Freq: Once | INTRAVENOUS | Status: AC
  Administered 2023-08-31: 150 mg via INTRAVENOUS
  Filled 2023-08-31: qty 150

## 2023-08-31 NOTE — Patient Instructions (Signed)
[facility] - A DEPT OF [facility]  Discharge Instructions: Thank you for choosing [facility] to provide your oncology and hematology care.  If you have a lab appointment with the Cancer Center, please go directly to the Cancer Center and check in at the registration area.  Wear comfortable clothing and clothing appropriate for easy access to any Portacath or PICC line.   We strive to give you quality time with your provider. You may need to reschedule your appointment if you arrive late (15 or more minutes).  Arriving late affects you and other patients whose appointments are after yours.  Also, if you miss three or more appointments without notifying the office, you may be dismissed from the clinic at the provider's discretion.      For prescription refill requests, have your pharmacy contact our office and allow 72 hours for refills to be completed.    Today you received the following chemotherapy and/or immunotherapy agents CISPLATIN       To help prevent nausea and vomiting after your treatment, we encourage you to take your nausea medication as directed.  BELOW ARE SYMPTOMS THAT SHOULD BE REPORTED IMMEDIATELY: *FEVER GREATER THAN 100.4 F (38 C) OR HIGHER *CHILLS OR SWEATING *NAUSEA AND VOMITING THAT IS NOT CONTROLLED WITH YOUR NAUSEA MEDICATION *UNUSUAL SHORTNESS OF BREATH *UNUSUAL BRUISING OR BLEEDING *URINARY PROBLEMS (pain or burning when urinating, or frequent urination) *BOWEL PROBLEMS (unusual diarrhea, constipation, pain near the anus) TENDERNESS IN MOUTH AND THROAT WITH OR WITHOUT PRESENCE OF ULCERS (sore throat, sores in mouth, or a toothache) UNUSUAL RASH, SWELLING OR PAIN  UNUSUAL VAGINAL DISCHARGE OR ITCHING   Items with * indicate a potential emergency and should be followed up as soon as possible or go to the Emergency Department if any problems should occur.  Please show the CHEMOTHERAPY ALERT CARD or IMMUNOTHERAPY  ALERT CARD at check-in to the Emergency Department and triage nurse.  Should you have questions after your visit or need to cancel or reschedule your appointment, please contact [place] [facility] - A DEPT OF [facility]  [phone] and follow the prompts.  Office hours are 8:00 a.m. to 4:30 p.m. Monday - Friday. Please note that voicemails left after 4:00 p.m. may not be returned until the following business day.  We are closed weekends and major holidays. You have access to a nurse at all times for urgent questions. Please call the main number to the clinic [phone] and follow the prompts.  For any non-urgent questions, you may also contact your provider using MyChart. We now offer e-Visits for anyone 68 and older to request care online for non-urgent symptoms. For details visit [email or web address].   Also download the MyChart app! Go to the app store, search "MyChart", open the app, select , and log in with your MyChart username and password.  Cisplatin Injection What is this medication? CISPLATIN (SIS pla tin) treats some types of cancer. It works by slowing down the growth of cancer cells. This medicine may be used for other purposes; ask your health care provider or pharmacist if you have questions. COMMON BRAND NAME(S): Platinol, Platinol -AQ What should I tell my care team before I take this medication? They need to know if you have any of these conditions: Eye disease, vision problems Hearing problems Kidney disease Low blood counts, such as low white cells, platelets, or red blood cells Tingling of the fingers or toes, or  other nerve disorder An unusual or allergic reaction to cisplatin, carboplatin, oxaliplatin, other medications, foods, dyes, or preservatives If you or your partner are pregnant or trying to get pregnant Breast-feeding How should I use this medication? This medication is injected into a vein. It is given by your care  team in a hospital or clinic setting. Talk to your care team about the use of this medication in children. Special care may be needed. Overdosage: If you think you have taken too much of this medicine contact a poison control center or emergency room at once. NOTE: This medicine is only for you. Do not share this medicine with others. What if I miss a dose? Keep appointments for follow-up doses. It is important not to miss your dose. Call your care team if you are unable to keep an appointment. What may interact with this medication? Do not take this medication with any of the following: Live virus vaccines This medication may also interact with the following: Certain antibiotics, such as amikacin, gentamicin, neomycin, polymyxin B, streptomycin, tobramycin, vancomycin Foscarnet This list may not describe all possible interactions. Give your health care provider a list of all the medicines, herbs, non-prescription drugs, or dietary supplements you use. Also tell them if you smoke, drink alcohol, or use illegal drugs. Some items may interact with your medicine. What should I watch for while using this medication? Your condition will be monitored carefully while you are receiving this medication. You may need blood work done while taking this medication. This medication may make you feel generally unwell. This is not uncommon, as chemotherapy can affect healthy cells as well as cancer cells. Report any side effects. Continue your course of treatment even though you feel ill unless your care team tells you to stop. This medication may increase your risk of getting an infection. Call your care team for advice if you get a fever, chills, sore throat, or other symptoms of a cold or flu. Do not treat yourself. Try to avoid being around people who are sick. Avoid taking medications that contain aspirin, acetaminophen, ibuprofen, naproxen, or ketoprofen unless instructed by your care team. These medications  may hide a fever. This medication may increase your risk to bruise or bleed. Call your care team if you notice any unusual bleeding. Be careful brushing or flossing your teeth or using a toothpick because you may get an infection or bleed more easily. If you have any dental work done, tell your dentist you are receiving this medication. Drink fluids as directed while you are taking this medication. This will help protect your kidneys. Call your care team if you get diarrhea. Do not treat yourself. Talk to your care team if you or your partner wish to become pregnant or think you might be pregnant. This medication can cause serious birth defects if taken during pregnancy and for 14 months after the last dose. A negative pregnancy test is required before starting this medication. A reliable form of contraception is recommended while taking this medication and for 14 months after the last dose. Talk to your care team about effective forms of contraception. Do not father a child while taking this medication and for 11 months after the last dose. Use a condom during sex during this time period. Do not breast-feed while taking this medication. This medication may cause infertility. Talk to your care team if you are concerned about your fertility. What side effects may I notice from receiving this medication? Side effects that you  should report to your care team as soon as possible: Allergic reactions--skin rash, itching, hives, swelling of the face, lips, tongue, or throat Eye pain, change in vision, vision loss Hearing loss, ringing in ears Infection--fever, chills, cough, sore throat, wounds that don't heal, pain or trouble when passing urine, general feeling of discomfort or being unwell Kidney injury--decrease in the amount of urine, swelling of the ankles, hands, or feet Low red blood cell level--unusual weakness or fatigue, dizziness, headache, trouble breathing Painful swelling, warmth, or redness  of the skin, blisters or sores at the infusion site Pain, tingling, or numbness in the hands or feet Unusual bruising or bleeding Side effects that usually do not require medical attention (report to your care team if they continue or are bothersome): Hair loss Nausea Vomiting This list may not describe all possible side effects. Call your doctor for medical advice about side effects. You may report side effects to FDA at 1-800-FDA-1088. Where should I keep my medication? This medication is given in a hospital or clinic. It will not be stored at home. NOTE: This sheet is a summary. It may not cover all possible information. If you have questions about this medicine, talk to your doctor, pharmacist, or health care provider.  2024 Elsevier/Gold Standard (2022-01-23 00:00:00)

## 2023-08-31 NOTE — Progress Notes (Signed)
Okay to run hydration fluids with cisplatin per Dr Orlie Dakin

## 2023-09-01 ENCOUNTER — Ambulatory Visit
Admission: RE | Admit: 2023-09-01 | Discharge: 2023-09-01 | Disposition: A | Discharge status: Home / Self Care | Payer: Medicare Other | Source: Ambulatory Visit | Attending: Radiation Oncology | Admitting: Radiation Oncology

## 2023-09-01 ENCOUNTER — Other Ambulatory Visit: Payer: Self-pay

## 2023-09-01 DIAGNOSIS — Z510 Encounter for antineoplastic radiation therapy: Secondary | ICD-10-CM | POA: Diagnosis not present

## 2023-09-01 DIAGNOSIS — C320 Malignant neoplasm of glottis: Secondary | ICD-10-CM | POA: Diagnosis not present

## 2023-09-01 LAB — RAD ONC ARIA SESSION SUMMARY
Course Elapsed Days: 8
Plan Fractions Treated to Date: 7
Plan Prescribed Dose Per Fraction: 2 Gy
Plan Total Fractions Prescribed: 35
Plan Total Prescribed Dose: 70 Gy
Reference Point Dosage Given to Date: 14 Gy
Reference Point Session Dosage Given: 2 Gy
Session Number: 7

## 2023-09-06 ENCOUNTER — Encounter: Payer: Self-pay | Admitting: Oncology

## 2023-09-06 ENCOUNTER — Ambulatory Visit
Admission: RE | Admit: 2023-09-06 | Discharge: 2023-09-06 | Disposition: A | Discharge status: Home / Self Care | Payer: Medicare Other | Source: Ambulatory Visit | Attending: Radiation Oncology | Admitting: Radiation Oncology

## 2023-09-06 ENCOUNTER — Other Ambulatory Visit: Payer: Self-pay

## 2023-09-06 ENCOUNTER — Inpatient Hospital Stay: Payer: Medicare Other | Attending: Oncology

## 2023-09-06 ENCOUNTER — Inpatient Hospital Stay (HOSPITAL_BASED_OUTPATIENT_CLINIC_OR_DEPARTMENT_OTHER): Payer: Medicare Other | Admitting: Oncology

## 2023-09-06 VITALS — BP 169/66 | HR 67 | Temp 98.4°F | Resp 15 | Wt 207.0 lb

## 2023-09-06 DIAGNOSIS — Z87891 Personal history of nicotine dependence: Secondary | ICD-10-CM | POA: Diagnosis not present

## 2023-09-06 DIAGNOSIS — I10 Essential (primary) hypertension: Secondary | ICD-10-CM | POA: Diagnosis not present

## 2023-09-06 DIAGNOSIS — E876 Hypokalemia: Secondary | ICD-10-CM | POA: Insufficient documentation

## 2023-09-06 DIAGNOSIS — F410 Panic disorder [episodic paroxysmal anxiety] without agoraphobia: Secondary | ICD-10-CM | POA: Insufficient documentation

## 2023-09-06 DIAGNOSIS — Z79899 Other long term (current) drug therapy: Secondary | ICD-10-CM | POA: Diagnosis not present

## 2023-09-06 DIAGNOSIS — R1310 Dysphagia, unspecified: Secondary | ICD-10-CM | POA: Diagnosis not present

## 2023-09-06 DIAGNOSIS — C320 Malignant neoplasm of glottis: Secondary | ICD-10-CM

## 2023-09-06 DIAGNOSIS — Z510 Encounter for antineoplastic radiation therapy: Secondary | ICD-10-CM | POA: Insufficient documentation

## 2023-09-06 DIAGNOSIS — R059 Cough, unspecified: Secondary | ICD-10-CM | POA: Diagnosis not present

## 2023-09-06 DIAGNOSIS — Z5111 Encounter for antineoplastic chemotherapy: Secondary | ICD-10-CM | POA: Diagnosis not present

## 2023-09-06 DIAGNOSIS — D696 Thrombocytopenia, unspecified: Secondary | ICD-10-CM | POA: Insufficient documentation

## 2023-09-06 LAB — CBC WITH DIFFERENTIAL (CANCER CENTER ONLY)
Abs Immature Granulocytes: 0.03 10*3/uL (ref 0.00–0.07)
Basophils Absolute: 0 10*3/uL (ref 0.0–0.1)
Basophils Relative: 1 %
Eosinophils Absolute: 0.1 10*3/uL (ref 0.0–0.5)
Eosinophils Relative: 1 %
HCT: 38.9 % — ABNORMAL LOW (ref 39.0–52.0)
Hemoglobin: 13.4 g/dL (ref 13.0–17.0)
Immature Granulocytes: 0 %
Lymphocytes Relative: 17 %
Lymphs Abs: 1.3 10*3/uL (ref 0.7–4.0)
MCH: 31 pg (ref 26.0–34.0)
MCHC: 34.4 g/dL (ref 30.0–36.0)
MCV: 90 fL (ref 80.0–100.0)
Monocytes Absolute: 0.6 10*3/uL (ref 0.1–1.0)
Monocytes Relative: 8 %
Neutro Abs: 5.6 10*3/uL (ref 1.7–7.7)
Neutrophils Relative %: 73 %
Platelet Count: 164 10*3/uL (ref 150–400)
RBC: 4.32 MIL/uL (ref 4.22–5.81)
RDW: 12.3 % (ref 11.5–15.5)
WBC Count: 7.6 10*3/uL (ref 4.0–10.5)
nRBC: 0 % (ref 0.0–0.2)

## 2023-09-06 LAB — RAD ONC ARIA SESSION SUMMARY
Course Elapsed Days: 13
Plan Fractions Treated to Date: 8
Plan Prescribed Dose Per Fraction: 2 Gy
Plan Total Fractions Prescribed: 35
Plan Total Prescribed Dose: 70 Gy
Reference Point Dosage Given to Date: 16 Gy
Reference Point Session Dosage Given: 2 Gy
Session Number: 8

## 2023-09-06 LAB — BASIC METABOLIC PANEL - CANCER CENTER ONLY
Anion gap: 10 (ref 5–15)
BUN: 10 mg/dL (ref 8–23)
CO2: 26 mmol/L (ref 22–32)
Calcium: 8.8 mg/dL — ABNORMAL LOW (ref 8.9–10.3)
Chloride: 98 mmol/L (ref 98–111)
Creatinine: 0.83 mg/dL (ref 0.61–1.24)
GFR, Estimated: >60 mL/min (ref >60)
Glucose, Bld: 118 mg/dL — ABNORMAL HIGH (ref 70–99)
Potassium: 3.9 mmol/L (ref 3.5–5.1)
Sodium: 134 mmol/L — ABNORMAL LOW (ref 135–145)

## 2023-09-06 LAB — MAGNESIUM: Magnesium: 1.9 mg/dL (ref 1.7–2.4)

## 2023-09-06 MED FILL — Fosaprepitant Dimeglumine For IV Infusion 150 MG (Base Eq): INTRAVENOUS | Qty: 5 | Status: AC

## 2023-09-06 NOTE — Progress Notes (Signed)
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  INTERVAL HISTORY: Patient returns to clinic today for repeat laboratory work, further evaluation, and consideration of cycle 3 of weekly cisplatin along with his daily XRT.  He continues to have dysphagia.  He also complains of occasional cough secondary to increased phlegm production.  He otherwise is tolerating his treatments well.  He has no neurologic complaints.  He denies any recent fevers or illnesses.  He has a good appetite and denies weight loss.  He has no chest pain, shortness of breath, or hemoptysis.  He denies any nausea, vomiting, constipation, or diarrhea.  He has no urinary complaints.  Patient offers no further specific complaints today.  REVIEW OF SYSTEMS:   Review of Systems  Constitutional: Negative.  Negative for fever, malaise/fatigue and weight loss.  Respiratory:  Positive for cough. Negative for hemoptysis and shortness of breath.   Cardiovascular: Negative.  Negative for chest pain and leg swelling.  Gastrointestinal: Negative.  Negative for abdominal pain.  Genitourinary: Negative.  Negative for dysuria.  Musculoskeletal: Negative.  Negative for back pain.  Skin: Negative.  Negative for rash.  Neurological: Negative.  Negative for dizziness, focal weakness, weakness and headaches.  Psychiatric/Behavioral: Negative.  The patient is not nervous/anxious.     As per HPI. Otherwise, a complete review of systems is negative.  PAST MEDICAL HISTORY: Past Medical History:  Diagnosis Date   AAA (abdominal aortic aneurysm) (HCC)    a. 2012 3.4 cm; b. 07/2017 Abd u/s: Dist abd Ao dil up to 4.8cm; c. 03/2018 CT Abd:  infrarenal AAA 5.3 x 5.7cm; d. 06/2018 EVAR (28mm prox/68mm dist, 18cm length Gore Excluder Endoprosthesis).   Arthritis    CAD (coronary artery disease)    a. 2007 Inf MI w/ BMS to RCA x 2; b. 2008 PCI/DES to mid LAD @ [facility]; c. 07/2015 Cath: LAD 100 prox to prev placed stent w/ R->L collats, RCA stents patent. EF 50-55%. Med Rx; d. 04/2018 MV: EF 48%, mid-dist ant/apical/apical-lat ischemia->med rx.   Cataracts, bilateral    COPD (chronic obstructive pulmonary disease) (HCC)    Diastolic dysfunction    a. 02/2014 Echo: EF 55-65%. Gr1 DD, no rwma, mildly dil LA, nl RV fxn; b. 01/2018 Echo: EF 60-65%, Gr1 DD, mild MR, mildly dil LA. nl RV fxn. Nl PASP.   ETOH abuse    Grade I diastolic dysfunction    Hepatic steatosis    a. 03/2018 CT Abd: hepatic steatosis and possible early cirrhotic morphology of liver.   History of heart attack    History of kidney stones    Hyperlipidemia    Hypertension    MI (myocardial infarction) (HCC)    Mild mitral regurgitation by prior echocardiogram    Obesity    Panic disorder    Panic disorder    Status post AAA (abdominal aortic aneurysm) repair    Tobacco abuse     PAST SURGICAL HISTORY: Past Surgical History:  Procedure Laterality Date   25 GAUGE PARS PLANA VITRECTOMY WITH 20 GAUGE MVR PORT FOR MACULAR HOLE Right 01/14/2021   Procedure: 25 GAUGE PARS PLANA VITRECTOMY WITH 20 GAUGE MVR PORT FOR MACULAR HOLE;  Surgeon: Sherrie George, MD;  Location: [facility] OR;  Service: Ophthalmology;  Laterality: Right;   AIR/FLUID EXCHANGE Right 01/14/2021   Procedure: AIR/FLUID EXCHANGE;  Surgeon: Sherrie George, MD;  Location: [facility] OR;  Service: Ophthalmology;  Laterality: Right;   BACK SURGERY     CARDIAC CATHETERIZATION  08/2006   x1 stent [facility]. Mid RCA: 3.5 x 16 mm overlap with 3.5 x 24 mm Liberte bare-metal stents   CARDIAC CATHETERIZATION  10/2006   x1 stent [facility]: Mid LAD: 2.5 x 28 mm Cypher drug-eluting   CARDIAC CATHETERIZATION N/A 07/25/2015   Procedure: Left  Heart Cath and Coronary Angiography;  Surgeon: Iran Ouch, MD;  Location: [facility] INVASIVE CV LAB;  Service: Cardiovascular;  Laterality: N/A;   Cataract surgery Bilateral    COLONOSCOPY     coronary stents     ENDOVASCULAR REPAIR/STENT GRAFT N/A 06/08/2018   Procedure: ENDOVASCULAR REPAIR/STENT GRAFT;  Surgeon: Annice Needy, MD;  Location: [facility] INVASIVE CV LAB;  Service: Cardiovascular;  Laterality: N/A;   GAS INSERTION Right 01/14/2021   Procedure: INSERTION OF GAS;  Surgeon: Sherrie George, MD;  Location: [facility] OR;  Service: Ophthalmology;  Laterality: Right;   GAS/FLUID EXCHANGE Right 01/14/2021   Procedure: GAS/FLUID EXCHANGE;  Surgeon: Sherrie George, MD;  Location: [facility] OR;  Service: Ophthalmology;  Laterality: Right;   MEMBRANE PEEL Right 01/14/2021   Procedure: MEMBRANE PEEL;  Surgeon: Sherrie George, MD;  Location: [facility] OR;  Service: Ophthalmology;  Laterality: Right;   MICRODISCECTOMY LUMBAR     MICROLARYNGOSCOPY Bilateral 07/20/2023   Procedure: MICRODIRECT LARYNGOSCOPY WITH BIOPSY OF VOCAL CORD LESION;  Surgeon: Linus Salmons, MD;  Location: [facility] ORS;  Service: ENT;  Laterality: Bilateral;   PHOTOCOAGULATION WITH LASER Right 01/14/2021   Procedure: PHOTOCOAGULATION WITH LASER;  Surgeon: Sherrie George, MD;  Location: [facility] OR;  Service: Ophthalmology;  Laterality: Right;   SERUM PATCH Right 01/14/2021   Procedure: SERUM PATCH;  Surgeon: Sherrie George, MD;  Location: [facility] OR;  Service: Ophthalmology;  Laterality: Right;    FAMILY HISTORY: Family History  Problem Relation Age of Onset   Hypertension Mother    Heart disease Father     ADVANCED DIRECTIVES (Y/N):  N  HEALTH MAINTENANCE: Social History   Tobacco Use   Smoking status: Former    Current packs/day: 0.00    Average packs/day: 2.0 packs/day for 50.0 years (100.0 ttl pk-yrs)    Types: Cigarettes    Quit date: 07/20/2023    Years since quitting: 0.1   Smokeless tobacco: Never  Vaping Use   Vaping  status: Never Used  Substance Use Topics   Alcohol use: Yes    Alcohol/week: 84.0 standard drinks of alcohol    Types: 84 Cans of beer per week    Comment: daily   Drug use: No     Colonoscopy:  PAP:  Bone density:  Lipid panel:  No Known Allergies  Current Outpatient Medications  Medication Sig Dispense Refill   albuterol (VENTOLIN HFA) 108 (90 Base) MCG/ACT inhaler Inhale 2 puffs into the lungs every 4 (four) hours as needed for wheezing or shortness of breath (cough, shortness of breath or wheezing.). 1 each 1   ALPRAZolam (XANAX) 0.5 MG tablet Take 0.5 mg by mouth 4 (four) times daily as needed for anxiety.     aspirin EC 81 MG tablet Take 1 tablet (81 mg total) by mouth daily. 90 tablet 3   atorvastatin (LIPITOR) 40 MG tablet Take 1 tablet (40 mg total) by mouth daily.  30 tablet 1   enalapril (VASOTEC) 20 MG tablet Take 1 tablet (20 mg total) by mouth 2 (two) times daily. 60 tablet 3   Multiple Vitamins-Minerals (PRESERVISION AREDS 2) CAPS Take 1 tablet by mouth 2 (two) times daily.     nitroGLYCERIN (NITROSTAT) 0.4 MG SL tablet Place 1 tablet (0.4 mg total) under the tongue every 5 (five) minutes as needed for chest pain. 25 tablet 1   sucralfate (CARAFATE) 1 g tablet Take 1 tablet (1 g total) by mouth 4 (four) times daily. 120 tablet 0   torsemide (DEMADEX) 20 MG tablet Take 20 mg by mouth daily as needed.     ondansetron (ZOFRAN) 8 MG tablet Take 1 tablet (8 mg total) by mouth every 8 (eight) hours as needed for nausea or vomiting. Start on the third day after cisplatin. (Patient not taking: Reported on 08/23/2023) 60 tablet 1   prochlorperazine (COMPAZINE) 10 MG tablet Take 1 tablet (10 mg total) by mouth every 6 (six) hours as needed (Nausea or vomiting). (Patient not taking: Reported on 08/23/2023) 60 tablet 1   No current facility-administered medications for this visit.    OBJECTIVE: Vitals:   09/06/23 0859  BP: (!) 169/66  Pulse: 67  Resp: 15  Temp: 98.4 F (36.9  C)  SpO2: 96%     Body mass index is 27.31 kg/m.    ECOG FS:0 - Asymptomatic  General: Well-developed, well-nourished, no acute distress. Eyes: Pink conjunctiva, anicteric sclera. HEENT: Normocephalic, moist mucous membranes.  No palpable lymphadenopathy. Lungs: No audible wheezing or coughing. Heart: Regular rate and rhythm. Abdomen: Soft, nontender, no obvious distention. Musculoskeletal: No edema, cyanosis, or clubbing. Neuro: Alert, answering all questions appropriately. Cranial nerves grossly intact. Skin: No rashes or petechiae noted. Psych: Normal affect.  LAB RESULTS:  Lab Results  Component Value Date   NA 134 (L) 09/06/2023   K 3.9 09/06/2023   CL 98 09/06/2023   CO2 26 09/06/2023   GLUCOSE 118 (H) 09/06/2023   BUN 10 09/06/2023   CREATININE 0.83 09/06/2023   CALCIUM 8.8 (L) 09/06/2023   PROT 6.2 (L) 01/14/2021   ALBUMIN 3.8 01/14/2021   AST 22 01/14/2021   ALT 22 01/14/2021   ALKPHOS 63 01/14/2021   BILITOT 1.2 01/14/2021   GFRNONAA >60 09/06/2023   GFRAA >60 06/09/2018    Lab Results  Component Value Date   WBC 7.6 09/06/2023   NEUTROABS 5.6 09/06/2023   HGB 13.4 09/06/2023   HCT 38.9 (L) 09/06/2023   MCV 90.0 09/06/2023   PLT 164 09/06/2023     STUDIES: No results found.  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  PET scan results from August 02, 2023 reviewed independently and reported as above confirming stage of disease.  Case was discussed at tumor board and although total laryngectomy should be considered given the significant risk of chronic aspiration, patient adamantly refuses surgical intervention.  Therefore, will proceed with concurrent chemotherapy and XRT using weekly cisplatin.  Proceed with cycle 3 of treatment tomorrow.  Continue daily XRT.  Return to clinic in 1 week for further  evaluation and consideration of cycle 4.   Hypermetabolic prostate lesion: Although patient's PSA is only 3.73, this is highly suspicious for underlying malignancy.  Patient was seen by urology who plans to do a biopsy at the conclusion of his treatments for his  head and neck cancer.   Dysphagia: Continue Carafate as prescribed. Cough: Recommended patient take OTC guaifenesin. Panic attacks: Patient has been instructed to call his primary care regarding his Xanax dosing.  Possibly keeping the same dose until he completes his treatments.  Patient expressed understanding and was in agreement with this plan. He also understands that He can call clinic at any time with any questions, concerns, or complaints.    Cancer Staging  Squamous cell carcinoma of right vocal cord [facility]) Staging form: Larynx - Glottis, AJCC 8th Edition - Clinical stage from 08/06/2023: Stage III (cT3, cN0, cM0) - Signed by Jeralyn Ruths, MD on 08/06/2023 Stage prefix: Initial diagnosis   Jeralyn Ruths, MD   09/06/2023 3:24 PM

## 2023-09-07 ENCOUNTER — Ambulatory Visit
Admission: RE | Admit: 2023-09-07 | Discharge: 2023-09-07 | Disposition: A | Discharge status: Home / Self Care | Payer: Medicare Other | Source: Ambulatory Visit | Attending: Radiation Oncology | Admitting: Radiation Oncology

## 2023-09-07 ENCOUNTER — Other Ambulatory Visit: Payer: Self-pay

## 2023-09-07 ENCOUNTER — Inpatient Hospital Stay: Payer: Medicare Other

## 2023-09-07 VITALS — BP 160/69 | HR 66 | Temp 98.2°F | Resp 19

## 2023-09-07 DIAGNOSIS — Z510 Encounter for antineoplastic radiation therapy: Secondary | ICD-10-CM | POA: Diagnosis not present

## 2023-09-07 DIAGNOSIS — F410 Panic disorder [episodic paroxysmal anxiety] without agoraphobia: Secondary | ICD-10-CM | POA: Diagnosis not present

## 2023-09-07 DIAGNOSIS — R1310 Dysphagia, unspecified: Secondary | ICD-10-CM | POA: Diagnosis not present

## 2023-09-07 DIAGNOSIS — Z79899 Other long term (current) drug therapy: Secondary | ICD-10-CM | POA: Diagnosis not present

## 2023-09-07 DIAGNOSIS — E876 Hypokalemia: Secondary | ICD-10-CM | POA: Diagnosis not present

## 2023-09-07 DIAGNOSIS — D696 Thrombocytopenia, unspecified: Secondary | ICD-10-CM | POA: Diagnosis not present

## 2023-09-07 DIAGNOSIS — Z5111 Encounter for antineoplastic chemotherapy: Secondary | ICD-10-CM | POA: Diagnosis not present

## 2023-09-07 DIAGNOSIS — C320 Malignant neoplasm of glottis: Secondary | ICD-10-CM

## 2023-09-07 DIAGNOSIS — R059 Cough, unspecified: Secondary | ICD-10-CM | POA: Diagnosis not present

## 2023-09-07 DIAGNOSIS — Z87891 Personal history of nicotine dependence: Secondary | ICD-10-CM | POA: Diagnosis not present

## 2023-09-07 DIAGNOSIS — I10 Essential (primary) hypertension: Secondary | ICD-10-CM | POA: Diagnosis not present

## 2023-09-07 LAB — RAD ONC ARIA SESSION SUMMARY
Course Elapsed Days: 14
Plan Fractions Treated to Date: 9
Plan Prescribed Dose Per Fraction: 2 Gy
Plan Total Fractions Prescribed: 35
Plan Total Prescribed Dose: 70 Gy
Reference Point Dosage Given to Date: 18 Gy
Reference Point Session Dosage Given: 2 Gy
Session Number: 9

## 2023-09-07 MED ORDER — DEXAMETHASONE SODIUM PHOSPHATE 10 MG/ML IJ SOLN
10.0000 mg | Freq: Once | INTRAMUSCULAR | Status: AC
  Administered 2023-09-07: 10 mg via INTRAVENOUS
  Filled 2023-09-07: qty 1

## 2023-09-07 MED ORDER — POTASSIUM CHLORIDE IN NACL 20-0.9 MEQ/L-% IV SOLN
Freq: Once | INTRAVENOUS | Status: AC
Start: 2023-09-07 — End: 2023-09-07
  Filled 2023-09-07: qty 1000

## 2023-09-07 MED ORDER — MAGNESIUM SULFATE 2 GM/50ML IV SOLN
2.0000 g | Freq: Once | INTRAVENOUS | Status: AC
  Administered 2023-09-07: 2 g via INTRAVENOUS
  Filled 2023-09-07: qty 50

## 2023-09-07 MED ORDER — FOSAPREPITANT DIMEGLUMINE INJECTION 150 MG
150.0000 mg | Freq: Once | INTRAVENOUS | Status: AC
  Administered 2023-09-07: 150 mg via INTRAVENOUS
  Filled 2023-09-07: qty 150

## 2023-09-07 MED ORDER — SODIUM CHLORIDE 0.9 % IV SOLN
40.0000 mg/m2 | Freq: Once | INTRAVENOUS | Status: AC
  Administered 2023-09-07: 90 mg via INTRAVENOUS
  Filled 2023-09-07: qty 90

## 2023-09-07 MED ORDER — PALONOSETRON HCL INJECTION 0.25 MG/5ML
0.2500 mg | Freq: Once | INTRAVENOUS | Status: AC
  Administered 2023-09-07: 0.25 mg via INTRAVENOUS
  Filled 2023-09-07: qty 5

## 2023-09-07 MED ORDER — HEPARIN SOD (PORK) LOCK FLUSH 100 UNIT/ML IV SOLN
500.0000 [IU] | Freq: Once | INTRAVENOUS | Status: DC | PRN
Start: 2023-09-07 — End: 2023-09-07
  Filled 2023-09-07: qty 5

## 2023-09-07 MED ORDER — SODIUM CHLORIDE 0.9 % IV SOLN
INTRAVENOUS | Status: DC
  Filled 2023-09-07: qty 250

## 2023-09-08 ENCOUNTER — Other Ambulatory Visit: Payer: Self-pay

## 2023-09-08 ENCOUNTER — Ambulatory Visit
Admission: RE | Admit: 2023-09-08 | Discharge: 2023-09-08 | Disposition: A | Discharge status: Home / Self Care | Payer: Medicare Other | Source: Ambulatory Visit | Attending: Radiation Oncology | Admitting: Radiation Oncology

## 2023-09-08 DIAGNOSIS — Z510 Encounter for antineoplastic radiation therapy: Secondary | ICD-10-CM | POA: Diagnosis not present

## 2023-09-08 DIAGNOSIS — C320 Malignant neoplasm of glottis: Secondary | ICD-10-CM | POA: Diagnosis not present

## 2023-09-08 LAB — RAD ONC ARIA SESSION SUMMARY
Course Elapsed Days: 15
Plan Fractions Treated to Date: 10
Plan Prescribed Dose Per Fraction: 2 Gy
Plan Total Fractions Prescribed: 35
Plan Total Prescribed Dose: 70 Gy
Reference Point Dosage Given to Date: 20 Gy
Reference Point Session Dosage Given: 2 Gy
Session Number: 10

## 2023-09-09 ENCOUNTER — Other Ambulatory Visit: Payer: Self-pay

## 2023-09-09 ENCOUNTER — Ambulatory Visit
Admission: RE | Admit: 2023-09-09 | Discharge: 2023-09-09 | Disposition: A | Discharge status: Home / Self Care | Payer: Medicare Other | Source: Ambulatory Visit | Attending: Radiation Oncology | Admitting: Radiation Oncology

## 2023-09-09 DIAGNOSIS — C320 Malignant neoplasm of glottis: Secondary | ICD-10-CM | POA: Diagnosis not present

## 2023-09-09 DIAGNOSIS — Z510 Encounter for antineoplastic radiation therapy: Secondary | ICD-10-CM | POA: Diagnosis not present

## 2023-09-09 LAB — RAD ONC ARIA SESSION SUMMARY
Course Elapsed Days: 16
Plan Fractions Treated to Date: 11
Plan Prescribed Dose Per Fraction: 2 Gy
Plan Total Fractions Prescribed: 35
Plan Total Prescribed Dose: 70 Gy
Reference Point Dosage Given to Date: 22 Gy
Reference Point Session Dosage Given: 2 Gy
Session Number: 11

## 2023-09-10 ENCOUNTER — Ambulatory Visit
Admission: RE | Admit: 2023-09-10 | Discharge: 2023-09-10 | Disposition: A | Discharge status: Home / Self Care | Payer: Medicare Other | Source: Ambulatory Visit | Attending: Radiation Oncology | Admitting: Radiation Oncology

## 2023-09-10 ENCOUNTER — Other Ambulatory Visit: Payer: Self-pay

## 2023-09-10 DIAGNOSIS — C320 Malignant neoplasm of glottis: Secondary | ICD-10-CM | POA: Diagnosis not present

## 2023-09-10 DIAGNOSIS — Z510 Encounter for antineoplastic radiation therapy: Secondary | ICD-10-CM | POA: Diagnosis not present

## 2023-09-10 LAB — RAD ONC ARIA SESSION SUMMARY
Course Elapsed Days: 17
Plan Fractions Treated to Date: 12
Plan Prescribed Dose Per Fraction: 2 Gy
Plan Total Fractions Prescribed: 35
Plan Total Prescribed Dose: 70 Gy
Reference Point Dosage Given to Date: 24 Gy
Reference Point Session Dosage Given: 2 Gy
Session Number: 12

## 2023-09-13 ENCOUNTER — Ambulatory Visit
Admission: RE | Admit: 2023-09-13 | Discharge: 2023-09-13 | Disposition: A | Discharge status: Home / Self Care | Payer: Medicare Other | Source: Ambulatory Visit | Attending: Radiation Oncology | Admitting: Radiation Oncology

## 2023-09-13 ENCOUNTER — Other Ambulatory Visit: Payer: Self-pay

## 2023-09-13 ENCOUNTER — Encounter: Payer: Self-pay | Admitting: Oncology

## 2023-09-13 ENCOUNTER — Inpatient Hospital Stay: Payer: Medicare Other

## 2023-09-13 ENCOUNTER — Inpatient Hospital Stay (HOSPITAL_BASED_OUTPATIENT_CLINIC_OR_DEPARTMENT_OTHER): Payer: Medicare Other | Admitting: Oncology

## 2023-09-13 VITALS — BP 130/80 | HR 78 | Temp 97.7°F | Resp 16 | Wt 192.0 lb

## 2023-09-13 DIAGNOSIS — R059 Cough, unspecified: Secondary | ICD-10-CM | POA: Diagnosis not present

## 2023-09-13 DIAGNOSIS — C320 Malignant neoplasm of glottis: Secondary | ICD-10-CM

## 2023-09-13 DIAGNOSIS — E876 Hypokalemia: Secondary | ICD-10-CM | POA: Diagnosis not present

## 2023-09-13 DIAGNOSIS — Z79899 Other long term (current) drug therapy: Secondary | ICD-10-CM | POA: Diagnosis not present

## 2023-09-13 DIAGNOSIS — F410 Panic disorder [episodic paroxysmal anxiety] without agoraphobia: Secondary | ICD-10-CM | POA: Diagnosis not present

## 2023-09-13 DIAGNOSIS — R1310 Dysphagia, unspecified: Secondary | ICD-10-CM | POA: Diagnosis not present

## 2023-09-13 DIAGNOSIS — D696 Thrombocytopenia, unspecified: Secondary | ICD-10-CM | POA: Diagnosis not present

## 2023-09-13 DIAGNOSIS — Z510 Encounter for antineoplastic radiation therapy: Secondary | ICD-10-CM | POA: Diagnosis not present

## 2023-09-13 DIAGNOSIS — I10 Essential (primary) hypertension: Secondary | ICD-10-CM | POA: Diagnosis not present

## 2023-09-13 DIAGNOSIS — Z87891 Personal history of nicotine dependence: Secondary | ICD-10-CM | POA: Diagnosis not present

## 2023-09-13 DIAGNOSIS — Z5111 Encounter for antineoplastic chemotherapy: Secondary | ICD-10-CM | POA: Diagnosis not present

## 2023-09-13 LAB — CBC WITH DIFFERENTIAL (CANCER CENTER ONLY)
Abs Immature Granulocytes: 0.02 10*3/uL (ref 0.00–0.07)
Basophils Absolute: 0 10*3/uL (ref 0.0–0.1)
Basophils Relative: 1 %
Eosinophils Absolute: 0 10*3/uL (ref 0.0–0.5)
Eosinophils Relative: 0 %
HCT: 40.7 % (ref 39.0–52.0)
Hemoglobin: 13.8 g/dL (ref 13.0–17.0)
Immature Granulocytes: 0 %
Lymphocytes Relative: 15 %
Lymphs Abs: 1 10*3/uL (ref 0.7–4.0)
MCH: 30.9 pg (ref 26.0–34.0)
MCHC: 33.9 g/dL (ref 30.0–36.0)
MCV: 91.3 fL (ref 80.0–100.0)
Monocytes Absolute: 0.5 10*3/uL (ref 0.1–1.0)
Monocytes Relative: 7 %
Neutro Abs: 5 10*3/uL (ref 1.7–7.7)
Neutrophils Relative %: 77 %
Platelet Count: 172 10*3/uL (ref 150–400)
RBC: 4.46 MIL/uL (ref 4.22–5.81)
RDW: 12.6 % (ref 11.5–15.5)
WBC Count: 6.5 10*3/uL (ref 4.0–10.5)
nRBC: 0 % (ref 0.0–0.2)

## 2023-09-13 LAB — RAD ONC ARIA SESSION SUMMARY
Course Elapsed Days: 20
Plan Fractions Treated to Date: 13
Plan Prescribed Dose Per Fraction: 2 Gy
Plan Total Fractions Prescribed: 35
Plan Total Prescribed Dose: 70 Gy
Reference Point Dosage Given to Date: 26 Gy
Reference Point Session Dosage Given: 2 Gy
Session Number: 13

## 2023-09-13 LAB — BASIC METABOLIC PANEL - CANCER CENTER ONLY
Anion gap: 14 (ref 5–15)
BUN: 18 mg/dL (ref 8–23)
CO2: 30 mmol/L (ref 22–32)
Calcium: 9.3 mg/dL (ref 8.9–10.3)
Chloride: 93 mmol/L — ABNORMAL LOW (ref 98–111)
Creatinine: 1.13 mg/dL (ref 0.61–1.24)
GFR, Estimated: >60 mL/min (ref >60)
Glucose, Bld: 128 mg/dL — ABNORMAL HIGH (ref 70–99)
Potassium: 3.4 mmol/L — ABNORMAL LOW (ref 3.5–5.1)
Sodium: 137 mmol/L (ref 135–145)

## 2023-09-13 LAB — MAGNESIUM: Magnesium: 1.9 mg/dL (ref 1.7–2.4)

## 2023-09-13 MED FILL — Fosaprepitant Dimeglumine For IV Infusion 150 MG (Base Eq): INTRAVENOUS | Qty: 5 | Status: AC

## 2023-09-13 NOTE — Progress Notes (Signed)
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  INTERVAL HISTORY: Patient returns to clinic today for repeat laboratory, further evaluation, consideration of cycle 4 of weekly cisplatin along with his daily XRT.  He does not complain of dysphagia today.  He continues to have chronic cough.  He otherwise is tolerating his treatment well. He has no neurologic complaints.  He denies any recent fevers or illnesses.  He has a good appetite and denies weight loss.  He has no chest pain, shortness of breath, or hemoptysis.  He denies any nausea, vomiting, constipation, or diarrhea.  He has no urinary complaints.  Patient offers no further specific complaints today.  REVIEW OF SYSTEMS:   Review of Systems  Constitutional: Negative.  Negative for fever, malaise/fatigue and weight loss.  Respiratory:  Positive for cough. Negative for hemoptysis and shortness of breath.   Cardiovascular: Negative.  Negative for chest pain and leg swelling.  Gastrointestinal: Negative.  Negative for abdominal pain.  Genitourinary: Negative.  Negative for dysuria.  Musculoskeletal: Negative.  Negative for back pain.  Skin: Negative.  Negative for rash.  Neurological: Negative.  Negative for dizziness, focal weakness, weakness and headaches.  Psychiatric/Behavioral: Negative.  The patient is not nervous/anxious.     As per HPI. Otherwise, a complete review of systems is negative.  PAST MEDICAL HISTORY: Past Medical History:  Diagnosis Date   AAA (abdominal aortic aneurysm) (HCC)    a. 2012 3.4 cm; b. 07/2017 Abd u/s: Dist abd Ao dil up to 4.8cm; c. 03/2018 CT Abd: infrarenal AAA 5.3 x 5.7cm; d. 06/2018 EVAR  (28mm prox/47mm dist, 18cm length Gore Excluder Endoprosthesis).   Arthritis    CAD (coronary artery disease)    a. 2007 Inf MI w/ BMS to RCA x 2; b. 2008 PCI/DES to mid LAD @ [facility]; c. 07/2015 Cath: LAD 100 prox to prev placed stent w/ R->L collats, RCA stents patent. EF 50-55%. Med Rx; d. 04/2018 MV: EF 48%, mid-dist ant/apical/apical-lat ischemia->med rx.   Cataracts, bilateral    COPD (chronic obstructive pulmonary disease) (HCC)    Diastolic dysfunction    a. 02/2014 Echo: EF 55-65%. Gr1 DD, no rwma, mildly dil LA, nl RV fxn; b. 01/2018 Echo: EF 60-65%, Gr1 DD, mild MR, mildly dil LA. nl RV fxn. Nl PASP.   ETOH abuse    Grade I diastolic dysfunction    Hepatic steatosis    a. 03/2018 CT Abd: hepatic steatosis and possible early cirrhotic morphology of liver.   History of heart attack    History of kidney stones    Hyperlipidemia    Hypertension    MI (myocardial infarction) (HCC)    Mild mitral regurgitation by prior echocardiogram    Obesity    Panic disorder    Panic disorder    Status post AAA (abdominal aortic aneurysm) repair    Tobacco abuse     PAST SURGICAL HISTORY: Past Surgical History:  Procedure Laterality Date   25 GAUGE PARS PLANA VITRECTOMY WITH 20 GAUGE MVR PORT FOR MACULAR HOLE Right 01/14/2021   Procedure: 25 GAUGE PARS PLANA VITRECTOMY WITH 20 GAUGE MVR PORT FOR MACULAR HOLE;  Surgeon: Sherrie George, MD;  Location: [facility] OR;  Service: Ophthalmology;  Laterality: Right;   AIR/FLUID EXCHANGE Right 01/14/2021   Procedure: AIR/FLUID EXCHANGE;  Surgeon: Sherrie George, MD;  Location: [facility] OR;  Service: Ophthalmology;  Laterality: Right;   BACK SURGERY     CARDIAC CATHETERIZATION  08/2006   x1 stent [facility]. Mid RCA: 3.5 x 16 mm overlap with 3.5 x 24 mm Liberte bare-metal stents   CARDIAC CATHETERIZATION  10/2006   x1 stent [facility]: Mid LAD: 2.5 x 28 mm Cypher drug-eluting   CARDIAC CATHETERIZATION N/A 07/25/2015   Procedure: Left Heart Cath and Coronary Angiography;   Surgeon: Iran Ouch, MD;  Location: [facility] INVASIVE CV LAB;  Service: Cardiovascular;  Laterality: N/A;   Cataract surgery Bilateral    COLONOSCOPY     coronary stents     ENDOVASCULAR REPAIR/STENT GRAFT N/A 06/08/2018   Procedure: ENDOVASCULAR REPAIR/STENT GRAFT;  Surgeon: Annice Needy, MD;  Location: [facility] INVASIVE CV LAB;  Service: Cardiovascular;  Laterality: N/A;   GAS INSERTION Right 01/14/2021   Procedure: INSERTION OF GAS;  Surgeon: Sherrie George, MD;  Location: [facility] OR;  Service: Ophthalmology;  Laterality: Right;   GAS/FLUID EXCHANGE Right 01/14/2021   Procedure: GAS/FLUID EXCHANGE;  Surgeon: Sherrie George, MD;  Location: [facility] OR;  Service: Ophthalmology;  Laterality: Right;   MEMBRANE PEEL Right 01/14/2021   Procedure: MEMBRANE PEEL;  Surgeon: Sherrie George, MD;  Location: [facility] OR;  Service: Ophthalmology;  Laterality: Right;   MICRODISCECTOMY LUMBAR     MICROLARYNGOSCOPY Bilateral 07/20/2023   Procedure: MICRODIRECT LARYNGOSCOPY WITH BIOPSY OF VOCAL CORD LESION;  Surgeon: Linus Salmons, MD;  Location: [facility] ORS;  Service: ENT;  Laterality: Bilateral;   PHOTOCOAGULATION WITH LASER Right 01/14/2021   Procedure: PHOTOCOAGULATION WITH LASER;  Surgeon: Sherrie George, MD;  Location: [facility] OR;  Service: Ophthalmology;  Laterality: Right;   SERUM PATCH Right 01/14/2021   Procedure: SERUM PATCH;  Surgeon: Sherrie George, MD;  Location: [facility] OR;  Service: Ophthalmology;  Laterality: Right;    FAMILY HISTORY: Family History  Problem Relation Age of Onset   Hypertension Mother    Heart disease Father     ADVANCED DIRECTIVES (Y/N):  N  HEALTH MAINTENANCE: Social History   Tobacco Use   Smoking status: Former    Current packs/day: 0.00    Average packs/day: 2.0 packs/day for 50.0 years (100.0 ttl pk-yrs)    Types: Cigarettes    Quit date: 07/20/2023    Years since quitting: 0.1   Smokeless tobacco: Never  Vaping Use   Vaping status: Never Used  Substance Use Topics    Alcohol use: Yes    Alcohol/week: 84.0 standard drinks of alcohol    Types: 84 Cans of beer per week    Comment: daily   Drug use: No     Colonoscopy:  PAP:  Bone density:  Lipid panel:  No Known Allergies  Current Outpatient Medications  Medication Sig Dispense Refill   ondansetron (ZOFRAN) 8 MG tablet Take 1 tablet (8 mg total) by mouth every 8 (eight) hours as needed for nausea or vomiting. Start on the third day after cisplatin. 60 tablet 1   albuterol (VENTOLIN HFA) 108 (90 Base) MCG/ACT inhaler Inhale 2 puffs into the lungs every 4 (four) hours as needed for wheezing or shortness of breath (cough, shortness of breath or wheezing.). 1 each 1   ALPRAZolam (XANAX) 0.5 MG tablet Take 0.5 mg by mouth 4 (four) times daily as needed for anxiety.     aspirin EC 81 MG tablet  Take 1 tablet (81 mg total) by mouth daily. 90 tablet 3   atorvastatin (LIPITOR) 40 MG tablet Take 1 tablet (40 mg total) by mouth daily. 30 tablet 1   enalapril (VASOTEC) 20 MG tablet Take 1 tablet (20 mg total) by mouth 2 (two) times daily. 60 tablet 3   Multiple Vitamins-Minerals (PRESERVISION AREDS 2) CAPS Take 1 tablet by mouth 2 (two) times daily.     nitroGLYCERIN (NITROSTAT) 0.4 MG SL tablet Place 1 tablet (0.4 mg total) under the tongue every 5 (five) minutes as needed for chest pain. 25 tablet 1   prochlorperazine (COMPAZINE) 10 MG tablet Take 1 tablet (10 mg total) by mouth every 6 (six) hours as needed (Nausea or vomiting). (Patient not taking: Reported on 08/23/2023) 60 tablet 1   sucralfate (CARAFATE) 1 g tablet Take 1 tablet (1 g total) by mouth 4 (four) times daily. 120 tablet 0   torsemide (DEMADEX) 20 MG tablet Take 20 mg by mouth daily as needed.     No current facility-administered medications for this visit.    OBJECTIVE: Vitals:   09/13/23 0911  BP: 130/80  Pulse: 78  Resp: 16  Temp: 97.7 F (36.5 C)  SpO2: 96%     Body mass index is 25.33 kg/m.    ECOG FS:0 - Asymptomatic  General:  Well-developed, well-nourished, no acute distress. Eyes: Pink conjunctiva, anicteric sclera. HEENT: Normocephalic, moist mucous membranes.  No palpable lymphadenopathy. Lungs: No audible wheezing or coughing. Heart: Regular rate and rhythm. Abdomen: Soft, nontender, no obvious distention. Musculoskeletal: No edema, cyanosis, or clubbing. Neuro: Alert, answering all questions appropriately. Cranial nerves grossly intact. Skin: No rashes or petechiae noted. Psych: Normal affect.  LAB RESULTS:  Lab Results  Component Value Date   NA 137 09/13/2023   K 3.4 (L) 09/13/2023   CL 93 (L) 09/13/2023   CO2 30 09/13/2023   GLUCOSE 128 (H) 09/13/2023   BUN 18 09/13/2023   CREATININE 1.13 09/13/2023   CALCIUM 9.3 09/13/2023   PROT 6.2 (L) 01/14/2021   ALBUMIN 3.8 01/14/2021   AST 22 01/14/2021   ALT 22 01/14/2021   ALKPHOS 63 01/14/2021   BILITOT 1.2 01/14/2021   GFRNONAA >60 09/13/2023   GFRAA >60 06/09/2018    Lab Results  Component Value Date   WBC 6.5 09/13/2023   NEUTROABS 5.0 09/13/2023   HGB 13.8 09/13/2023   HCT 40.7 09/13/2023   MCV 91.3 09/13/2023   PLT 172 09/13/2023     STUDIES: No results found.  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  PET scan results from August 02, 2023 reviewed independently and reported as above confirming stage of disease.  Case was discussed at tumor board and although total laryngectomy should be considered given the significant risk of chronic aspiration, patient adamantly refuses surgical intervention.  Therefore, will proceed with concurrent chemotherapy and XRT using weekly cisplatin.  Proceed with cycle 4 of treatment tomorrow.  Patient is having difficulty with IV access and may require a port to finish his treatments.  Continue daily XRT.  Return to clinic in 1 week for further evaluation  and consideration of cycle 5.   Hypermetabolic prostate lesion: Although patient's PSA is only 3.73, this is highly suspicious for underlying malignancy.  Patient was seen by urology who plans to do a biopsy at the conclusion of his treatments  for his head and neck cancer.   Dysphagia: Patient does not complain of this today.  Continue Carafate as prescribed. Cough: Chronic and unchanged.  Recommended patient take OTC guaifenesin. Panic attacks: Patient has been instructed to call his primary care regarding his Xanax dosing.  Possibly keeping the same dose until he completes his treatments. Hypokalemia: Mild, monitor.  Patient expressed understanding and was in agreement with this plan. He also understands that He can call clinic at any time with any questions, concerns, or complaints.    Cancer Staging  Squamous cell carcinoma of right vocal cord [facility]) Staging form: Larynx - Glottis, AJCC 8th Edition - Clinical stage from 08/06/2023: Stage III (cT3, cN0, cM0) - Signed by Jeralyn Ruths, MD on 08/06/2023 Stage prefix: Initial diagnosis   Jeralyn Ruths, MD   09/13/2023 9:34 AM

## 2023-09-13 NOTE — Progress Notes (Signed)
Patient is starting to consider having a port put in depending on tomorrows treatment, he is really wanting Bobby Sandoval to start his IV for his chemo tomorrow, because she put it somewhere it wouldn't bother him as much.  He is wanting to see if he can get a higher dose of steroids to make him feel better after chemo.

## 2023-09-14 ENCOUNTER — Inpatient Hospital Stay: Payer: Medicare Other

## 2023-09-14 ENCOUNTER — Ambulatory Visit
Admission: RE | Admit: 2023-09-14 | Discharge: 2023-09-14 | Disposition: A | Discharge status: Home / Self Care | Payer: Medicare Other | Source: Ambulatory Visit | Attending: Radiation Oncology | Admitting: Radiation Oncology

## 2023-09-14 ENCOUNTER — Other Ambulatory Visit: Payer: Self-pay

## 2023-09-14 VITALS — BP 166/72 | HR 74 | Temp 98.6°F | Resp 18

## 2023-09-14 DIAGNOSIS — Z87891 Personal history of nicotine dependence: Secondary | ICD-10-CM | POA: Diagnosis not present

## 2023-09-14 DIAGNOSIS — I10 Essential (primary) hypertension: Secondary | ICD-10-CM | POA: Diagnosis not present

## 2023-09-14 DIAGNOSIS — R1310 Dysphagia, unspecified: Secondary | ICD-10-CM | POA: Diagnosis not present

## 2023-09-14 DIAGNOSIS — Z510 Encounter for antineoplastic radiation therapy: Secondary | ICD-10-CM | POA: Diagnosis not present

## 2023-09-14 DIAGNOSIS — C320 Malignant neoplasm of glottis: Secondary | ICD-10-CM

## 2023-09-14 DIAGNOSIS — E876 Hypokalemia: Secondary | ICD-10-CM | POA: Diagnosis not present

## 2023-09-14 DIAGNOSIS — D696 Thrombocytopenia, unspecified: Secondary | ICD-10-CM | POA: Diagnosis not present

## 2023-09-14 DIAGNOSIS — R059 Cough, unspecified: Secondary | ICD-10-CM | POA: Diagnosis not present

## 2023-09-14 DIAGNOSIS — Z5111 Encounter for antineoplastic chemotherapy: Secondary | ICD-10-CM | POA: Diagnosis not present

## 2023-09-14 DIAGNOSIS — Z79899 Other long term (current) drug therapy: Secondary | ICD-10-CM | POA: Diagnosis not present

## 2023-09-14 DIAGNOSIS — F410 Panic disorder [episodic paroxysmal anxiety] without agoraphobia: Secondary | ICD-10-CM | POA: Diagnosis not present

## 2023-09-14 LAB — RAD ONC ARIA SESSION SUMMARY
Course Elapsed Days: 21
Plan Fractions Treated to Date: 14
Plan Prescribed Dose Per Fraction: 2 Gy
Plan Total Fractions Prescribed: 35
Plan Total Prescribed Dose: 70 Gy
Reference Point Dosage Given to Date: 28 Gy
Reference Point Session Dosage Given: 2 Gy
Session Number: 14

## 2023-09-14 MED ORDER — MAGNESIUM SULFATE 2 GM/50ML IV SOLN
2.0000 g | Freq: Once | INTRAVENOUS | Status: AC
  Administered 2023-09-14: 2 g via INTRAVENOUS
  Filled 2023-09-14: qty 50

## 2023-09-14 MED ORDER — FOSAPREPITANT DIMEGLUMINE INJECTION 150 MG
150.0000 mg | Freq: Once | INTRAVENOUS | Status: AC
  Administered 2023-09-14: 150 mg via INTRAVENOUS
  Filled 2023-09-14: qty 150

## 2023-09-14 MED ORDER — SODIUM CHLORIDE 0.9 % IV SOLN
40.0000 mg/m2 | Freq: Once | INTRAVENOUS | Status: AC
  Administered 2023-09-14: 90 mg via INTRAVENOUS
  Filled 2023-09-14: qty 90

## 2023-09-14 MED ORDER — POTASSIUM CHLORIDE IN NACL 20-0.9 MEQ/L-% IV SOLN
Freq: Once | INTRAVENOUS | Status: AC
  Filled 2023-09-14: qty 1000

## 2023-09-14 MED ORDER — DEXAMETHASONE SODIUM PHOSPHATE 10 MG/ML IJ SOLN
10.0000 mg | Freq: Once | INTRAMUSCULAR | Status: AC
  Administered 2023-09-14: 10 mg via INTRAVENOUS
  Filled 2023-09-14: qty 1

## 2023-09-14 MED ORDER — PALONOSETRON HCL INJECTION 0.25 MG/5ML
0.2500 mg | Freq: Once | INTRAVENOUS | Status: AC
  Administered 2023-09-14: 0.25 mg via INTRAVENOUS
  Filled 2023-09-14: qty 5

## 2023-09-14 MED ORDER — SODIUM CHLORIDE 0.9 % IV SOLN
INTRAVENOUS | Status: DC
  Filled 2023-09-14: qty 250

## 2023-09-15 ENCOUNTER — Other Ambulatory Visit: Payer: Self-pay

## 2023-09-15 ENCOUNTER — Ambulatory Visit
Admission: RE | Admit: 2023-09-15 | Discharge: 2023-09-15 | Disposition: A | Discharge status: Home / Self Care | Payer: Medicare Other | Source: Ambulatory Visit | Attending: Radiation Oncology | Admitting: Radiation Oncology

## 2023-09-15 DIAGNOSIS — C320 Malignant neoplasm of glottis: Secondary | ICD-10-CM | POA: Diagnosis not present

## 2023-09-15 DIAGNOSIS — Z510 Encounter for antineoplastic radiation therapy: Secondary | ICD-10-CM | POA: Diagnosis not present

## 2023-09-15 LAB — RAD ONC ARIA SESSION SUMMARY
Course Elapsed Days: 22
Plan Fractions Treated to Date: 15
Plan Prescribed Dose Per Fraction: 2 Gy
Plan Total Fractions Prescribed: 35
Plan Total Prescribed Dose: 70 Gy
Reference Point Dosage Given to Date: 30 Gy
Reference Point Session Dosage Given: 2 Gy
Session Number: 15

## 2023-09-16 ENCOUNTER — Ambulatory Visit
Admission: RE | Admit: 2023-09-16 | Discharge: 2023-09-16 | Disposition: A | Discharge status: Home / Self Care | Payer: Medicare Other | Source: Ambulatory Visit | Attending: Radiation Oncology | Admitting: Radiation Oncology

## 2023-09-16 ENCOUNTER — Other Ambulatory Visit: Payer: Self-pay

## 2023-09-16 DIAGNOSIS — C320 Malignant neoplasm of glottis: Secondary | ICD-10-CM | POA: Diagnosis not present

## 2023-09-16 DIAGNOSIS — Z510 Encounter for antineoplastic radiation therapy: Secondary | ICD-10-CM | POA: Diagnosis not present

## 2023-09-16 LAB — RAD ONC ARIA SESSION SUMMARY
Course Elapsed Days: 23
Plan Fractions Treated to Date: 16
Plan Prescribed Dose Per Fraction: 2 Gy
Plan Total Fractions Prescribed: 35
Plan Total Prescribed Dose: 70 Gy
Reference Point Dosage Given to Date: 32 Gy
Reference Point Session Dosage Given: 2 Gy
Session Number: 16

## 2023-09-16 MED ORDER — ALPRAZOLAM 0.5 MG PO TABS: 0.5000 mg | ORAL_TABLET | Freq: Four times a day (QID) | ORAL | 0 refills | Status: DC | PRN

## 2023-09-17 ENCOUNTER — Other Ambulatory Visit: Payer: Self-pay

## 2023-09-17 ENCOUNTER — Ambulatory Visit
Admission: RE | Admit: 2023-09-17 | Discharge: 2023-09-17 | Disposition: A | Discharge status: Home / Self Care | Payer: Medicare Other | Source: Ambulatory Visit | Attending: Radiation Oncology | Admitting: Radiation Oncology

## 2023-09-17 DIAGNOSIS — C320 Malignant neoplasm of glottis: Secondary | ICD-10-CM | POA: Diagnosis not present

## 2023-09-17 DIAGNOSIS — Z510 Encounter for antineoplastic radiation therapy: Secondary | ICD-10-CM | POA: Diagnosis not present

## 2023-09-17 LAB — RAD ONC ARIA SESSION SUMMARY
Course Elapsed Days: 24
Plan Fractions Treated to Date: 17
Plan Prescribed Dose Per Fraction: 2 Gy
Plan Total Fractions Prescribed: 35
Plan Total Prescribed Dose: 70 Gy
Reference Point Dosage Given to Date: 34 Gy
Reference Point Session Dosage Given: 2 Gy
Session Number: 17

## 2023-09-20 ENCOUNTER — Ambulatory Visit
Admission: RE | Admit: 2023-09-20 | Discharge: 2023-09-20 | Disposition: A | Discharge status: Home / Self Care | Payer: Medicare Other | Source: Ambulatory Visit | Attending: Radiation Oncology | Admitting: Radiation Oncology

## 2023-09-20 ENCOUNTER — Encounter: Payer: Self-pay | Admitting: Oncology

## 2023-09-20 ENCOUNTER — Inpatient Hospital Stay: Payer: Medicare Other | Admitting: Oncology

## 2023-09-20 ENCOUNTER — Other Ambulatory Visit: Payer: Self-pay

## 2023-09-20 ENCOUNTER — Inpatient Hospital Stay: Payer: Medicare Other

## 2023-09-20 VITALS — BP 158/80 | HR 81 | Temp 97.2°F | Resp 16 | Wt 191.0 lb

## 2023-09-20 DIAGNOSIS — R059 Cough, unspecified: Secondary | ICD-10-CM | POA: Diagnosis not present

## 2023-09-20 DIAGNOSIS — Z87891 Personal history of nicotine dependence: Secondary | ICD-10-CM | POA: Diagnosis not present

## 2023-09-20 DIAGNOSIS — I10 Essential (primary) hypertension: Secondary | ICD-10-CM | POA: Diagnosis not present

## 2023-09-20 DIAGNOSIS — C320 Malignant neoplasm of glottis: Secondary | ICD-10-CM | POA: Diagnosis not present

## 2023-09-20 DIAGNOSIS — E876 Hypokalemia: Secondary | ICD-10-CM | POA: Diagnosis not present

## 2023-09-20 DIAGNOSIS — Z5111 Encounter for antineoplastic chemotherapy: Secondary | ICD-10-CM | POA: Diagnosis not present

## 2023-09-20 DIAGNOSIS — R1310 Dysphagia, unspecified: Secondary | ICD-10-CM | POA: Diagnosis not present

## 2023-09-20 DIAGNOSIS — Z510 Encounter for antineoplastic radiation therapy: Secondary | ICD-10-CM | POA: Diagnosis not present

## 2023-09-20 DIAGNOSIS — D696 Thrombocytopenia, unspecified: Secondary | ICD-10-CM | POA: Diagnosis not present

## 2023-09-20 DIAGNOSIS — Z79899 Other long term (current) drug therapy: Secondary | ICD-10-CM | POA: Diagnosis not present

## 2023-09-20 DIAGNOSIS — F410 Panic disorder [episodic paroxysmal anxiety] without agoraphobia: Secondary | ICD-10-CM | POA: Diagnosis not present

## 2023-09-20 LAB — CBC WITH DIFFERENTIAL (CANCER CENTER ONLY)
Abs Immature Granulocytes: 0.01 10*3/uL (ref 0.00–0.07)
Basophils Absolute: 0 10*3/uL (ref 0.0–0.1)
Basophils Relative: 1 %
Eosinophils Absolute: 0.1 10*3/uL (ref 0.0–0.5)
Eosinophils Relative: 1 %
HCT: 35.5 % — ABNORMAL LOW (ref 39.0–52.0)
Hemoglobin: 12.2 g/dL — ABNORMAL LOW (ref 13.0–17.0)
Immature Granulocytes: 0 %
Lymphocytes Relative: 31 %
Lymphs Abs: 1.2 10*3/uL (ref 0.7–4.0)
MCH: 30.9 pg (ref 26.0–34.0)
MCHC: 34.4 g/dL (ref 30.0–36.0)
MCV: 89.9 fL (ref 80.0–100.0)
Monocytes Absolute: 0.4 10*3/uL (ref 0.1–1.0)
Monocytes Relative: 10 %
Neutro Abs: 2.1 10*3/uL (ref 1.7–7.7)
Neutrophils Relative %: 57 %
Platelet Count: 91 10*3/uL — ABNORMAL LOW (ref 150–400)
RBC: 3.95 MIL/uL — ABNORMAL LOW (ref 4.22–5.81)
RDW: 12.5 % (ref 11.5–15.5)
WBC Count: 3.7 10*3/uL — ABNORMAL LOW (ref 4.0–10.5)
nRBC: 0 % (ref 0.0–0.2)

## 2023-09-20 LAB — RAD ONC ARIA SESSION SUMMARY
Course Elapsed Days: 27
Plan Fractions Treated to Date: 18
Plan Prescribed Dose Per Fraction: 2 Gy
Plan Total Fractions Prescribed: 35
Plan Total Prescribed Dose: 70 Gy
Reference Point Dosage Given to Date: 36 Gy
Reference Point Session Dosage Given: 2 Gy
Session Number: 18

## 2023-09-20 LAB — BASIC METABOLIC PANEL - CANCER CENTER ONLY
Anion gap: 11 (ref 5–15)
BUN: 18 mg/dL (ref 8–23)
CO2: 30 mmol/L (ref 22–32)
Calcium: 9.3 mg/dL (ref 8.9–10.3)
Chloride: 100 mmol/L (ref 98–111)
Creatinine: 0.98 mg/dL (ref 0.61–1.24)
GFR, Estimated: >60 mL/min (ref >60)
Glucose, Bld: 116 mg/dL — ABNORMAL HIGH (ref 70–99)
Potassium: 3.7 mmol/L (ref 3.5–5.1)
Sodium: 141 mmol/L (ref 135–145)

## 2023-09-20 LAB — MAGNESIUM: Magnesium: 1.9 mg/dL (ref 1.7–2.4)

## 2023-09-20 MED ORDER — STERILE WATER FOR INJECTION IJ SOLN
5.0000 mL | Freq: Four times a day (QID) | OROMUCOSAL | 3 refills | Status: DC | PRN
  Filled 2023-09-20: qty 480, 12d supply, fill #0

## 2023-09-20 MED FILL — Fosaprepitant Dimeglumine For IV Infusion 150 MG (Base Eq): INTRAVENOUS | Qty: 5 | Status: AC

## 2023-09-20 NOTE — Progress Notes (Signed)
Patient is still having pain when tries to eat or drink.

## 2023-09-20 NOTE — Progress Notes (Signed)
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  INTERVAL HISTORY: Patient returns to clinic today for repeat laboratory work, further evaluation, and consideration of cycle 5 of weekly cisplatin along with his daily XRT.  He has noticed increased dysphagia this past week, but otherwise feels well.  He continues to have chronic cough. He has no neurologic complaints.  He denies any recent fevers or illnesses.  He has a good appetite and denies weight loss.  He has no chest pain, shortness of breath, or hemoptysis.  He denies any nausea, vomiting, constipation, or diarrhea.  He has no urinary complaints.  Patient offers no further specific complaints today.  REVIEW OF SYSTEMS:   Review of Systems  Constitutional: Negative.  Negative for fever, malaise/fatigue and weight loss.  HENT:  Positive for sore throat.   Respiratory:  Positive for cough. Negative for hemoptysis and shortness of breath.   Cardiovascular: Negative.  Negative for chest pain and leg swelling.  Gastrointestinal: Negative.  Negative for abdominal pain.  Genitourinary: Negative.  Negative for dysuria.  Musculoskeletal: Negative.  Negative for back pain.  Skin: Negative.  Negative for rash.  Neurological: Negative.  Negative for dizziness, focal weakness, weakness and headaches.  Psychiatric/Behavioral: Negative.  The patient is not nervous/anxious.     As per HPI. Otherwise, a complete review of systems is negative.  PAST MEDICAL HISTORY: Past Medical History:  Diagnosis Date   AAA (abdominal aortic aneurysm) (HCC)    a. 2012 3.4 cm; b. 07/2017 Abd u/s: Dist abd Ao dil up to 4.8cm; c. 03/2018 CT Abd: infrarenal AAA  5.3 x 5.7cm; d. 06/2018 EVAR (28mm prox/19mm dist, 18cm length Gore Excluder Endoprosthesis).   Arthritis    CAD (coronary artery disease)    a. 2007 Inf MI w/ BMS to RCA x 2; b. 2008 PCI/DES to mid LAD @ [facility]; c. 07/2015 Cath: LAD 100 prox to prev placed stent w/ R->L collats, RCA stents patent. EF 50-55%. Med Rx; d. 04/2018 MV: EF 48%, mid-dist ant/apical/apical-lat ischemia->med rx.   Cataracts, bilateral    COPD (chronic obstructive pulmonary disease) (HCC)    Diastolic dysfunction    a. 02/2014 Echo: EF 55-65%. Gr1 DD, no rwma, mildly dil LA, nl RV fxn; b. 01/2018 Echo: EF 60-65%, Gr1 DD, mild MR, mildly dil LA. nl RV fxn. Nl PASP.   ETOH abuse    Grade I diastolic dysfunction    Hepatic steatosis    a. 03/2018 CT Abd: hepatic steatosis and possible early cirrhotic morphology of liver.   History of heart attack    History of kidney stones    Hyperlipidemia    Hypertension    MI (myocardial infarction) (HCC)    Mild mitral regurgitation by prior echocardiogram    Obesity    Panic disorder    Panic disorder    Status post AAA (abdominal aortic aneurysm) repair    Tobacco abuse     PAST SURGICAL HISTORY: Past Surgical History:  Procedure Laterality Date   25 GAUGE PARS PLANA VITRECTOMY WITH 20 GAUGE MVR PORT FOR MACULAR HOLE Right 01/14/2021   Procedure: 25 GAUGE PARS PLANA VITRECTOMY WITH 20 GAUGE MVR PORT FOR MACULAR HOLE;  Surgeon: Sherrie George, MD;  Location: [facility] OR;  Service: Ophthalmology;  Laterality: Right;   AIR/FLUID EXCHANGE Right 01/14/2021   Procedure: AIR/FLUID EXCHANGE;  Surgeon: Sherrie George, MD;  Location: [facility] OR;  Service: Ophthalmology;  Laterality: Right;   BACK SURGERY     CARDIAC CATHETERIZATION  08/2006   x1 stent [facility]. Mid RCA: 3.5 x 16 mm overlap with 3.5 x 24 mm Liberte bare-metal stents   CARDIAC CATHETERIZATION  10/2006   x1 stent [facility]: Mid LAD: 2.5 x 28 mm Cypher drug-eluting   CARDIAC CATHETERIZATION N/A 07/25/2015   Procedure: Left Heart Cath and  Coronary Angiography;  Surgeon: Iran Ouch, MD;  Location: [facility] INVASIVE CV LAB;  Service: Cardiovascular;  Laterality: N/A;   Cataract surgery Bilateral    COLONOSCOPY     coronary stents     ENDOVASCULAR REPAIR/STENT GRAFT N/A 06/08/2018   Procedure: ENDOVASCULAR REPAIR/STENT GRAFT;  Surgeon: Annice Needy, MD;  Location: [facility] INVASIVE CV LAB;  Service: Cardiovascular;  Laterality: N/A;   GAS INSERTION Right 01/14/2021   Procedure: INSERTION OF GAS;  Surgeon: Sherrie George, MD;  Location: [facility] OR;  Service: Ophthalmology;  Laterality: Right;   GAS/FLUID EXCHANGE Right 01/14/2021   Procedure: GAS/FLUID EXCHANGE;  Surgeon: Sherrie George, MD;  Location: [facility] OR;  Service: Ophthalmology;  Laterality: Right;   MEMBRANE PEEL Right 01/14/2021   Procedure: MEMBRANE PEEL;  Surgeon: Sherrie George, MD;  Location: [facility] OR;  Service: Ophthalmology;  Laterality: Right;   MICRODISCECTOMY LUMBAR     MICROLARYNGOSCOPY Bilateral 07/20/2023   Procedure: MICRODIRECT LARYNGOSCOPY WITH BIOPSY OF VOCAL CORD LESION;  Surgeon: Linus Salmons, MD;  Location: [facility] ORS;  Service: ENT;  Laterality: Bilateral;   PHOTOCOAGULATION WITH LASER Right 01/14/2021   Procedure: PHOTOCOAGULATION WITH LASER;  Surgeon: Sherrie George, MD;  Location: [facility] OR;  Service: Ophthalmology;  Laterality: Right;   SERUM PATCH Right 01/14/2021   Procedure: SERUM PATCH;  Surgeon: Sherrie George, MD;  Location: [facility] OR;  Service: Ophthalmology;  Laterality: Right;    FAMILY HISTORY: Family History  Problem Relation Age of Onset   Hypertension Mother    Heart disease Father     ADVANCED DIRECTIVES (Y/N):  N  HEALTH MAINTENANCE: Social History   Tobacco Use   Smoking status: Former    Current packs/day: 0.00    Average packs/day: 2.0 packs/day for 50.0 years (100.0 ttl pk-yrs)    Types: Cigarettes    Quit date: 07/20/2023    Years since quitting: 0.1   Smokeless tobacco: Never  Vaping Use   Vaping status: Never Used   Substance Use Topics   Alcohol use: Yes    Alcohol/week: 84.0 standard drinks of alcohol    Types: 84 Cans of beer per week    Comment: daily   Drug use: No     Colonoscopy:  PAP:  Bone density:  Lipid panel:  No Known Allergies  Current Outpatient Medications  Medication Sig Dispense Refill   albuterol (VENTOLIN HFA) 108 (90 Base) MCG/ACT inhaler Inhale 2 puffs into the lungs every 4 (four) hours as needed for wheezing or shortness of breath (cough, shortness of breath or wheezing.). 1 each 1   ALPRAZolam (XANAX) 0.5 MG tablet Take 1 tablet (0.5 mg total) by mouth 4 (four) times daily as needed for anxiety. 90 tablet 0   aspirin EC 81 MG tablet Take 1 tablet (81 mg total) by mouth daily. 90 tablet 3   atorvastatin (LIPITOR) 40 MG tablet Take 1 tablet (40 mg  total) by mouth daily. 30 tablet 1   enalapril (VASOTEC) 20 MG tablet Take 1 tablet (20 mg total) by mouth 2 (two) times daily. 60 tablet 3   Multiple Vitamins-Minerals (PRESERVISION AREDS 2) CAPS Take 1 tablet by mouth 2 (two) times daily.     nitroGLYCERIN (NITROSTAT) 0.4 MG SL tablet Place 1 tablet (0.4 mg total) under the tongue every 5 (five) minutes as needed for chest pain. 25 tablet 1   ondansetron (ZOFRAN) 8 MG tablet Take 1 tablet (8 mg total) by mouth every 8 (eight) hours as needed for nausea or vomiting. Start on the third day after cisplatin. 60 tablet 1   prochlorperazine (COMPAZINE) 10 MG tablet Take 1 tablet (10 mg total) by mouth every 6 (six) hours as needed (Nausea or vomiting). (Patient not taking: Reported on 08/23/2023) 60 tablet 1   sucralfate (CARAFATE) 1 g tablet Take 1 tablet (1 g total) by mouth 4 (four) times daily. 120 tablet 0   torsemide (DEMADEX) 20 MG tablet Take 20 mg by mouth daily as needed.     No current facility-administered medications for this visit.    OBJECTIVE: Vitals:   09/20/23 0917  BP: (!) 158/80  Pulse: 81  Resp: 16  Temp: (!) 97.2 F (36.2 C)  SpO2: 94%     Body mass  index is 25.2 kg/m.    ECOG FS:0 - Asymptomatic  General: Well-developed, well-nourished, no acute distress. Eyes: Pink conjunctiva, anicteric sclera. HEENT: Normocephalic, moist mucous membranes. Lungs: No audible wheezing or coughing. Heart: Regular rate and rhythm. Abdomen: Soft, nontender, no obvious distention. Musculoskeletal: No edema, cyanosis, or clubbing. Neuro: Alert, answering all questions appropriately. Cranial nerves grossly intact. Skin: No rashes or petechiae noted. Psych: Normal affect.  LAB RESULTS:  Lab Results  Component Value Date   NA 141 09/20/2023   K 3.7 09/20/2023   CL 100 09/20/2023   CO2 30 09/20/2023   GLUCOSE 116 (H) 09/20/2023   BUN 18 09/20/2023   CREATININE 0.98 09/20/2023   CALCIUM 9.3 09/20/2023   PROT 6.2 (L) 01/14/2021   ALBUMIN 3.8 01/14/2021   AST 22 01/14/2021   ALT 22 01/14/2021   ALKPHOS 63 01/14/2021   BILITOT 1.2 01/14/2021   GFRNONAA >60 09/20/2023   GFRAA >60 06/09/2018    Lab Results  Component Value Date   WBC 3.7 (L) 09/20/2023   NEUTROABS 2.1 09/20/2023   HGB 12.2 (L) 09/20/2023   HCT 35.5 (L) 09/20/2023   MCV 89.9 09/20/2023   PLT 91 (L) 09/20/2023     STUDIES: No results found.  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  PET scan results from August 02, 2023 reviewed independently and reported as above confirming stage of disease.  Case was discussed at tumor board and although total laryngectomy should be considered given the significant risk of chronic aspiration, patient adamantly refuses surgical intervention.  Therefore, will proceed with concurrent chemotherapy and XRT using weekly cisplatin.  Despite thrombocytopenia, will proceed with cycle 5 of treatment tomorrow. Patient is having difficulty with IV access and may require a port to finish his treatments.   Continue daily XRT.  Patient not received cisplatin over Christmas holiday next week and return to clinic in 2 weeks for further evaluation and consideration of cycle 6.   Hypermetabolic prostate lesion: Although patient's PSA is only 3.73, this is  highly suspicious for underlying malignancy.  Patient was seen by urology who plans to do a biopsy at the conclusion of his treatments for his head and neck cancer.   Dysphagia: Patient was given a prescription for Magic mouthwash today.   Cough: Chronic and unchanged.  Recommended patient take OTC guaifenesin. Panic attacks: Patient has been instructed to call his primary care regarding his Xanax dosing.  Possibly keeping the same dose until he completes his treatments. Hypokalemia: Resolved. Thrombocytopenia: Patient's platelet count is 91 today.  Proceed cautiously with treatment tomorrow then return to clinic in 2 weeks for continuation of treatment.  Hypertension: Patient's blood pressure moderately elevated today.  Monitor.  Patient expressed understanding and was in agreement with this plan. He also understands that He can call clinic at any time with any questions, concerns, or complaints.    Cancer Staging  Squamous cell carcinoma of right vocal cord [facility]) Staging form: Larynx - Glottis, AJCC 8th Edition - Clinical stage from 08/06/2023: Stage III (cT3, cN0, cM0) - Signed by Jeralyn Ruths, MD on 08/06/2023 Stage prefix: Initial diagnosis   Jeralyn Ruths, MD   09/20/2023 9:46 AM

## 2023-09-21 ENCOUNTER — Inpatient Hospital Stay: Payer: Medicare Other

## 2023-09-21 ENCOUNTER — Ambulatory Visit
Admission: RE | Admit: 2023-09-21 | Discharge: 2023-09-21 | Disposition: A | Discharge status: Home / Self Care | Payer: Medicare Other | Source: Ambulatory Visit | Attending: Radiation Oncology | Admitting: Radiation Oncology

## 2023-09-21 ENCOUNTER — Other Ambulatory Visit: Payer: Self-pay

## 2023-09-21 VITALS — BP 172/79 | HR 66 | Temp 98.5°F | Resp 18

## 2023-09-21 DIAGNOSIS — D696 Thrombocytopenia, unspecified: Secondary | ICD-10-CM | POA: Diagnosis not present

## 2023-09-21 DIAGNOSIS — R1310 Dysphagia, unspecified: Secondary | ICD-10-CM | POA: Diagnosis not present

## 2023-09-21 DIAGNOSIS — C320 Malignant neoplasm of glottis: Secondary | ICD-10-CM | POA: Diagnosis not present

## 2023-09-21 DIAGNOSIS — E876 Hypokalemia: Secondary | ICD-10-CM | POA: Diagnosis not present

## 2023-09-21 DIAGNOSIS — I10 Essential (primary) hypertension: Secondary | ICD-10-CM | POA: Diagnosis not present

## 2023-09-21 DIAGNOSIS — F410 Panic disorder [episodic paroxysmal anxiety] without agoraphobia: Secondary | ICD-10-CM | POA: Diagnosis not present

## 2023-09-21 DIAGNOSIS — Z510 Encounter for antineoplastic radiation therapy: Secondary | ICD-10-CM | POA: Diagnosis not present

## 2023-09-21 DIAGNOSIS — Z5111 Encounter for antineoplastic chemotherapy: Secondary | ICD-10-CM | POA: Diagnosis not present

## 2023-09-21 DIAGNOSIS — Z79899 Other long term (current) drug therapy: Secondary | ICD-10-CM | POA: Diagnosis not present

## 2023-09-21 DIAGNOSIS — Z87891 Personal history of nicotine dependence: Secondary | ICD-10-CM | POA: Diagnosis not present

## 2023-09-21 DIAGNOSIS — R059 Cough, unspecified: Secondary | ICD-10-CM | POA: Diagnosis not present

## 2023-09-21 LAB — RAD ONC ARIA SESSION SUMMARY
Course Elapsed Days: 28
Plan Fractions Treated to Date: 19
Plan Prescribed Dose Per Fraction: 2 Gy
Plan Total Fractions Prescribed: 35
Plan Total Prescribed Dose: 70 Gy
Reference Point Dosage Given to Date: 38 Gy
Reference Point Session Dosage Given: 2 Gy
Session Number: 19

## 2023-09-21 MED ORDER — MAGNESIUM SULFATE 2 GM/50ML IV SOLN
2.0000 g | Freq: Once | INTRAVENOUS | Status: AC
  Administered 2023-09-21: 2 g via INTRAVENOUS
  Filled 2023-09-21: qty 50

## 2023-09-21 MED ORDER — SODIUM CHLORIDE 0.9 % IV SOLN
150.0000 mg | Freq: Once | INTRAVENOUS | Status: AC
  Administered 2023-09-21: 150 mg via INTRAVENOUS
  Filled 2023-09-21: qty 150

## 2023-09-21 MED ORDER — POTASSIUM CHLORIDE IN NACL 20-0.9 MEQ/L-% IV SOLN
Freq: Once | INTRAVENOUS | Status: AC
  Filled 2023-09-21: qty 1000

## 2023-09-21 MED ORDER — SODIUM CHLORIDE 0.9 % IV SOLN
INTRAVENOUS | Status: DC
  Filled 2023-09-21: qty 250

## 2023-09-21 MED ORDER — CISPLATIN CHEMO INJECTION 100MG/100ML
40.0000 mg/m2 | Freq: Once | INTRAVENOUS | Status: AC
  Administered 2023-09-21: 90 mg via INTRAVENOUS
  Filled 2023-09-21: qty 90

## 2023-09-21 MED ORDER — PALONOSETRON HCL INJECTION 0.25 MG/5ML
0.2500 mg | Freq: Once | INTRAVENOUS | Status: AC
  Administered 2023-09-21: 0.25 mg via INTRAVENOUS
  Filled 2023-09-21: qty 5

## 2023-09-21 MED ORDER — DEXAMETHASONE SODIUM PHOSPHATE 10 MG/ML IJ SOLN
10.0000 mg | Freq: Once | INTRAMUSCULAR | Status: AC
Start: 2023-09-21 — End: 2023-09-21
  Administered 2023-09-21: 10 mg via INTRAVENOUS
  Filled 2023-09-21: qty 1

## 2023-09-21 NOTE — Patient Instructions (Signed)
 [facility] - A DEPT OF [facility]  Discharge Instructions: Thank you for choosing [facility] to provide your oncology and hematology care.  If you have a lab appointment with the Cancer Center, please go directly to the Cancer Center and check in at the registration area.  Wear comfortable clothing and clothing appropriate for easy access to any Portacath or PICC line.   We strive to give you quality time with your provider. You may need to reschedule your appointment if you arrive late (15 or more minutes).  Arriving late affects you and other patients whose appointments are after yours.  Also, if you miss three or more appointments without notifying the office, you may be dismissed from the clinic at the provider's discretion.      For prescription refill requests, have your pharmacy contact our office and allow 72 hours for refills to be completed.    Today you received the following chemotherapy and/or immunotherapy agents- cisplatin      To help prevent nausea and vomiting after your treatment, we encourage you to take your nausea medication as directed.  BELOW ARE SYMPTOMS THAT SHOULD BE REPORTED IMMEDIATELY: *FEVER GREATER THAN 100.4 F (38 C) OR HIGHER *CHILLS OR SWEATING *NAUSEA AND VOMITING THAT IS NOT CONTROLLED WITH YOUR NAUSEA MEDICATION *UNUSUAL SHORTNESS OF BREATH *UNUSUAL BRUISING OR BLEEDING *URINARY PROBLEMS (pain or burning when urinating, or frequent urination) *BOWEL PROBLEMS (unusual diarrhea, constipation, pain near the anus) TENDERNESS IN MOUTH AND THROAT WITH OR WITHOUT PRESENCE OF ULCERS (sore throat, sores in mouth, or a toothache) UNUSUAL RASH, SWELLING OR PAIN  UNUSUAL VAGINAL DISCHARGE OR ITCHING   Items with * indicate a potential emergency and should be followed up as soon as possible or go to the Emergency Department if any problems should occur.  Please show the CHEMOTHERAPY ALERT CARD or IMMUNOTHERAPY  ALERT CARD at check-in to the Emergency Department and triage nurse.  Should you have questions after your visit or need to cancel or reschedule your appointment, please contact [facility] - A DEPT OF [facility]  [phone] and follow the prompts.  Office hours are 8:00 a.m. to 4:30 p.m. Monday - Friday. Please note that voicemails left after 4:00 p.m. may not be returned until the following business day.  We are closed weekends and major holidays. You have access to a nurse at all times for urgent questions. Please call the main number to the clinic [phone] and follow the prompts.  For any non-urgent questions, you may also contact your provider using MyChart. We now offer e-Visits for anyone 9 and older to request care online for non-urgent symptoms. For details visit [email or web address].   Also download the MyChart app! Go to the app store, search "MyChart", open the app, select , and log in with your MyChart username and password.

## 2023-09-22 ENCOUNTER — Ambulatory Visit
Admission: RE | Admit: 2023-09-22 | Discharge: 2023-09-22 | Disposition: A | Discharge status: Home / Self Care | Payer: Medicare Other | Source: Ambulatory Visit | Attending: Radiation Oncology | Admitting: Radiation Oncology

## 2023-09-22 ENCOUNTER — Other Ambulatory Visit: Payer: Self-pay

## 2023-09-22 ENCOUNTER — Other Ambulatory Visit: Payer: Self-pay | Admitting: Oncology

## 2023-09-22 DIAGNOSIS — C320 Malignant neoplasm of glottis: Secondary | ICD-10-CM | POA: Diagnosis not present

## 2023-09-22 DIAGNOSIS — Z510 Encounter for antineoplastic radiation therapy: Secondary | ICD-10-CM | POA: Diagnosis not present

## 2023-09-22 LAB — RAD ONC ARIA SESSION SUMMARY
Course Elapsed Days: 29
Plan Fractions Treated to Date: 20
Plan Prescribed Dose Per Fraction: 2 Gy
Plan Total Fractions Prescribed: 35
Plan Total Prescribed Dose: 70 Gy
Reference Point Dosage Given to Date: 40 Gy
Reference Point Session Dosage Given: 2 Gy
Session Number: 20

## 2023-09-23 ENCOUNTER — Other Ambulatory Visit: Payer: Self-pay | Admitting: *Deleted

## 2023-09-23 ENCOUNTER — Ambulatory Visit: Admission: RE | Admit: 2023-09-23 | Payer: Medicare Other | Source: Ambulatory Visit

## 2023-09-23 ENCOUNTER — Ambulatory Visit: Payer: Medicare Other

## 2023-09-23 DIAGNOSIS — C329 Malignant neoplasm of larynx, unspecified: Secondary | ICD-10-CM

## 2023-09-24 ENCOUNTER — Ambulatory Visit: Payer: Medicare Other | Admitting: Oncology

## 2023-09-24 ENCOUNTER — Other Ambulatory Visit: Payer: Medicare Other

## 2023-09-24 ENCOUNTER — Ambulatory Visit: Payer: Medicare Other | Admitting: Internal Medicine

## 2023-09-24 ENCOUNTER — Ambulatory Visit: Payer: Medicare Other

## 2023-09-27 ENCOUNTER — Ambulatory Visit: Payer: Medicare Other

## 2023-09-27 ENCOUNTER — Ambulatory Visit: Admission: RE | Admit: 2023-09-27 | Payer: Medicare Other | Source: Ambulatory Visit

## 2023-09-27 ENCOUNTER — Ambulatory Visit: Payer: Medicare Other | Admitting: Internal Medicine

## 2023-09-27 ENCOUNTER — Inpatient Hospital Stay: Payer: Medicare Other

## 2023-09-27 ENCOUNTER — Other Ambulatory Visit: Payer: Medicare Other

## 2023-09-27 ENCOUNTER — Other Ambulatory Visit: Payer: Self-pay | Admitting: *Deleted

## 2023-09-27 DIAGNOSIS — C320 Malignant neoplasm of glottis: Secondary | ICD-10-CM | POA: Diagnosis not present

## 2023-09-27 DIAGNOSIS — D696 Thrombocytopenia, unspecified: Secondary | ICD-10-CM | POA: Diagnosis not present

## 2023-09-27 DIAGNOSIS — E876 Hypokalemia: Secondary | ICD-10-CM | POA: Diagnosis not present

## 2023-09-27 DIAGNOSIS — Z79899 Other long term (current) drug therapy: Secondary | ICD-10-CM | POA: Diagnosis not present

## 2023-09-27 DIAGNOSIS — I10 Essential (primary) hypertension: Secondary | ICD-10-CM | POA: Diagnosis not present

## 2023-09-27 DIAGNOSIS — F410 Panic disorder [episodic paroxysmal anxiety] without agoraphobia: Secondary | ICD-10-CM | POA: Diagnosis not present

## 2023-09-27 DIAGNOSIS — C329 Malignant neoplasm of larynx, unspecified: Secondary | ICD-10-CM

## 2023-09-27 DIAGNOSIS — Z5111 Encounter for antineoplastic chemotherapy: Secondary | ICD-10-CM | POA: Diagnosis not present

## 2023-09-27 DIAGNOSIS — R1310 Dysphagia, unspecified: Secondary | ICD-10-CM | POA: Diagnosis not present

## 2023-09-27 DIAGNOSIS — Z87891 Personal history of nicotine dependence: Secondary | ICD-10-CM | POA: Diagnosis not present

## 2023-09-27 DIAGNOSIS — R059 Cough, unspecified: Secondary | ICD-10-CM | POA: Diagnosis not present

## 2023-09-27 LAB — CBC (CANCER CENTER ONLY)
HCT: 32.2 % — ABNORMAL LOW (ref 39.0–52.0)
Hemoglobin: 11.1 g/dL — ABNORMAL LOW (ref 13.0–17.0)
MCH: 31 pg (ref 26.0–34.0)
MCHC: 34.5 g/dL (ref 30.0–36.0)
MCV: 89.9 fL (ref 80.0–100.0)
Platelet Count: 60 10*3/uL — ABNORMAL LOW (ref 150–400)
RBC: 3.58 MIL/uL — ABNORMAL LOW (ref 4.22–5.81)
RDW: 12.9 % (ref 11.5–15.5)
WBC Count: 2.7 10*3/uL — ABNORMAL LOW (ref 4.0–10.5)
nRBC: 0 % (ref 0.0–0.2)

## 2023-09-27 LAB — PSA: Prostatic Specific Antigen: 4.92 ng/mL — ABNORMAL HIGH (ref 0.00–4.00)

## 2023-09-28 ENCOUNTER — Ambulatory Visit: Payer: Medicare Other

## 2023-09-30 ENCOUNTER — Ambulatory Visit: Payer: Medicare Other

## 2023-10-01 ENCOUNTER — Ambulatory Visit: Payer: Medicare Other

## 2023-10-01 ENCOUNTER — Ambulatory Visit: Payer: Medicare Other | Admitting: Oncology

## 2023-10-01 ENCOUNTER — Other Ambulatory Visit: Payer: Medicare Other

## 2023-10-04 ENCOUNTER — Other Ambulatory Visit: Payer: Self-pay | Admitting: Oncology

## 2023-10-04 ENCOUNTER — Inpatient Hospital Stay: Payer: Medicare Other

## 2023-10-04 ENCOUNTER — Ambulatory Visit: Payer: Medicare Other | Admitting: Oncology

## 2023-10-04 ENCOUNTER — Encounter: Payer: Self-pay | Admitting: Oncology

## 2023-10-04 ENCOUNTER — Inpatient Hospital Stay: Payer: Medicare Other | Admitting: Oncology

## 2023-10-04 ENCOUNTER — Ambulatory Visit: Payer: Medicare Other

## 2023-10-04 ENCOUNTER — Ambulatory Visit: Admission: RE | Admit: 2023-10-04 | Payer: Medicare Other | Source: Ambulatory Visit

## 2023-10-04 ENCOUNTER — Ambulatory Visit: Payer: Medicare Other | Admitting: Internal Medicine

## 2023-10-04 ENCOUNTER — Other Ambulatory Visit: Payer: Self-pay | Admitting: *Deleted

## 2023-10-04 DIAGNOSIS — F410 Panic disorder [episodic paroxysmal anxiety] without agoraphobia: Secondary | ICD-10-CM | POA: Diagnosis not present

## 2023-10-04 DIAGNOSIS — Z87891 Personal history of nicotine dependence: Secondary | ICD-10-CM | POA: Diagnosis not present

## 2023-10-04 DIAGNOSIS — C320 Malignant neoplasm of glottis: Secondary | ICD-10-CM

## 2023-10-04 DIAGNOSIS — D696 Thrombocytopenia, unspecified: Secondary | ICD-10-CM | POA: Diagnosis not present

## 2023-10-04 DIAGNOSIS — I10 Essential (primary) hypertension: Secondary | ICD-10-CM | POA: Diagnosis not present

## 2023-10-04 DIAGNOSIS — E876 Hypokalemia: Secondary | ICD-10-CM | POA: Diagnosis not present

## 2023-10-04 DIAGNOSIS — Z5111 Encounter for antineoplastic chemotherapy: Secondary | ICD-10-CM | POA: Diagnosis not present

## 2023-10-04 DIAGNOSIS — Z79899 Other long term (current) drug therapy: Secondary | ICD-10-CM | POA: Diagnosis not present

## 2023-10-04 DIAGNOSIS — R1310 Dysphagia, unspecified: Secondary | ICD-10-CM | POA: Diagnosis not present

## 2023-10-04 DIAGNOSIS — C329 Malignant neoplasm of larynx, unspecified: Secondary | ICD-10-CM

## 2023-10-04 DIAGNOSIS — R059 Cough, unspecified: Secondary | ICD-10-CM | POA: Diagnosis not present

## 2023-10-04 LAB — CBC WITH DIFFERENTIAL (CANCER CENTER ONLY)
Abs Immature Granulocytes: 0.01 10*3/uL (ref 0.00–0.07)
Basophils Absolute: 0 10*3/uL (ref 0.0–0.1)
Basophils Relative: 1 %
Eosinophils Absolute: 0 10*3/uL (ref 0.0–0.5)
Eosinophils Relative: 1 %
HCT: 31.2 % — ABNORMAL LOW (ref 39.0–52.0)
Hemoglobin: 10.7 g/dL — ABNORMAL LOW (ref 13.0–17.0)
Immature Granulocytes: 1 %
Lymphocytes Relative: 48 %
Lymphs Abs: 1.1 10*3/uL (ref 0.7–4.0)
MCH: 31 pg (ref 26.0–34.0)
MCHC: 34.3 g/dL (ref 30.0–36.0)
MCV: 90.4 fL (ref 80.0–100.0)
Monocytes Absolute: 0.2 10*3/uL (ref 0.1–1.0)
Monocytes Relative: 10 %
Neutro Abs: 0.9 10*3/uL — ABNORMAL LOW (ref 1.7–7.7)
Neutrophils Relative %: 39 %
Platelet Count: 81 10*3/uL — ABNORMAL LOW (ref 150–400)
RBC: 3.45 MIL/uL — ABNORMAL LOW (ref 4.22–5.81)
RDW: 13.5 % (ref 11.5–15.5)
WBC Count: 2.2 10*3/uL — ABNORMAL LOW (ref 4.0–10.5)
nRBC: 0 % (ref 0.0–0.2)

## 2023-10-04 LAB — BASIC METABOLIC PANEL - CANCER CENTER ONLY
Anion gap: 9 (ref 5–15)
BUN: 20 mg/dL (ref 8–23)
CO2: 30 mmol/L (ref 22–32)
Calcium: 9.2 mg/dL (ref 8.9–10.3)
Chloride: 102 mmol/L (ref 98–111)
Creatinine: 1.24 mg/dL (ref 0.61–1.24)
GFR, Estimated: 59 mL/min — ABNORMAL LOW (ref >60)
Glucose, Bld: 118 mg/dL — ABNORMAL HIGH (ref 70–99)
Potassium: 3.5 mmol/L (ref 3.5–5.1)
Sodium: 141 mmol/L (ref 135–145)

## 2023-10-04 LAB — MAGNESIUM: Magnesium: 2 mg/dL (ref 1.7–2.4)

## 2023-10-04 NOTE — Progress Notes (Signed)
This encounter was created in error - please disregard.

## 2023-10-05 ENCOUNTER — Inpatient Hospital Stay: Payer: Medicare Other

## 2023-10-05 ENCOUNTER — Ambulatory Visit: Payer: Medicare Other

## 2023-10-07 ENCOUNTER — Ambulatory Visit
Admission: RE | Admit: 2023-10-07 | Discharge: 2023-10-07 | Disposition: A | Discharge status: Home / Self Care | Payer: Medicare Other | Source: Ambulatory Visit | Attending: Radiation Oncology | Admitting: Radiation Oncology

## 2023-10-07 ENCOUNTER — Inpatient Hospital Stay: Payer: Medicare Other | Attending: Oncology

## 2023-10-07 ENCOUNTER — Other Ambulatory Visit: Payer: Self-pay

## 2023-10-07 DIAGNOSIS — Z87891 Personal history of nicotine dependence: Secondary | ICD-10-CM | POA: Insufficient documentation

## 2023-10-07 DIAGNOSIS — C320 Malignant neoplasm of glottis: Secondary | ICD-10-CM | POA: Insufficient documentation

## 2023-10-07 DIAGNOSIS — N289 Disorder of kidney and ureter, unspecified: Secondary | ICD-10-CM | POA: Diagnosis not present

## 2023-10-07 DIAGNOSIS — F410 Panic disorder [episodic paroxysmal anxiety] without agoraphobia: Secondary | ICD-10-CM | POA: Insufficient documentation

## 2023-10-07 DIAGNOSIS — Z5111 Encounter for antineoplastic chemotherapy: Secondary | ICD-10-CM | POA: Insufficient documentation

## 2023-10-07 DIAGNOSIS — I10 Essential (primary) hypertension: Secondary | ICD-10-CM | POA: Insufficient documentation

## 2023-10-07 DIAGNOSIS — D649 Anemia, unspecified: Secondary | ICD-10-CM | POA: Insufficient documentation

## 2023-10-07 DIAGNOSIS — C329 Malignant neoplasm of larynx, unspecified: Secondary | ICD-10-CM

## 2023-10-07 DIAGNOSIS — Z510 Encounter for antineoplastic radiation therapy: Secondary | ICD-10-CM | POA: Insufficient documentation

## 2023-10-07 DIAGNOSIS — R059 Cough, unspecified: Secondary | ICD-10-CM | POA: Diagnosis not present

## 2023-10-07 LAB — RAD ONC ARIA SESSION SUMMARY
Course Elapsed Days: 44
Plan Fractions Treated to Date: 21
Plan Prescribed Dose Per Fraction: 2 Gy
Plan Total Fractions Prescribed: 35
Plan Total Prescribed Dose: 70 Gy
Reference Point Dosage Given to Date: 42 Gy
Reference Point Session Dosage Given: 2 Gy
Session Number: 21

## 2023-10-07 LAB — CBC (CANCER CENTER ONLY)
HCT: 32.1 % — ABNORMAL LOW (ref 39.0–52.0)
Hemoglobin: 11 g/dL — ABNORMAL LOW (ref 13.0–17.0)
MCH: 31.3 pg (ref 26.0–34.0)
MCHC: 34.3 g/dL (ref 30.0–36.0)
MCV: 91.5 fL (ref 80.0–100.0)
Platelet Count: 117 10*3/uL — ABNORMAL LOW (ref 150–400)
RBC: 3.51 MIL/uL — ABNORMAL LOW (ref 4.22–5.81)
RDW: 14.3 % (ref 11.5–15.5)
WBC Count: 2.6 10*3/uL — ABNORMAL LOW (ref 4.0–10.5)
nRBC: 0 % (ref 0.0–0.2)

## 2023-10-08 ENCOUNTER — Other Ambulatory Visit: Payer: Self-pay

## 2023-10-08 ENCOUNTER — Ambulatory Visit
Admission: RE | Admit: 2023-10-08 | Discharge: 2023-10-08 | Disposition: A | Discharge status: Home / Self Care | Payer: Medicare Other | Source: Ambulatory Visit | Attending: Radiation Oncology | Admitting: Radiation Oncology

## 2023-10-08 DIAGNOSIS — Z510 Encounter for antineoplastic radiation therapy: Secondary | ICD-10-CM | POA: Diagnosis not present

## 2023-10-08 DIAGNOSIS — C320 Malignant neoplasm of glottis: Secondary | ICD-10-CM | POA: Diagnosis not present

## 2023-10-08 LAB — RAD ONC ARIA SESSION SUMMARY
Course Elapsed Days: 45
Plan Fractions Treated to Date: 22
Plan Prescribed Dose Per Fraction: 2 Gy
Plan Total Fractions Prescribed: 35
Plan Total Prescribed Dose: 70 Gy
Reference Point Dosage Given to Date: 44 Gy
Reference Point Session Dosage Given: 2 Gy
Session Number: 22

## 2023-10-11 ENCOUNTER — Inpatient Hospital Stay (HOSPITAL_BASED_OUTPATIENT_CLINIC_OR_DEPARTMENT_OTHER): Payer: Medicare Other | Admitting: Oncology

## 2023-10-11 ENCOUNTER — Encounter: Payer: Self-pay | Admitting: Oncology

## 2023-10-11 ENCOUNTER — Inpatient Hospital Stay: Payer: Medicare Other

## 2023-10-11 ENCOUNTER — Other Ambulatory Visit: Payer: Self-pay

## 2023-10-11 ENCOUNTER — Ambulatory Visit
Admission: RE | Admit: 2023-10-11 | Discharge: 2023-10-11 | Disposition: A | Discharge status: Home / Self Care | Payer: Medicare Other | Source: Ambulatory Visit | Attending: Radiation Oncology | Admitting: Radiation Oncology

## 2023-10-11 VITALS — BP 146/90 | HR 82 | Temp 96.6°F | Resp 16 | Ht 73.0 in | Wt 186.8 lb

## 2023-10-11 DIAGNOSIS — I10 Essential (primary) hypertension: Secondary | ICD-10-CM | POA: Diagnosis not present

## 2023-10-11 DIAGNOSIS — Z510 Encounter for antineoplastic radiation therapy: Secondary | ICD-10-CM | POA: Diagnosis not present

## 2023-10-11 DIAGNOSIS — Z5111 Encounter for antineoplastic chemotherapy: Secondary | ICD-10-CM | POA: Diagnosis not present

## 2023-10-11 DIAGNOSIS — Z87891 Personal history of nicotine dependence: Secondary | ICD-10-CM | POA: Diagnosis not present

## 2023-10-11 DIAGNOSIS — R059 Cough, unspecified: Secondary | ICD-10-CM | POA: Diagnosis not present

## 2023-10-11 DIAGNOSIS — N289 Disorder of kidney and ureter, unspecified: Secondary | ICD-10-CM | POA: Diagnosis not present

## 2023-10-11 DIAGNOSIS — C320 Malignant neoplasm of glottis: Secondary | ICD-10-CM

## 2023-10-11 DIAGNOSIS — F410 Panic disorder [episodic paroxysmal anxiety] without agoraphobia: Secondary | ICD-10-CM | POA: Diagnosis not present

## 2023-10-11 DIAGNOSIS — D649 Anemia, unspecified: Secondary | ICD-10-CM | POA: Diagnosis not present

## 2023-10-11 LAB — CBC WITH DIFFERENTIAL (CANCER CENTER ONLY)
Abs Immature Granulocytes: 0.02 10*3/uL (ref 0.00–0.07)
Basophils Absolute: 0 10*3/uL (ref 0.0–0.1)
Basophils Relative: 1 %
Eosinophils Absolute: 0 10*3/uL (ref 0.0–0.5)
Eosinophils Relative: 1 %
HCT: 31.3 % — ABNORMAL LOW (ref 39.0–52.0)
Hemoglobin: 10.8 g/dL — ABNORMAL LOW (ref 13.0–17.0)
Immature Granulocytes: 1 %
Lymphocytes Relative: 45 %
Lymphs Abs: 1.2 10*3/uL (ref 0.7–4.0)
MCH: 31.7 pg (ref 26.0–34.0)
MCHC: 34.5 g/dL (ref 30.0–36.0)
MCV: 91.8 fL (ref 80.0–100.0)
Monocytes Absolute: 0.4 10*3/uL (ref 0.1–1.0)
Monocytes Relative: 13 %
Neutro Abs: 1 10*3/uL — ABNORMAL LOW (ref 1.7–7.7)
Neutrophils Relative %: 39 %
Platelet Count: 149 10*3/uL — ABNORMAL LOW (ref 150–400)
RBC: 3.41 MIL/uL — ABNORMAL LOW (ref 4.22–5.81)
RDW: 15.2 % (ref 11.5–15.5)
WBC Count: 2.7 10*3/uL — ABNORMAL LOW (ref 4.0–10.5)
nRBC: 0 % (ref 0.0–0.2)

## 2023-10-11 LAB — BASIC METABOLIC PANEL - CANCER CENTER ONLY
Anion gap: 10 (ref 5–15)
BUN: 16 mg/dL (ref 8–23)
CO2: 27 mmol/L (ref 22–32)
Calcium: 8.9 mg/dL (ref 8.9–10.3)
Chloride: 103 mmol/L (ref 98–111)
Creatinine: 1.21 mg/dL (ref 0.61–1.24)
GFR, Estimated: >60 mL/min (ref >60)
Glucose, Bld: 115 mg/dL — ABNORMAL HIGH (ref 70–99)
Potassium: 3.8 mmol/L (ref 3.5–5.1)
Sodium: 140 mmol/L (ref 135–145)

## 2023-10-11 LAB — RAD ONC ARIA SESSION SUMMARY
Course Elapsed Days: 48
Plan Fractions Treated to Date: 23
Plan Prescribed Dose Per Fraction: 2 Gy
Plan Total Fractions Prescribed: 35
Plan Total Prescribed Dose: 70 Gy
Reference Point Dosage Given to Date: 46 Gy
Reference Point Session Dosage Given: 2 Gy
Session Number: 23

## 2023-10-11 LAB — MAGNESIUM: Magnesium: 2 mg/dL (ref 1.7–2.4)

## 2023-10-11 MED FILL — Fosaprepitant Dimeglumine For IV Infusion 150 MG (Base Eq): INTRAVENOUS | Qty: 5 | Status: AC

## 2023-10-11 NOTE — Progress Notes (Signed)
 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  INTERVAL HISTORY: Patient returns to clinic today for further evaluation and reconsideration of cycle 6 of weekly cisplatin  along with daily XRT.  Treatment has been delayed secondary to persistent thrombocytopenia.  He currently feels well.  He does not complain of dysphagia today. He continues to have chronic cough. He has no neurologic complaints.  He denies any recent fevers or illnesses.  He has a good appetite and denies weight loss.  He has no chest pain, shortness of breath, or hemoptysis.  He denies any nausea, vomiting, constipation, or diarrhea.  He has no urinary complaints.  Patient offers no specific complaints today.  REVIEW OF SYSTEMS:   Review of Systems  Constitutional: Negative.  Negative for fever, malaise/fatigue and weight loss.  HENT:  Negative for sore throat.   Respiratory:  Positive for cough. Negative for hemoptysis and shortness of breath.   Cardiovascular: Negative.  Negative for chest pain and leg swelling.  Gastrointestinal: Negative.  Negative for abdominal pain.  Genitourinary: Negative.  Negative for dysuria.  Musculoskeletal: Negative.  Negative for back pain.  Skin: Negative.  Negative for rash.  Neurological: Negative.  Negative for dizziness, focal weakness, weakness and headaches.  Psychiatric/Behavioral: Negative.  The patient is not nervous/anxious.     As per HPI. Otherwise, a complete review of systems is negative.  PAST MEDICAL HISTORY: Past Medical History:  Diagnosis Date   AAA (abdominal aortic aneurysm) (HCC)    a. 2012 3.4 cm; b. 07/2017 Abd u/s: Dist abd Ao dil up to 4.8cm; c.  03/2018 CT Abd: infrarenal AAA 5.3 x 5.7cm; d. 06/2018 EVAR (28mm prox/51mm dist, 18cm length Gore Excluder Endoprosthesis).   Arthritis    CAD (coronary artery disease)    a. 2007 Inf MI w/ BMS to RCA x 2; b. 2008 PCI/DES to mid LAD @ [facility]; c. 07/2015 Cath: LAD 100 prox to prev placed stent w/ R->L collats, RCA stents patent. EF 50-55%. Med Rx; d. 04/2018 MV: EF 48%, mid-dist ant/apical/apical-lat ischemia->med rx.   Cataracts, bilateral    COPD (chronic obstructive pulmonary disease) (HCC)    Diastolic dysfunction    a. 02/2014 Echo: EF 55-65%. Gr1 DD, no rwma, mildly dil LA, nl RV fxn; b. 01/2018 Echo: EF 60-65%, Gr1 DD, mild MR, mildly dil LA. nl RV fxn. Nl PASP.   ETOH abuse    Grade I diastolic dysfunction    Hepatic steatosis    a. 03/2018 CT Abd: hepatic steatosis and possible early cirrhotic morphology of liver.   History of heart attack    History of kidney stones    Hyperlipidemia    Hypertension    MI (myocardial infarction) (HCC)    Mild mitral regurgitation by prior echocardiogram    Obesity    Panic disorder    Panic disorder    Status post AAA (abdominal aortic aneurysm) repair    Tobacco abuse     PAST SURGICAL HISTORY: Past Surgical History:  Procedure Laterality Date   25 GAUGE PARS PLANA VITRECTOMY WITH 20 GAUGE MVR PORT FOR MACULAR HOLE Right 01/14/2021   Procedure: 25 GAUGE PARS PLANA VITRECTOMY WITH 20 GAUGE MVR PORT FOR MACULAR HOLE;  Surgeon: Alvia,  Norleen BIRCH, MD;  Location: [facility] OR;  Service: Ophthalmology;  Laterality: Right;   AIR/FLUID EXCHANGE Right 01/14/2021   Procedure: AIR/FLUID EXCHANGE;  Surgeon: Alvia Norleen BIRCH, MD;  Location: [facility] OR;  Service: Ophthalmology;  Laterality: Right;   BACK SURGERY     CARDIAC CATHETERIZATION  08/2006   x1 stent MC. Mid RCA: 3.5 x 16 mm overlap with 3.5 x 24 mm Liberte bare-metal stents   CARDIAC CATHETERIZATION  10/2006   x1 stent [facility]: Mid LAD: 2.5 x 28 mm Cypher drug-eluting   CARDIAC CATHETERIZATION N/A 07/25/2015    Procedure: Left Heart Cath and Coronary Angiography;  Surgeon: Deatrice DELENA Cage, MD;  Location: [facility] INVASIVE CV LAB;  Service: Cardiovascular;  Laterality: N/A;   Cataract surgery Bilateral    COLONOSCOPY     coronary stents     ENDOVASCULAR REPAIR/STENT GRAFT N/A 06/08/2018   Procedure: ENDOVASCULAR REPAIR/STENT GRAFT;  Surgeon: Marea Selinda RAMAN, MD;  Location: [facility] INVASIVE CV LAB;  Service: Cardiovascular;  Laterality: N/A;   GAS INSERTION Right 01/14/2021   Procedure: INSERTION OF GAS;  Surgeon: Alvia Norleen BIRCH, MD;  Location: [facility] OR;  Service: Ophthalmology;  Laterality: Right;   GAS/FLUID EXCHANGE Right 01/14/2021   Procedure: GAS/FLUID EXCHANGE;  Surgeon: Alvia Norleen BIRCH, MD;  Location: [facility] OR;  Service: Ophthalmology;  Laterality: Right;   MEMBRANE PEEL Right 01/14/2021   Procedure: MEMBRANE PEEL;  Surgeon: Alvia Norleen BIRCH, MD;  Location: [facility] OR;  Service: Ophthalmology;  Laterality: Right;   MICRODISCECTOMY LUMBAR     MICROLARYNGOSCOPY Bilateral 07/20/2023   Procedure: MICRODIRECT LARYNGOSCOPY WITH BIOPSY OF VOCAL CORD LESION;  Surgeon: Herminio Miu, MD;  Location: [facility] ORS;  Service: ENT;  Laterality: Bilateral;   PHOTOCOAGULATION WITH LASER Right 01/14/2021   Procedure: PHOTOCOAGULATION WITH LASER;  Surgeon: Alvia Norleen BIRCH, MD;  Location: [facility] OR;  Service: Ophthalmology;  Laterality: Right;   SERUM PATCH Right 01/14/2021   Procedure: SERUM PATCH;  Surgeon: Alvia Norleen BIRCH, MD;  Location: [facility] OR;  Service: Ophthalmology;  Laterality: Right;    FAMILY HISTORY: Family History  Problem Relation Age of Onset   Hypertension Mother    Heart disease Father     ADVANCED DIRECTIVES (Y/N):  N  HEALTH MAINTENANCE: Social History   Tobacco Use   Smoking status: Former    Current packs/day: 0.00    Average packs/day: 2.0 packs/day for 50.0 years (100.0 ttl pk-yrs)    Types: Cigarettes    Quit date: 07/20/2023    Years since quitting: 0.2   Smokeless tobacco: Never  Vaping Use    Vaping status: Never Used  Substance Use Topics   Alcohol  use: Yes    Alcohol /week: 84.0 standard drinks of alcohol     Types: 84 Cans of beer per week    Comment: daily   Drug use: No     Colonoscopy:  PAP:  Bone density:  Lipid panel:  No Known Allergies  Current Outpatient Medications  Medication Sig Dispense Refill   ALPRAZolam  (XANAX ) 0.5 MG tablet Take 1 tablet (0.5 mg total) by mouth 4 (four) times daily as needed for anxiety. 90 tablet 0   aspirin  EC 81 MG tablet Take 1 tablet (81 mg total) by mouth daily. 90 tablet 3   atorvastatin  (LIPITOR) 40 MG tablet Take 1 tablet (40 mg total) by mouth daily. 30 tablet 1   enalapril  (VASOTEC ) 20 MG tablet Take 1 tablet (20 mg total) by mouth 2 (two) times daily. 60 tablet 3   Multiple Vitamins-Minerals (  PRESERVISION AREDS 2) CAPS Take 1 tablet by mouth 2 (two) times daily.     nitroGLYCERIN  (NITROSTAT ) 0.4 MG SL tablet Place 1 tablet (0.4 mg total) under the tongue every 5 (five) minutes as needed for chest pain. 25 tablet 1   ondansetron  (ZOFRAN ) 8 MG tablet Take 1 tablet (8 mg total) by mouth every 8 (eight) hours as needed for nausea or vomiting. Start on the third day after cisplatin . 60 tablet 1   sucralfate  (CARAFATE ) 1 g tablet TAKE ONE TABLET (1 GRAM TOTAL) BY MOUTH FOUR TIMES DAILY. 120 tablet 0   torsemide  (DEMADEX ) 20 MG tablet Take 20 mg by mouth daily as needed.     VENTOLIN  HFA 108 (90 Base) MCG/ACT inhaler INHALE TWO PUFFS INTO THE LUNGS EVERY FOUR HOURS AS NEEDED FOR WHEEZING OR SHORTNESS OF BREATH (COUGH, SHORTNESS OF BREATH OR WHEEZING.). 18 each 1   magic mouthwash (multi-ingredient) oral suspension Swish and swallow 5-10 mLs 4 (four) times daily as needed. (Patient not taking: Reported on 10/11/2023) 480 mL 3   prochlorperazine  (COMPAZINE ) 10 MG tablet Take 1 tablet (10 mg total) by mouth every 6 (six) hours as needed (Nausea or vomiting). (Patient not taking: Reported on 10/11/2023) 60 tablet 1   No current  facility-administered medications for this visit.    OBJECTIVE: Vitals:   10/11/23 0854  BP: (!) 146/90  Pulse: 82  Resp: 16  Temp: (!) 96.6 F (35.9 C)  SpO2: 98%     Body mass index is 24.65 kg/m.    ECOG FS:0 - Asymptomatic  General: Well-developed, well-nourished, no acute distress. Eyes: Pink conjunctiva, anicteric sclera. HEENT: Normocephalic, moist mucous membranes.  No palpable lymphadenopathy. Lungs: No audible wheezing or coughing. Heart: Regular rate and rhythm. Abdomen: Soft, nontender, no obvious distention. Musculoskeletal: No edema, cyanosis, or clubbing. Neuro: Alert, answering all questions appropriately. Cranial nerves grossly intact. Skin: No rashes or petechiae noted. Psych: Normal affect.  LAB RESULTS:  Lab Results  Component Value Date   NA 140 10/11/2023   K 3.8 10/11/2023   CL 103 10/11/2023   CO2 27 10/11/2023   GLUCOSE 115 (H) 10/11/2023   BUN 16 10/11/2023   CREATININE 1.21 10/11/2023   CALCIUM  8.9 10/11/2023   PROT 6.2 (L) 01/14/2021   ALBUMIN 3.8 01/14/2021   AST 22 01/14/2021   ALT 22 01/14/2021   ALKPHOS 63 01/14/2021   BILITOT 1.2 01/14/2021   GFRNONAA >60 10/11/2023   GFRAA >60 06/09/2018    Lab Results  Component Value Date   WBC 2.7 (L) 10/11/2023   NEUTROABS 1.0 (L) 10/11/2023   HGB 10.8 (L) 10/11/2023   HCT 31.3 (L) 10/11/2023   MCV 91.8 10/11/2023   PLT 149 (L) 10/11/2023     STUDIES: No results found.  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  PET scan results from August 02, 2023 reviewed independently and reported as above confirming stage of disease.  Case was discussed at tumor board and although total laryngectomy should be considered given the significant risk of chronic aspiration, patient adamantly refuses surgical intervention.  Therefore, will proceed with  concurrent chemotherapy and XRT using weekly cisplatin .  Proceed with cycle 6 of treatment tomorrow.  Continue daily XRT completing on October 27, 2023.  Return to clinic in 1 week for further evaluation and consideration of cycle 7.   Hypermetabolic prostate  lesion: Although patient's PSA is only 3.73, this is highly suspicious for underlying malignancy.  Patient was seen by urology who plans to do a biopsy at the conclusion of his treatments for his head and neck cancer.   Dysphagia: Patient does not complain of this today.  Continue Magic mouthwash as needed. Cough: Chronic and unchanged.  Previously recommended patient take OTC guaifenesin . Panic attacks: Patient has been instructed to call his primary care regarding his Xanax  dosing.  Possibly keeping the same dose until he completes his treatments. Hypokalemia: Resolved. Thrombocytopenia: Patient's platelet count has improved to 149.  Proceed with treatment as above.   Neutropenia: Patient's ANC is 1.0 today.  Proceed cautiously with treatment as above.   Anemia: Chronic and unchanged.  Patient's hemoglobin is 10.8 today.   Hypertension: Chronic and unchanged.  Patient's blood pressure moderately elevated today.  Monitor.  Patient expressed understanding and was in agreement with this plan. He also understands that He can call clinic at any time with any questions, concerns, or complaints.    Cancer Staging  Squamous cell carcinoma of right vocal cord [facility]) Staging form: Larynx - Glottis, AJCC 8th Edition - Clinical stage from 08/06/2023: Stage III (cT3, cN0, cM0) - Signed by Jacobo Evalene PARAS, MD on 08/06/2023 Stage prefix: Initial diagnosis   Evalene PARAS Jacobo, MD   10/11/2023 9:28 AM

## 2023-10-12 ENCOUNTER — Other Ambulatory Visit: Payer: Self-pay

## 2023-10-12 ENCOUNTER — Ambulatory Visit
Admission: RE | Admit: 2023-10-12 | Discharge: 2023-10-12 | Disposition: A | Discharge status: Home / Self Care | Payer: Medicare Other | Source: Ambulatory Visit | Attending: Radiation Oncology | Admitting: Radiation Oncology

## 2023-10-12 ENCOUNTER — Inpatient Hospital Stay: Payer: Medicare Other

## 2023-10-12 VITALS — BP 161/98 | HR 96 | Temp 97.2°F | Resp 18

## 2023-10-12 DIAGNOSIS — D649 Anemia, unspecified: Secondary | ICD-10-CM | POA: Diagnosis not present

## 2023-10-12 DIAGNOSIS — F410 Panic disorder [episodic paroxysmal anxiety] without agoraphobia: Secondary | ICD-10-CM | POA: Diagnosis not present

## 2023-10-12 DIAGNOSIS — R059 Cough, unspecified: Secondary | ICD-10-CM | POA: Diagnosis not present

## 2023-10-12 DIAGNOSIS — Z87891 Personal history of nicotine dependence: Secondary | ICD-10-CM | POA: Diagnosis not present

## 2023-10-12 DIAGNOSIS — N289 Disorder of kidney and ureter, unspecified: Secondary | ICD-10-CM | POA: Diagnosis not present

## 2023-10-12 DIAGNOSIS — I10 Essential (primary) hypertension: Secondary | ICD-10-CM | POA: Diagnosis not present

## 2023-10-12 DIAGNOSIS — Z510 Encounter for antineoplastic radiation therapy: Secondary | ICD-10-CM | POA: Diagnosis not present

## 2023-10-12 DIAGNOSIS — C320 Malignant neoplasm of glottis: Secondary | ICD-10-CM

## 2023-10-12 DIAGNOSIS — Z5111 Encounter for antineoplastic chemotherapy: Secondary | ICD-10-CM | POA: Diagnosis not present

## 2023-10-12 LAB — RAD ONC ARIA SESSION SUMMARY
Course Elapsed Days: 49
Plan Fractions Treated to Date: 24
Plan Prescribed Dose Per Fraction: 2 Gy
Plan Total Fractions Prescribed: 35
Plan Total Prescribed Dose: 70 Gy
Reference Point Dosage Given to Date: 48 Gy
Reference Point Session Dosage Given: 2 Gy
Session Number: 24

## 2023-10-12 MED ORDER — PALONOSETRON HCL INJECTION 0.25 MG/5ML
0.2500 mg | Freq: Once | INTRAVENOUS | Status: AC
  Administered 2023-10-12: 0.25 mg via INTRAVENOUS
  Filled 2023-10-12: qty 5

## 2023-10-12 MED ORDER — DEXAMETHASONE SODIUM PHOSPHATE 10 MG/ML IJ SOLN
10.0000 mg | Freq: Once | INTRAMUSCULAR | Status: AC
Start: 2023-10-12 — End: 2023-10-12
  Administered 2023-10-12: 10 mg via INTRAVENOUS
  Filled 2023-10-12: qty 1

## 2023-10-12 MED ORDER — SODIUM CHLORIDE 0.9 % IV SOLN
INTRAVENOUS | Status: DC
  Filled 2023-10-12: qty 250

## 2023-10-12 MED ORDER — SODIUM CHLORIDE 0.9 % IV SOLN
150.0000 mg | Freq: Once | INTRAVENOUS | Status: AC
  Administered 2023-10-12: 150 mg via INTRAVENOUS
  Filled 2023-10-12: qty 150

## 2023-10-12 MED ORDER — SODIUM CHLORIDE 0.9 % IV SOLN
40.0000 mg/m2 | Freq: Once | INTRAVENOUS | Status: AC
  Administered 2023-10-12: 90 mg via INTRAVENOUS
  Filled 2023-10-12: qty 90

## 2023-10-12 MED ORDER — MAGNESIUM SULFATE 2 GM/50ML IV SOLN
2.0000 g | Freq: Once | INTRAVENOUS | Status: AC
  Administered 2023-10-12: 2 g via INTRAVENOUS
  Filled 2023-10-12: qty 50

## 2023-10-12 MED ORDER — POTASSIUM CHLORIDE IN NACL 20-0.9 MEQ/L-% IV SOLN
Freq: Once | INTRAVENOUS | Status: AC
Start: 2023-10-12 — End: 2023-10-12
  Filled 2023-10-12: qty 1000

## 2023-10-12 NOTE — Patient Instructions (Signed)
 [facility] - A DEPT OF [facility]  Discharge Instructions: Thank you for choosing [facility] to provide your oncology and hematology care.  If you have a lab appointment with the Cancer Center, please go directly to the Cancer Center and check in at the registration area.  Wear comfortable clothing and clothing appropriate for easy access to any Portacath or PICC line.   We strive to give you quality time with your provider. You may need to reschedule your appointment if you arrive late (15 or more minutes).  Arriving late affects you and other patients whose appointments are after yours.  Also, if you miss three or more appointments without notifying the office, you may be dismissed from the clinic at the provider's discretion.      For prescription refill requests, have your pharmacy contact our office and allow 72 hours for refills to be completed.    Today you received the following chemotherapy and/or immunotherapy agents CISPLATIN      To help prevent nausea and vomiting after your treatment, we encourage you to take your nausea medication as directed.  BELOW ARE SYMPTOMS THAT SHOULD BE REPORTED IMMEDIATELY: *FEVER GREATER THAN 100.4 F (38 C) OR HIGHER *CHILLS OR SWEATING *NAUSEA AND VOMITING THAT IS NOT CONTROLLED WITH YOUR NAUSEA MEDICATION *UNUSUAL SHORTNESS OF BREATH *UNUSUAL BRUISING OR BLEEDING *URINARY PROBLEMS (pain or burning when urinating, or frequent urination) *BOWEL PROBLEMS (unusual diarrhea, constipation, pain near the anus) TENDERNESS IN MOUTH AND THROAT WITH OR WITHOUT PRESENCE OF ULCERS (sore throat, sores in mouth, or a toothache) UNUSUAL RASH, SWELLING OR PAIN  UNUSUAL VAGINAL DISCHARGE OR ITCHING   Items with * indicate a potential emergency and should be followed up as soon as possible or go to the Emergency Department if any problems should occur.  Please show the CHEMOTHERAPY ALERT CARD or IMMUNOTHERAPY  ALERT CARD at check-in to the Emergency Department and triage nurse.  Should you have questions after your visit or need to cancel or reschedule your appointment, please contact [facility] - A DEPT OF [facility]  [phone] and follow the prompts.  Office hours are 8:00 a.m. to 4:30 p.m. Monday - Friday. Please note that voicemails left after 4:00 p.m. may not be returned until the following business day.  We are closed weekends and major holidays. You have access to a nurse at all times for urgent questions. Please call the main number to the clinic [phone] and follow the prompts.  For any non-urgent questions, you may also contact your provider using MyChart. We now offer e-Visits for anyone 49 and older to request care online for non-urgent symptoms. For details visit [email or web address].   Also download the MyChart app! Go to the app store, search "MyChart", open the app, select [facility], and log in with your MyChart username and password.  Cisplatin Injection What is this medication? CISPLATIN (SIS pla tin) treats some types of cancer. It works by slowing down the growth of cancer cells. This medicine may be used for other purposes; ask your health care provider or pharmacist if you have questions. COMMON BRAND NAME(S): Platinol, Platinol -AQ What should I tell my care team before I take this medication? They need to know if you have any of these conditions: Eye disease, vision problems Hearing problems Kidney disease Low blood counts, such as low white cells, platelets, or red blood cells Tingling of the fingers  or toes, or other nerve disorder An unusual or allergic reaction to cisplatin, carboplatin, oxaliplatin, other medications, foods, dyes, or preservatives If you or your partner are pregnant or trying to get pregnant Breast-feeding How should I use this medication? This medication is injected into a vein. It is given by your care  team in a hospital or clinic setting. Talk to your care team about the use of this medication in children. Special care may be needed. Overdosage: If you think you have taken too much of this medicine contact a poison control center or emergency room at once. NOTE: This medicine is only for you. Do not share this medicine with others. What if I miss a dose? Keep appointments for follow-up doses. It is important not to miss your dose. Call your care team if you are unable to keep an appointment. What may interact with this medication? Do not take this medication with any of the following: Live virus vaccines This medication may also interact with the following: Certain antibiotics, such as amikacin, gentamicin, neomycin, polymyxin B, streptomycin, tobramycin, vancomycin Foscarnet This list may not describe all possible interactions. Give your health care provider a list of all the medicines, herbs, non-prescription drugs, or dietary supplements you use. Also tell them if you smoke, drink alcohol, or use illegal drugs. Some items may interact with your medicine. What should I watch for while using this medication? Your condition will be monitored carefully while you are receiving this medication. You may need blood work done while taking this medication. This medication may make you feel generally unwell. This is not uncommon, as chemotherapy can affect healthy cells as well as cancer cells. Report any side effects. Continue your course of treatment even though you feel ill unless your care team tells you to stop. This medication may increase your risk of getting an infection. Call your care team for advice if you get a fever, chills, sore throat, or other symptoms of a cold or flu. Do not treat yourself. Try to avoid being around people who are sick. Avoid taking medications that contain aspirin, acetaminophen, ibuprofen, naproxen, or ketoprofen unless instructed by your care team. These medications  may hide a fever. This medication may increase your risk to bruise or bleed. Call your care team if you notice any unusual bleeding. Be careful brushing or flossing your teeth or using a toothpick because you may get an infection or bleed more easily. If you have any dental work done, tell your dentist you are receiving this medication. Drink fluids as directed while you are taking this medication. This will help protect your kidneys. Call your care team if you get diarrhea. Do not treat yourself. Talk to your care team if you or your partner wish to become pregnant or think you might be pregnant. This medication can cause serious birth defects if taken during pregnancy and for 14 months after the last dose. A negative pregnancy test is required before starting this medication. A reliable form of contraception is recommended while taking this medication and for 14 months after the last dose. Talk to your care team about effective forms of contraception. Do not father a child while taking this medication and for 11 months after the last dose. Use a condom during sex during this time period. Do not breast-feed while taking this medication. This medication may cause infertility. Talk to your care team if you are concerned about your fertility. What side effects may I notice from receiving this medication? Side  effects that you should report to your care team as soon as possible: Allergic reactions--skin rash, itching, hives, swelling of the face, lips, tongue, or throat Eye pain, change in vision, vision loss Hearing loss, ringing in ears Infection--fever, chills, cough, sore throat, wounds that don't heal, pain or trouble when passing urine, general feeling of discomfort or being unwell Kidney injury--decrease in the amount of urine, swelling of the ankles, hands, or feet Low red blood cell level--unusual weakness or fatigue, dizziness, headache, trouble breathing Painful swelling, warmth, or redness  of the skin, blisters or sores at the infusion site Pain, tingling, or numbness in the hands or feet Unusual bruising or bleeding Side effects that usually do not require medical attention (report to your care team if they continue or are bothersome): Hair loss Nausea Vomiting This list may not describe all possible side effects. Call your doctor for medical advice about side effects. You may report side effects to FDA at 1-800-FDA-1088. Where should I keep my medication? This medication is given in a hospital or clinic. It will not be stored at home. NOTE: This sheet is a summary. It may not cover all possible information. If you have questions about this medicine, talk to your doctor, pharmacist, or health care provider.  2024 Elsevier/Gold Standard (2022-01-23 00:00:00)

## 2023-10-13 ENCOUNTER — Ambulatory Visit
Admission: RE | Admit: 2023-10-13 | Discharge: 2023-10-13 | Disposition: A | Discharge status: Home / Self Care | Payer: Medicare Other | Source: Ambulatory Visit | Attending: Radiation Oncology | Admitting: Radiation Oncology

## 2023-10-13 ENCOUNTER — Other Ambulatory Visit: Payer: Self-pay

## 2023-10-13 DIAGNOSIS — C320 Malignant neoplasm of glottis: Secondary | ICD-10-CM | POA: Diagnosis not present

## 2023-10-13 DIAGNOSIS — Z510 Encounter for antineoplastic radiation therapy: Secondary | ICD-10-CM | POA: Diagnosis not present

## 2023-10-13 LAB — RAD ONC ARIA SESSION SUMMARY
Course Elapsed Days: 50
Plan Fractions Treated to Date: 25
Plan Prescribed Dose Per Fraction: 2 Gy
Plan Total Fractions Prescribed: 35
Plan Total Prescribed Dose: 70 Gy
Reference Point Dosage Given to Date: 50 Gy
Reference Point Session Dosage Given: 2 Gy
Session Number: 25

## 2023-10-14 ENCOUNTER — Ambulatory Visit: Payer: Medicare Other

## 2023-10-14 ENCOUNTER — Other Ambulatory Visit: Payer: Self-pay

## 2023-10-14 ENCOUNTER — Ambulatory Visit
Admission: RE | Admit: 2023-10-14 | Discharge: 2023-10-14 | Disposition: A | Discharge status: Home / Self Care | Payer: Medicare Other | Source: Ambulatory Visit | Attending: Radiation Oncology | Admitting: Radiation Oncology

## 2023-10-14 DIAGNOSIS — C320 Malignant neoplasm of glottis: Secondary | ICD-10-CM | POA: Diagnosis not present

## 2023-10-14 DIAGNOSIS — Z510 Encounter for antineoplastic radiation therapy: Secondary | ICD-10-CM | POA: Diagnosis not present

## 2023-10-14 LAB — RAD ONC ARIA SESSION SUMMARY
Course Elapsed Days: 51
Plan Fractions Treated to Date: 26
Plan Prescribed Dose Per Fraction: 2 Gy
Plan Total Fractions Prescribed: 35
Plan Total Prescribed Dose: 70 Gy
Reference Point Dosage Given to Date: 52 Gy
Reference Point Session Dosage Given: 2 Gy
Session Number: 26

## 2023-10-15 ENCOUNTER — Ambulatory Visit: Payer: Medicare Other

## 2023-10-15 ENCOUNTER — Other Ambulatory Visit: Payer: Self-pay

## 2023-10-15 ENCOUNTER — Ambulatory Visit
Admission: RE | Admit: 2023-10-15 | Discharge: 2023-10-15 | Disposition: A | Discharge status: Home / Self Care | Payer: Medicare Other | Source: Ambulatory Visit | Attending: Radiation Oncology | Admitting: Radiation Oncology

## 2023-10-15 DIAGNOSIS — C320 Malignant neoplasm of glottis: Secondary | ICD-10-CM | POA: Diagnosis not present

## 2023-10-15 DIAGNOSIS — Z510 Encounter for antineoplastic radiation therapy: Secondary | ICD-10-CM | POA: Diagnosis not present

## 2023-10-15 LAB — RAD ONC ARIA SESSION SUMMARY
Course Elapsed Days: 52
Plan Fractions Treated to Date: 27
Plan Prescribed Dose Per Fraction: 2 Gy
Plan Total Fractions Prescribed: 35
Plan Total Prescribed Dose: 70 Gy
Reference Point Dosage Given to Date: 54 Gy
Reference Point Session Dosage Given: 2 Gy
Session Number: 27

## 2023-10-18 ENCOUNTER — Other Ambulatory Visit: Payer: Self-pay

## 2023-10-18 ENCOUNTER — Other Ambulatory Visit: Payer: Self-pay | Admitting: *Deleted

## 2023-10-18 ENCOUNTER — Ambulatory Visit
Admission: RE | Admit: 2023-10-18 | Discharge: 2023-10-18 | Disposition: A | Discharge status: Home / Self Care | Payer: Medicare Other | Source: Ambulatory Visit | Attending: Radiation Oncology | Admitting: Radiation Oncology

## 2023-10-18 DIAGNOSIS — Z510 Encounter for antineoplastic radiation therapy: Secondary | ICD-10-CM | POA: Diagnosis not present

## 2023-10-18 DIAGNOSIS — C320 Malignant neoplasm of glottis: Secondary | ICD-10-CM

## 2023-10-18 LAB — RAD ONC ARIA SESSION SUMMARY
Course Elapsed Days: 55
Plan Fractions Treated to Date: 28
Plan Prescribed Dose Per Fraction: 2 Gy
Plan Total Fractions Prescribed: 35
Plan Total Prescribed Dose: 70 Gy
Reference Point Dosage Given to Date: 56 Gy
Reference Point Session Dosage Given: 2 Gy
Session Number: 28

## 2023-10-19 ENCOUNTER — Encounter (INDEPENDENT_AMBULATORY_CARE_PROVIDER_SITE_OTHER): Payer: Non-veteran care | Admitting: Ophthalmology

## 2023-10-19 ENCOUNTER — Other Ambulatory Visit: Payer: Self-pay | Admitting: Oncology

## 2023-10-19 ENCOUNTER — Other Ambulatory Visit: Payer: Self-pay

## 2023-10-19 ENCOUNTER — Inpatient Hospital Stay: Payer: Medicare Other

## 2023-10-19 ENCOUNTER — Ambulatory Visit
Admission: RE | Admit: 2023-10-19 | Discharge: 2023-10-19 | Disposition: A | Discharge status: Home / Self Care | Payer: Medicare Other | Source: Ambulatory Visit | Attending: Radiation Oncology | Admitting: Radiation Oncology

## 2023-10-19 ENCOUNTER — Ambulatory Visit: Payer: Medicare Other

## 2023-10-19 ENCOUNTER — Inpatient Hospital Stay (HOSPITAL_BASED_OUTPATIENT_CLINIC_OR_DEPARTMENT_OTHER): Payer: Medicare Other | Admitting: Oncology

## 2023-10-19 VITALS — BP 153/78 | HR 86 | Temp 97.7°F | Resp 18 | Wt 185.5 lb

## 2023-10-19 DIAGNOSIS — C320 Malignant neoplasm of glottis: Secondary | ICD-10-CM

## 2023-10-19 DIAGNOSIS — Z5111 Encounter for antineoplastic chemotherapy: Secondary | ICD-10-CM | POA: Diagnosis not present

## 2023-10-19 DIAGNOSIS — F410 Panic disorder [episodic paroxysmal anxiety] without agoraphobia: Secondary | ICD-10-CM | POA: Diagnosis not present

## 2023-10-19 DIAGNOSIS — Z87891 Personal history of nicotine dependence: Secondary | ICD-10-CM | POA: Diagnosis not present

## 2023-10-19 DIAGNOSIS — N289 Disorder of kidney and ureter, unspecified: Secondary | ICD-10-CM | POA: Diagnosis not present

## 2023-10-19 DIAGNOSIS — D649 Anemia, unspecified: Secondary | ICD-10-CM | POA: Diagnosis not present

## 2023-10-19 DIAGNOSIS — R059 Cough, unspecified: Secondary | ICD-10-CM | POA: Diagnosis not present

## 2023-10-19 DIAGNOSIS — I10 Essential (primary) hypertension: Secondary | ICD-10-CM | POA: Diagnosis not present

## 2023-10-19 DIAGNOSIS — Z510 Encounter for antineoplastic radiation therapy: Secondary | ICD-10-CM | POA: Diagnosis not present

## 2023-10-19 LAB — BASIC METABOLIC PANEL - CANCER CENTER ONLY
Anion gap: 11 (ref 5–15)
BUN: 15 mg/dL (ref 8–23)
CO2: 27 mmol/L (ref 22–32)
Calcium: 8.8 mg/dL — ABNORMAL LOW (ref 8.9–10.3)
Chloride: 99 mmol/L (ref 98–111)
Creatinine: 1.48 mg/dL — ABNORMAL HIGH (ref 0.61–1.24)
GFR, Estimated: 48 mL/min — ABNORMAL LOW (ref >60)
Glucose, Bld: 120 mg/dL — ABNORMAL HIGH (ref 70–99)
Potassium: 4.2 mmol/L (ref 3.5–5.1)
Sodium: 137 mmol/L (ref 135–145)

## 2023-10-19 LAB — CBC WITH DIFFERENTIAL/PLATELET
Abs Immature Granulocytes: 0.11 10*3/uL — ABNORMAL HIGH (ref 0.00–0.07)
Basophils Absolute: 0 10*3/uL (ref 0.0–0.1)
Basophils Relative: 1 %
Eosinophils Absolute: 0 10*3/uL (ref 0.0–0.5)
Eosinophils Relative: 1 %
HCT: 31.3 % — ABNORMAL LOW (ref 39.0–52.0)
Hemoglobin: 10.9 g/dL — ABNORMAL LOW (ref 13.0–17.0)
Immature Granulocytes: 2 %
Lymphocytes Relative: 29 %
Lymphs Abs: 1.5 10*3/uL (ref 0.7–4.0)
MCH: 31.5 pg (ref 26.0–34.0)
MCHC: 34.8 g/dL (ref 30.0–36.0)
MCV: 90.5 fL (ref 80.0–100.0)
Monocytes Absolute: 0.6 10*3/uL (ref 0.1–1.0)
Monocytes Relative: 13 %
Neutro Abs: 2.8 10*3/uL (ref 1.7–7.7)
Neutrophils Relative %: 54 %
Platelets: 176 10*3/uL (ref 150–400)
RBC: 3.46 MIL/uL — ABNORMAL LOW (ref 4.22–5.81)
RDW: 15.7 % — ABNORMAL HIGH (ref 11.5–15.5)
Smear Review: NORMAL
WBC: 5 10*3/uL (ref 4.0–10.5)
nRBC: 0 % (ref 0.0–0.2)

## 2023-10-19 LAB — RAD ONC ARIA SESSION SUMMARY
Course Elapsed Days: 56
Plan Fractions Treated to Date: 29
Plan Prescribed Dose Per Fraction: 2 Gy
Plan Total Fractions Prescribed: 35
Plan Total Prescribed Dose: 70 Gy
Reference Point Dosage Given to Date: 58 Gy
Reference Point Session Dosage Given: 2 Gy
Session Number: 29

## 2023-10-19 LAB — MAGNESIUM: Magnesium: 1.4 mg/dL — ABNORMAL LOW (ref 1.7–2.4)

## 2023-10-19 MED FILL — Fosaprepitant Dimeglumine For IV Infusion 150 MG (Base Eq): INTRAVENOUS | Qty: 5 | Status: AC

## 2023-10-19 NOTE — Progress Notes (Signed)
 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  INTERVAL HISTORY: Patient returns to clinic today for further evaluation and consideration of his seventh and final treatment of weekly cisplatin .  He continues to tolerate his treatments well without significant side effects.  He continues to tolerate daily XRT.  He does not complain of dysphagia today. He continues to have chronic cough. He has no neurologic complaints.  He denies any recent fevers or illnesses.  He has a good appetite and denies weight loss.  He has no chest pain, shortness of breath, or hemoptysis.  He denies any nausea, vomiting, constipation, or diarrhea.  He has no urinary complaints.  Patient offers no further specific complaints today.  REVIEW OF SYSTEMS:   Review of Systems  Constitutional: Negative.  Negative for fever, malaise/fatigue and weight loss.  HENT:  Negative for sore throat.   Respiratory:  Positive for cough. Negative for hemoptysis and shortness of breath.   Cardiovascular: Negative.  Negative for chest pain and leg swelling.  Gastrointestinal: Negative.  Negative for abdominal pain.  Genitourinary: Negative.  Negative for dysuria.  Musculoskeletal: Negative.  Negative for back pain.  Skin: Negative.  Negative for rash.  Neurological: Negative.  Negative for dizziness, focal weakness, weakness and headaches.  Psychiatric/Behavioral: Negative.  The patient is not nervous/anxious.     As per HPI. Otherwise, a complete review of systems is negative.  PAST MEDICAL HISTORY: Past Medical History:  Diagnosis Date   AAA (abdominal aortic aneurysm) (HCC)    a. 2012 3.4 cm; b. 07/2017 Abd u/s: Dist  abd Ao dil up to 4.8cm; c. 03/2018 CT Abd: infrarenal AAA 5.3 x 5.7cm; d. 06/2018 EVAR (28mm prox/49mm dist, 18cm length Gore Excluder Endoprosthesis).   Arthritis    CAD (coronary artery disease)    a. 2007 Inf MI w/ BMS to RCA x 2; b. 2008 PCI/DES to mid LAD @ [facility]; c. 07/2015 Cath: LAD 100 prox to prev placed stent w/ R->L collats, RCA stents patent. EF 50-55%. Med Rx; d. 04/2018 MV: EF 48%, mid-dist ant/apical/apical-lat ischemia->med rx.   Cataracts, bilateral    COPD (chronic obstructive pulmonary disease) (HCC)    Diastolic dysfunction    a. 02/2014 Echo: EF 55-65%. Gr1 DD, no rwma, mildly dil LA, nl RV fxn; b. 01/2018 Echo: EF 60-65%, Gr1 DD, mild MR, mildly dil LA. nl RV fxn. Nl PASP.   ETOH abuse    Grade I diastolic dysfunction    Hepatic steatosis    a. 03/2018 CT Abd: hepatic steatosis and possible early cirrhotic morphology of liver.   History of heart attack    History of kidney stones    Hyperlipidemia    Hypertension    MI (myocardial infarction) (HCC)    Mild mitral regurgitation by prior echocardiogram    Obesity    Panic disorder    Panic disorder    Status post AAA (abdominal aortic aneurysm) repair    Tobacco abuse     PAST SURGICAL HISTORY: Past Surgical History:  Procedure Laterality Date   25 GAUGE PARS PLANA VITRECTOMY WITH 20 GAUGE MVR PORT FOR MACULAR HOLE Right 01/14/2021   Procedure: 25 GAUGE PARS PLANA VITRECTOMY WITH 20 GAUGE MVR PORT FOR  MACULAR HOLE;  Surgeon: Alvia Norleen BIRCH, MD;  Location: [facility] OR;  Service: Ophthalmology;  Laterality: Right;   AIR/FLUID EXCHANGE Right 01/14/2021   Procedure: AIR/FLUID EXCHANGE;  Surgeon: Alvia Norleen BIRCH, MD;  Location: [facility] OR;  Service: Ophthalmology;  Laterality: Right;   BACK SURGERY     CARDIAC CATHETERIZATION  08/2006   x1 stent [facility]. Mid RCA: 3.5 x 16 mm overlap with 3.5 x 24 mm Liberte bare-metal stents   CARDIAC CATHETERIZATION  10/2006   x1 stent [facility]: Mid LAD: 2.5 x 28 mm Cypher drug-eluting   CARDIAC  CATHETERIZATION N/A 07/25/2015   Procedure: Left Heart Cath and Coronary Angiography;  Surgeon: Deatrice DELENA Cage, MD;  Location: [facility] INVASIVE CV LAB;  Service: Cardiovascular;  Laterality: N/A;   Cataract surgery Bilateral    COLONOSCOPY     coronary stents     ENDOVASCULAR REPAIR/STENT GRAFT N/A 06/08/2018   Procedure: ENDOVASCULAR REPAIR/STENT GRAFT;  Surgeon: Marea Selinda RAMAN, MD;  Location: [facility] INVASIVE CV LAB;  Service: Cardiovascular;  Laterality: N/A;   GAS INSERTION Right 01/14/2021   Procedure: INSERTION OF GAS;  Surgeon: Alvia Norleen BIRCH, MD;  Location: [facility] OR;  Service: Ophthalmology;  Laterality: Right;   GAS/FLUID EXCHANGE Right 01/14/2021   Procedure: GAS/FLUID EXCHANGE;  Surgeon: Alvia Norleen BIRCH, MD;  Location: [facility] OR;  Service: Ophthalmology;  Laterality: Right;   MEMBRANE PEEL Right 01/14/2021   Procedure: MEMBRANE PEEL;  Surgeon: Alvia Norleen BIRCH, MD;  Location: [facility] OR;  Service: Ophthalmology;  Laterality: Right;   MICRODISCECTOMY LUMBAR     MICROLARYNGOSCOPY Bilateral 07/20/2023   Procedure: MICRODIRECT LARYNGOSCOPY WITH BIOPSY OF VOCAL CORD LESION;  Surgeon: Herminio Miu, MD;  Location: [facility] ORS;  Service: ENT;  Laterality: Bilateral;   PHOTOCOAGULATION WITH LASER Right 01/14/2021   Procedure: PHOTOCOAGULATION WITH LASER;  Surgeon: Alvia Norleen BIRCH, MD;  Location: [facility] OR;  Service: Ophthalmology;  Laterality: Right;   SERUM PATCH Right 01/14/2021   Procedure: SERUM PATCH;  Surgeon: Alvia Norleen BIRCH, MD;  Location: [facility] OR;  Service: Ophthalmology;  Laterality: Right;    FAMILY HISTORY: Family History  Problem Relation Age of Onset   Hypertension Mother    Heart disease Father     ADVANCED DIRECTIVES (Y/N):  N  HEALTH MAINTENANCE: Social History   Tobacco Use   Smoking status: Former    Current packs/day: 0.00    Average packs/day: 2.0 packs/day for 50.0 years (100.0 ttl pk-yrs)    Types: Cigarettes    Quit date: 07/20/2023    Years since quitting: 0.2    Smokeless tobacco: Never  Vaping Use   Vaping status: Never Used  Substance Use Topics   Alcohol  use: Yes    Alcohol /week: 84.0 standard drinks of alcohol     Types: 84 Cans of beer per week    Comment: daily   Drug use: No     Colonoscopy:  PAP:  Bone density:  Lipid panel:  No Known Allergies  Current Outpatient Medications  Medication Sig Dispense Refill   ALPRAZolam  (XANAX ) 0.5 MG tablet Take 1 tablet (0.5 mg total) by mouth 4 (four) times daily as needed for anxiety. 90 tablet 0   aspirin  EC 81 MG tablet Take 1 tablet (81 mg total) by mouth daily. 90 tablet 3   atorvastatin  (LIPITOR) 40 MG tablet Take 1 tablet (40 mg total) by mouth daily. 30 tablet 1   enalapril  (VASOTEC ) 20 MG tablet Take 1 tablet (20 mg total) by mouth 2 (two) times daily. 60 tablet  3   magic mouthwash (multi-ingredient) oral suspension Swish and swallow 5-10 mLs 4 (four) times daily as needed. (Patient not taking: Reported on 10/11/2023) 480 mL 3   Multiple Vitamins-Minerals (PRESERVISION AREDS 2) CAPS Take 1 tablet by mouth 2 (two) times daily.     nitroGLYCERIN  (NITROSTAT ) 0.4 MG SL tablet Place 1 tablet (0.4 mg total) under the tongue every 5 (five) minutes as needed for chest pain. 25 tablet 1   ondansetron  (ZOFRAN ) 8 MG tablet Take 1 tablet (8 mg total) by mouth every 8 (eight) hours as needed for nausea or vomiting. Start on the third day after cisplatin . 60 tablet 1   prochlorperazine  (COMPAZINE ) 10 MG tablet Take 1 tablet (10 mg total) by mouth every 6 (six) hours as needed (Nausea or vomiting). (Patient not taking: Reported on 10/11/2023) 60 tablet 1   sucralfate  (CARAFATE ) 1 g tablet TAKE ONE TABLET (1 GRAM TOTAL) BY MOUTH FOUR TIMES DAILY. 120 tablet 0   torsemide  (DEMADEX ) 20 MG tablet Take 20 mg by mouth daily as needed.     VENTOLIN  HFA 108 (90 Base) MCG/ACT inhaler INHALE TWO PUFFS INTO THE LUNGS EVERY FOUR HOURS AS NEEDED FOR WHEEZING OR SHORTNESS OF BREATH (COUGH, SHORTNESS OF BREATH OR WHEEZING.).  18 each 1   No current facility-administered medications for this visit.    OBJECTIVE: Vitals:   10/19/23 0941 10/19/23 0944  BP: (!) 158/91 (!) 153/78  Pulse: 86   Resp: 18   Temp: 97.7 F (36.5 C)   SpO2: 97%      Body mass index is 24.47 kg/m.    ECOG FS:0 - Asymptomatic  General: Well-developed, well-nourished, no acute distress. Eyes: Pink conjunctiva, anicteric sclera. HEENT: Normocephalic, moist mucous membranes.  No palpable lymphadenopathy. Lungs: No audible wheezing or coughing. Heart: Regular rate and rhythm. Abdomen: Soft, nontender, no obvious distention. Musculoskeletal: No edema, cyanosis, or clubbing. Neuro: Alert, answering all questions appropriately. Cranial nerves grossly intact. Skin: No rashes or petechiae noted. Psych: Normal affect.  LAB RESULTS:  Lab Results  Component Value Date   NA 137 10/19/2023   K 4.2 10/19/2023   CL 99 10/19/2023   CO2 27 10/19/2023   GLUCOSE 120 (H) 10/19/2023   BUN 15 10/19/2023   CREATININE 1.48 (H) 10/19/2023   CALCIUM  8.8 (L) 10/19/2023   PROT 6.2 (L) 01/14/2021   ALBUMIN 3.8 01/14/2021   AST 22 01/14/2021   ALT 22 01/14/2021   ALKPHOS 63 01/14/2021   BILITOT 1.2 01/14/2021   GFRNONAA 48 (L) 10/19/2023   GFRAA >60 06/09/2018    Lab Results  Component Value Date   WBC 5.0 10/19/2023   NEUTROABS 2.8 10/19/2023   HGB 10.9 (L) 10/19/2023   HCT 31.3 (L) 10/19/2023   MCV 90.5 10/19/2023   PLT 176 10/19/2023     STUDIES: No results found.  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  PET scan results from August 02, 2023 reviewed independently and reported as above confirming stage of disease.  Case was discussed at tumor board and although total laryngectomy should be considered given the significant risk of chronic aspiration, patient adamantly refuses surgical  intervention.  Therefore, will proceed with concurrent chemotherapy and XRT using weekly cisplatin .  Proceed with cycle 7 of treatment tomorrow.  Will dose reduce cisplatin  25% given the patient's mildly increased creatinine.  Continue daily XRT  completing on October 27, 2023.  Return to clinic in 1 month with repeat laboratory work and further evaluation.  Will repeat PET scan in 3 months. Hypermetabolic prostate lesion: Although patient's PSA is only 3.73, this is highly suspicious for underlying malignancy.  Patient was seen by urology who plans to do a biopsy at the conclusion of his treatments for his head and neck cancer.   Dysphagia: Patient does not complain of this today.  Continue Magic mouthwash as needed. Cough: Chronic and unchanged.  Previously recommended patient take OTC guaifenesin . Panic attacks: Patient has been instructed to call his primary care regarding his Xanax  dosing.  Possibly keeping the same dose until he completes his treatments. Hypokalemia: Resolved. Thrombocytopenia: Resolved.  Proceed with treatment as above.   Neutropenia: Resolved.   Anemia: Chronic and unchanged.  Patient's hemoglobin is 10.9 today. Renal insufficiency: Dose reduced cisplatin  as above.  Patient will also receive IV fluids with treatment. Hypertension: Chronic and unchanged.  Patient's blood pressure moderately elevated today.  Monitor.  Patient expressed understanding and was in agreement with this plan. He also understands that He can call clinic at any time with any questions, concerns, or complaints.    Cancer Staging  Squamous cell carcinoma of right vocal cord [facility]) Staging form: Larynx - Glottis, AJCC 8th Edition - Clinical stage from 08/06/2023: Stage III (cT3, cN0, cM0) - Signed by Jacobo Evalene PARAS, MD on 08/06/2023 Stage prefix: Initial diagnosis   Evalene PARAS Jacobo, MD   10/19/2023 11:21 AM

## 2023-10-20 ENCOUNTER — Other Ambulatory Visit: Payer: Self-pay

## 2023-10-20 ENCOUNTER — Inpatient Hospital Stay: Payer: Medicare Other

## 2023-10-20 ENCOUNTER — Ambulatory Visit
Admission: RE | Admit: 2023-10-20 | Discharge: 2023-10-20 | Disposition: A | Discharge status: Home / Self Care | Payer: Medicare Other | Source: Ambulatory Visit | Attending: Radiation Oncology | Admitting: Radiation Oncology

## 2023-10-20 VITALS — BP 139/64 | HR 86 | Temp 96.7°F | Resp 18

## 2023-10-20 DIAGNOSIS — Z5111 Encounter for antineoplastic chemotherapy: Secondary | ICD-10-CM | POA: Diagnosis not present

## 2023-10-20 DIAGNOSIS — Z510 Encounter for antineoplastic radiation therapy: Secondary | ICD-10-CM | POA: Diagnosis not present

## 2023-10-20 DIAGNOSIS — Z87891 Personal history of nicotine dependence: Secondary | ICD-10-CM | POA: Diagnosis not present

## 2023-10-20 DIAGNOSIS — I10 Essential (primary) hypertension: Secondary | ICD-10-CM | POA: Diagnosis not present

## 2023-10-20 DIAGNOSIS — C320 Malignant neoplasm of glottis: Secondary | ICD-10-CM

## 2023-10-20 DIAGNOSIS — D649 Anemia, unspecified: Secondary | ICD-10-CM | POA: Diagnosis not present

## 2023-10-20 DIAGNOSIS — R059 Cough, unspecified: Secondary | ICD-10-CM | POA: Diagnosis not present

## 2023-10-20 DIAGNOSIS — F410 Panic disorder [episodic paroxysmal anxiety] without agoraphobia: Secondary | ICD-10-CM | POA: Diagnosis not present

## 2023-10-20 DIAGNOSIS — N289 Disorder of kidney and ureter, unspecified: Secondary | ICD-10-CM | POA: Diagnosis not present

## 2023-10-20 LAB — RAD ONC ARIA SESSION SUMMARY
Course Elapsed Days: 57
Plan Fractions Treated to Date: 30
Plan Prescribed Dose Per Fraction: 2 Gy
Plan Total Fractions Prescribed: 35
Plan Total Prescribed Dose: 70 Gy
Reference Point Dosage Given to Date: 60 Gy
Reference Point Session Dosage Given: 2 Gy
Session Number: 30

## 2023-10-20 MED ORDER — MAGNESIUM SULFATE 2 GM/50ML IV SOLN
2.0000 g | Freq: Once | INTRAVENOUS | Status: AC
  Administered 2023-10-20: 2 g via INTRAVENOUS
  Filled 2023-10-20: qty 50

## 2023-10-20 MED ORDER — SODIUM CHLORIDE 0.9 % IV SOLN
30.0000 mg/m2 | Freq: Once | INTRAVENOUS | Status: AC
  Administered 2023-10-20: 67 mg via INTRAVENOUS
  Filled 2023-10-20: qty 67

## 2023-10-20 MED ORDER — PALONOSETRON HCL INJECTION 0.25 MG/5ML
0.2500 mg | Freq: Once | INTRAVENOUS | Status: AC
  Administered 2023-10-20: 0.25 mg via INTRAVENOUS
  Filled 2023-10-20: qty 5

## 2023-10-20 MED ORDER — DEXAMETHASONE SODIUM PHOSPHATE 10 MG/ML IJ SOLN
10.0000 mg | Freq: Once | INTRAMUSCULAR | Status: AC
  Administered 2023-10-20: 10 mg via INTRAVENOUS
  Filled 2023-10-20: qty 1

## 2023-10-20 MED ORDER — POTASSIUM CHLORIDE IN NACL 20-0.9 MEQ/L-% IV SOLN
Freq: Once | INTRAVENOUS | Status: AC
  Filled 2023-10-20: qty 1000

## 2023-10-20 MED ORDER — SODIUM CHLORIDE 0.9 % IV SOLN
INTRAVENOUS | Status: DC
  Filled 2023-10-20: qty 250

## 2023-10-20 MED ORDER — SODIUM CHLORIDE 0.9 % IV SOLN
150.0000 mg | Freq: Once | INTRAVENOUS | Status: AC
  Administered 2023-10-20: 150 mg via INTRAVENOUS
  Filled 2023-10-20: qty 150

## 2023-10-20 NOTE — Patient Instructions (Signed)
 [facility] - A DEPT OF [facility]  Discharge Instructions: Thank you for choosing [facility] to provide your oncology and hematology care.  If you have a lab appointment with the Cancer Center, please go directly to the Cancer Center and check in at the registration area.  Wear comfortable clothing and clothing appropriate for easy access to any Portacath or PICC line.   We strive to give you quality time with your provider. You may need to reschedule your appointment if you arrive late (15 or more minutes).  Arriving late affects you and other patients whose appointments are after yours.  Also, if you miss three or more appointments without notifying the office, you may be dismissed from the clinic at the provider's discretion.      For prescription refill requests, have your pharmacy contact our office and allow 72 hours for refills to be completed.    Today you received the following chemotherapy and/or immunotherapy agents CISPLATIN      To help prevent nausea and vomiting after your treatment, we encourage you to take your nausea medication as directed.  BELOW ARE SYMPTOMS THAT SHOULD BE REPORTED IMMEDIATELY: *FEVER GREATER THAN 100.4 F (38 C) OR HIGHER *CHILLS OR SWEATING *NAUSEA AND VOMITING THAT IS NOT CONTROLLED WITH YOUR NAUSEA MEDICATION *UNUSUAL SHORTNESS OF BREATH *UNUSUAL BRUISING OR BLEEDING *URINARY PROBLEMS (pain or burning when urinating, or frequent urination) *BOWEL PROBLEMS (unusual diarrhea, constipation, pain near the anus) TENDERNESS IN MOUTH AND THROAT WITH OR WITHOUT PRESENCE OF ULCERS (sore throat, sores in mouth, or a toothache) UNUSUAL RASH, SWELLING OR PAIN  UNUSUAL VAGINAL DISCHARGE OR ITCHING   Items with * indicate a potential emergency and should be followed up as soon as possible or go to the Emergency Department if any problems should occur.  Please show the CHEMOTHERAPY ALERT CARD or IMMUNOTHERAPY  ALERT CARD at check-in to the Emergency Department and triage nurse.  Should you have questions after your visit or need to cancel or reschedule your appointment, please contact [facility] - A DEPT OF [facility]  [phone] and follow the prompts.  Office hours are 8:00 a.m. to 4:30 p.m. Monday - Friday. Please note that voicemails left after 4:00 p.m. may not be returned until the following business day.  We are closed weekends and major holidays. You have access to a nurse at all times for urgent questions. Please call the main number to the clinic [phone] and follow the prompts.  For any non-urgent questions, you may also contact your provider using MyChart. We now offer e-Visits for anyone 49 and older to request care online for non-urgent symptoms. For details visit [email or web address].   Also download the MyChart app! Go to the app store, search "MyChart", open the app, select [facility], and log in with your MyChart username and password.  Cisplatin Injection What is this medication? CISPLATIN (SIS pla tin) treats some types of cancer. It works by slowing down the growth of cancer cells. This medicine may be used for other purposes; ask your health care provider or pharmacist if you have questions. COMMON BRAND NAME(S): Platinol, Platinol -AQ What should I tell my care team before I take this medication? They need to know if you have any of these conditions: Eye disease, vision problems Hearing problems Kidney disease Low blood counts, such as low white cells, platelets, or red blood cells Tingling of the fingers  or toes, or other nerve disorder An unusual or allergic reaction to cisplatin, carboplatin, oxaliplatin, other medications, foods, dyes, or preservatives If you or your partner are pregnant or trying to get pregnant Breast-feeding How should I use this medication? This medication is injected into a vein. It is given by your care  team in a hospital or clinic setting. Talk to your care team about the use of this medication in children. Special care may be needed. Overdosage: If you think you have taken too much of this medicine contact a poison control center or emergency room at once. NOTE: This medicine is only for you. Do not share this medicine with others. What if I miss a dose? Keep appointments for follow-up doses. It is important not to miss your dose. Call your care team if you are unable to keep an appointment. What may interact with this medication? Do not take this medication with any of the following: Live virus vaccines This medication may also interact with the following: Certain antibiotics, such as amikacin, gentamicin, neomycin, polymyxin B, streptomycin, tobramycin, vancomycin Foscarnet This list may not describe all possible interactions. Give your health care provider a list of all the medicines, herbs, non-prescription drugs, or dietary supplements you use. Also tell them if you smoke, drink alcohol, or use illegal drugs. Some items may interact with your medicine. What should I watch for while using this medication? Your condition will be monitored carefully while you are receiving this medication. You may need blood work done while taking this medication. This medication may make you feel generally unwell. This is not uncommon, as chemotherapy can affect healthy cells as well as cancer cells. Report any side effects. Continue your course of treatment even though you feel ill unless your care team tells you to stop. This medication may increase your risk of getting an infection. Call your care team for advice if you get a fever, chills, sore throat, or other symptoms of a cold or flu. Do not treat yourself. Try to avoid being around people who are sick. Avoid taking medications that contain aspirin, acetaminophen, ibuprofen, naproxen, or ketoprofen unless instructed by your care team. These medications  may hide a fever. This medication may increase your risk to bruise or bleed. Call your care team if you notice any unusual bleeding. Be careful brushing or flossing your teeth or using a toothpick because you may get an infection or bleed more easily. If you have any dental work done, tell your dentist you are receiving this medication. Drink fluids as directed while you are taking this medication. This will help protect your kidneys. Call your care team if you get diarrhea. Do not treat yourself. Talk to your care team if you or your partner wish to become pregnant or think you might be pregnant. This medication can cause serious birth defects if taken during pregnancy and for 14 months after the last dose. A negative pregnancy test is required before starting this medication. A reliable form of contraception is recommended while taking this medication and for 14 months after the last dose. Talk to your care team about effective forms of contraception. Do not father a child while taking this medication and for 11 months after the last dose. Use a condom during sex during this time period. Do not breast-feed while taking this medication. This medication may cause infertility. Talk to your care team if you are concerned about your fertility. What side effects may I notice from receiving this medication? Side  effects that you should report to your care team as soon as possible: Allergic reactions--skin rash, itching, hives, swelling of the face, lips, tongue, or throat Eye pain, change in vision, vision loss Hearing loss, ringing in ears Infection--fever, chills, cough, sore throat, wounds that don't heal, pain or trouble when passing urine, general feeling of discomfort or being unwell Kidney injury--decrease in the amount of urine, swelling of the ankles, hands, or feet Low red blood cell level--unusual weakness or fatigue, dizziness, headache, trouble breathing Painful swelling, warmth, or redness  of the skin, blisters or sores at the infusion site Pain, tingling, or numbness in the hands or feet Unusual bruising or bleeding Side effects that usually do not require medical attention (report to your care team if they continue or are bothersome): Hair loss Nausea Vomiting This list may not describe all possible side effects. Call your doctor for medical advice about side effects. You may report side effects to FDA at 1-800-FDA-1088. Where should I keep my medication? This medication is given in a hospital or clinic. It will not be stored at home. NOTE: This sheet is a summary. It may not cover all possible information. If you have questions about this medicine, talk to your doctor, pharmacist, or health care provider.  2024 Elsevier/Gold Standard (2022-01-23 00:00:00)

## 2023-10-20 NOTE — Progress Notes (Signed)
 Nutrition Assessment:  Head and neck cancer  78 year old male with stage III SCC of vocal cord.  Past medical history of etoh use, HDL, HTN, MI, AAA, CAD, COPD, heart failure.  Patient receiving last dose of cisplatin  today and last radiation planned for 1/22. Declined surgery.  Met with patient during infusion.  Reports decreased appetite.  Feels like he has a golf ball in his throat.  Reports thick saliva and taste alterations.  Yesterday ate a cheese dog and 1/2.  Drank pepsi, juice, little water  and coffee.     Medications: MMW, ondansetron , compazine , carafate   Labs: glucose 120, creatinine 1.48, Mag 1.4, Hgb 1.9  Anthropometrics:   Height: 73 inches Weight: 185 lb 8 oz 211 lb on 10/21 BMI: 24  12% weight loss in the last 3 months, significant  Estimated Energy Needs  Kcals: 2500-2900 Protein: 84-126 g Fluid: 2500-2925ml  NUTRITION DIAGNOSIS: Inadequate oral intake related to cancer and related treatment side effects as evidenced by 12% weight loss in the last 3 months and poor appetite due to taste alterations, thick saliva and soreness in back of throat.  INTERVENTION: Discussed strategies to help with thick saliva.  Handout provided Provided list of soft foods high in protein Encouraged oral nutrition supplement shakes for added calories Recommend referral to SLP due to side effects from radiation Contact information provided    MONITORING, EVALUATION, GOAL: weight trends, intake   NEXT VISIT: Wed, Feb 12 phone  Abia Monaco B. Leighton Punches, RD, LDN Registered Dietitian [phone]

## 2023-10-21 ENCOUNTER — Ambulatory Visit: Payer: Medicare Other

## 2023-10-21 ENCOUNTER — Ambulatory Visit
Admission: RE | Admit: 2023-10-21 | Discharge: 2023-10-21 | Disposition: A | Discharge status: Home / Self Care | Payer: Medicare Other | Source: Ambulatory Visit | Attending: Radiation Oncology | Admitting: Radiation Oncology

## 2023-10-21 ENCOUNTER — Other Ambulatory Visit: Payer: Self-pay

## 2023-10-21 DIAGNOSIS — Z510 Encounter for antineoplastic radiation therapy: Secondary | ICD-10-CM | POA: Diagnosis not present

## 2023-10-21 DIAGNOSIS — C320 Malignant neoplasm of glottis: Secondary | ICD-10-CM | POA: Diagnosis not present

## 2023-10-21 LAB — RAD ONC ARIA SESSION SUMMARY
Course Elapsed Days: 58
Plan Fractions Treated to Date: 31
Plan Prescribed Dose Per Fraction: 2 Gy
Plan Total Fractions Prescribed: 35
Plan Total Prescribed Dose: 70 Gy
Reference Point Dosage Given to Date: 62 Gy
Reference Point Session Dosage Given: 2 Gy
Session Number: 31

## 2023-10-22 ENCOUNTER — Other Ambulatory Visit: Payer: Self-pay

## 2023-10-22 ENCOUNTER — Ambulatory Visit
Admission: RE | Admit: 2023-10-22 | Discharge: 2023-10-22 | Disposition: A | Discharge status: Home / Self Care | Payer: Medicare Other | Source: Ambulatory Visit | Attending: Radiation Oncology | Admitting: Radiation Oncology

## 2023-10-22 DIAGNOSIS — Z510 Encounter for antineoplastic radiation therapy: Secondary | ICD-10-CM | POA: Diagnosis not present

## 2023-10-22 DIAGNOSIS — C320 Malignant neoplasm of glottis: Secondary | ICD-10-CM | POA: Diagnosis not present

## 2023-10-22 LAB — RAD ONC ARIA SESSION SUMMARY
Course Elapsed Days: 59
Plan Fractions Treated to Date: 32
Plan Prescribed Dose Per Fraction: 2 Gy
Plan Total Fractions Prescribed: 35
Plan Total Prescribed Dose: 70 Gy
Reference Point Dosage Given to Date: 64 Gy
Reference Point Session Dosage Given: 2 Gy
Session Number: 32

## 2023-10-25 ENCOUNTER — Ambulatory Visit
Admission: RE | Admit: 2023-10-25 | Discharge: 2023-10-25 | Disposition: A | Discharge status: Home / Self Care | Payer: Medicare Other | Source: Ambulatory Visit | Attending: Radiation Oncology | Admitting: Radiation Oncology

## 2023-10-25 ENCOUNTER — Ambulatory Visit: Payer: Medicare Other

## 2023-10-25 ENCOUNTER — Other Ambulatory Visit: Payer: Self-pay

## 2023-10-25 DIAGNOSIS — Z510 Encounter for antineoplastic radiation therapy: Secondary | ICD-10-CM | POA: Diagnosis not present

## 2023-10-25 DIAGNOSIS — C320 Malignant neoplasm of glottis: Secondary | ICD-10-CM | POA: Diagnosis not present

## 2023-10-25 LAB — RAD ONC ARIA SESSION SUMMARY
Course Elapsed Days: 62
Plan Fractions Treated to Date: 33
Plan Prescribed Dose Per Fraction: 2 Gy
Plan Total Fractions Prescribed: 35
Plan Total Prescribed Dose: 70 Gy
Reference Point Dosage Given to Date: 66 Gy
Reference Point Session Dosage Given: 2 Gy
Session Number: 33

## 2023-10-26 ENCOUNTER — Other Ambulatory Visit: Payer: Self-pay

## 2023-10-26 ENCOUNTER — Ambulatory Visit
Admission: RE | Admit: 2023-10-26 | Discharge: 2023-10-26 | Disposition: A | Discharge status: Home / Self Care | Payer: Medicare Other | Source: Ambulatory Visit | Attending: Radiation Oncology | Admitting: Radiation Oncology

## 2023-10-26 DIAGNOSIS — Z510 Encounter for antineoplastic radiation therapy: Secondary | ICD-10-CM | POA: Diagnosis not present

## 2023-10-26 DIAGNOSIS — C320 Malignant neoplasm of glottis: Secondary | ICD-10-CM | POA: Diagnosis not present

## 2023-10-26 LAB — RAD ONC ARIA SESSION SUMMARY
Course Elapsed Days: 63
Plan Fractions Treated to Date: 34
Plan Prescribed Dose Per Fraction: 2 Gy
Plan Total Fractions Prescribed: 35
Plan Total Prescribed Dose: 70 Gy
Reference Point Dosage Given to Date: 68 Gy
Reference Point Session Dosage Given: 2 Gy
Session Number: 34

## 2023-10-27 ENCOUNTER — Ambulatory Visit
Admission: RE | Admit: 2023-10-27 | Discharge: 2023-10-27 | Disposition: A | Discharge status: Home / Self Care | Payer: Medicare Other | Source: Ambulatory Visit | Attending: Radiation Oncology | Admitting: Radiation Oncology

## 2023-10-27 ENCOUNTER — Other Ambulatory Visit: Payer: Self-pay

## 2023-10-27 ENCOUNTER — Other Ambulatory Visit: Payer: Medicare Other | Admitting: Urology

## 2023-10-27 DIAGNOSIS — Z510 Encounter for antineoplastic radiation therapy: Secondary | ICD-10-CM | POA: Diagnosis not present

## 2023-10-27 DIAGNOSIS — C320 Malignant neoplasm of glottis: Secondary | ICD-10-CM | POA: Diagnosis not present

## 2023-10-27 LAB — RAD ONC ARIA SESSION SUMMARY
Course Elapsed Days: 64
Plan Fractions Treated to Date: 35
Plan Prescribed Dose Per Fraction: 2 Gy
Plan Total Fractions Prescribed: 35
Plan Total Prescribed Dose: 70 Gy
Reference Point Dosage Given to Date: 70 Gy
Reference Point Session Dosage Given: 2 Gy
Session Number: 35

## 2023-10-28 ENCOUNTER — Ambulatory Visit: Payer: Medicare Other

## 2023-10-28 NOTE — Radiation Completion Notes (Signed)
Patient Name: Bobby Sandoval, Bobby Sandoval MRN: [id number] Date of Birth: [birth year] Referring Physician: Marcelino Duster, M.D. Date of Service: 2023-10-28 Radiation Oncologist: Carmina Miller, M.D. 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  He was seen by ENT and found to have a right true cord lesion with fixation of the right true cord.  CT scan dreamt demonstrated apparent asymmetric enhancement of the right vocal cord with enhancement and mucosal thickening extending superior to the right preepiglottic fat suspicious for neoplasm.  No pathologic adenopathy neck was noted.  He had biopsy performed by ENT showing at least superficial invasive moderately differentiated squamous carcinoma with focal keratinization.  He has undergone a PET CT scan showing like marked hypermetabolic activity with the right laryngeal mass associated with the right true cord.  No evidence of subglottic extension metastatic adenopathy in the head and neck region.  Did have incidentally focal marked hypermetabolic activity in the periphery of the prostate gland.  His PSA recently was performed was in the 2 range.  He has been seen by medical oncology is now referred to radiation oncology for consideration of treatment.      ==========DELIVERED PLANS==========  First Treatment Date: 2023-08-24 Last Treatment Date: 2023-10-27   Plan Name: HN Site: Larynx Technique: IMRT Mode: Photon Dose Per Fraction: 2 Gy Prescribed Dose (Delivered / Prescribed): 70 Gy / 70 Gy Prescribed Fxs (Delivered / Prescribed): 35 / 35     ==========ON TREATMENT VISIT DATES========== 2023-08-25, 2023-08-31, 2023-09-07,  2023-09-13, 2023-09-22, 2023-10-13, 2023-10-20, 2023-10-26     ==========UPCOMING VISITS==========       ==========APPENDIX - ON TREATMENT VISIT NOTES==========   See weekly On Treatment Notes in Epic for details in the Media tab (listed as Progress notes on the On Treatment Visit Dates listed above).

## 2023-10-29 ENCOUNTER — Telehealth: Payer: Self-pay | Admitting: Speech Pathology

## 2023-10-29 NOTE — Telephone Encounter (Signed)
This Clinical research associate called and pt's wife answered. I explained nature of call, referral, role of ST.   Currently pt is fatigued from numerous appts having just completed radiation.   Spoke with pt;s wife about potential for scheduling pt on 11/15/2023 to coincide with appts at [facility].   This writer will contact pt again on Tuesday 11/02/2023 to confirm.   Sabino Denning B. Dreama Saa, M.S., CCC-SLP, Tree surgeon Certified Brain Injury Specialist [facility]  [facility] Office [phone] Ascom [phone] Fax [phone]

## 2023-11-03 ENCOUNTER — Ambulatory Visit: Payer: Medicare Other | Admitting: Urology

## 2023-11-15 ENCOUNTER — Ambulatory Visit: Payer: Medicare Other | Admitting: Radiation Oncology

## 2023-11-15 ENCOUNTER — Inpatient Hospital Stay: Payer: Medicare Other

## 2023-11-15 ENCOUNTER — Inpatient Hospital Stay: Payer: Medicare Other | Admitting: Oncology

## 2023-11-16 ENCOUNTER — Other Ambulatory Visit: Payer: Self-pay

## 2023-11-17 ENCOUNTER — Inpatient Hospital Stay: Payer: Medicare Other | Attending: Oncology

## 2023-11-17 ENCOUNTER — Encounter: Payer: Self-pay | Admitting: Oncology

## 2023-11-17 NOTE — Progress Notes (Signed)
Nutrition  Called patient for scheduled nutrition phone follow-up visit.  No answer on home or mobile number.  Left message with callback number on both voicemails.    Audre Cenci B. Freida Busman, RD, LDN Registered Dietitian [phone]

## 2023-11-19 ENCOUNTER — Other Ambulatory Visit: Payer: Medicare Other

## 2023-11-19 ENCOUNTER — Ambulatory Visit: Payer: Medicare Other | Admitting: Oncology

## 2023-11-20 ENCOUNTER — Other Ambulatory Visit: Payer: Self-pay

## 2023-11-23 ENCOUNTER — Other Ambulatory Visit: Payer: Self-pay

## 2023-11-26 ENCOUNTER — Other Ambulatory Visit: Payer: Medicare Other

## 2023-11-26 ENCOUNTER — Ambulatory Visit: Payer: Medicare Other | Admitting: Oncology

## 2023-11-30 DIAGNOSIS — H903 Sensorineural hearing loss, bilateral: Secondary | ICD-10-CM | POA: Diagnosis not present

## 2023-11-30 DIAGNOSIS — Z8521 Personal history of malignant neoplasm of larynx: Secondary | ICD-10-CM | POA: Diagnosis not present

## 2023-11-30 DIAGNOSIS — H90A22 Sensorineural hearing loss, unilateral, left ear, with restricted hearing on the contralateral side: Secondary | ICD-10-CM | POA: Diagnosis not present

## 2023-12-10 ENCOUNTER — Other Ambulatory Visit: Payer: Self-pay | Admitting: Oncology

## 2023-12-22 ENCOUNTER — Inpatient Hospital Stay (HOSPITAL_BASED_OUTPATIENT_CLINIC_OR_DEPARTMENT_OTHER): Payer: Medicare Other | Admitting: Oncology

## 2023-12-22 ENCOUNTER — Encounter: Payer: Self-pay | Admitting: Oncology

## 2023-12-22 ENCOUNTER — Inpatient Hospital Stay: Payer: Medicare Other | Attending: Oncology

## 2023-12-22 ENCOUNTER — Encounter: Payer: Self-pay | Admitting: Radiation Oncology

## 2023-12-22 ENCOUNTER — Ambulatory Visit
Admission: RE | Admit: 2023-12-22 | Discharge: 2023-12-22 | Disposition: A | Discharge status: Home / Self Care | Payer: Medicare Other | Source: Ambulatory Visit | Attending: Radiation Oncology | Admitting: Radiation Oncology

## 2023-12-22 VITALS — BP 155/78 | HR 82 | Resp 16 | Ht 73.0 in | Wt 173.2 lb

## 2023-12-22 VITALS — Ht 73.0 in | Wt 173.8 lb

## 2023-12-22 DIAGNOSIS — C320 Malignant neoplasm of glottis: Secondary | ICD-10-CM | POA: Diagnosis not present

## 2023-12-22 DIAGNOSIS — D696 Thrombocytopenia, unspecified: Secondary | ICD-10-CM | POA: Diagnosis not present

## 2023-12-22 DIAGNOSIS — R972 Elevated prostate specific antigen [PSA]: Secondary | ICD-10-CM | POA: Diagnosis not present

## 2023-12-22 DIAGNOSIS — Z9221 Personal history of antineoplastic chemotherapy: Secondary | ICD-10-CM | POA: Diagnosis not present

## 2023-12-22 DIAGNOSIS — N289 Disorder of kidney and ureter, unspecified: Secondary | ICD-10-CM | POA: Insufficient documentation

## 2023-12-22 DIAGNOSIS — F410 Panic disorder [episodic paroxysmal anxiety] without agoraphobia: Secondary | ICD-10-CM | POA: Diagnosis not present

## 2023-12-22 DIAGNOSIS — Z923 Personal history of irradiation: Secondary | ICD-10-CM | POA: Insufficient documentation

## 2023-12-22 DIAGNOSIS — I10 Essential (primary) hypertension: Secondary | ICD-10-CM | POA: Diagnosis not present

## 2023-12-22 DIAGNOSIS — D649 Anemia, unspecified: Secondary | ICD-10-CM | POA: Diagnosis not present

## 2023-12-22 DIAGNOSIS — Z8521 Personal history of malignant neoplasm of larynx: Secondary | ICD-10-CM | POA: Insufficient documentation

## 2023-12-22 LAB — CBC WITH DIFFERENTIAL/PLATELET
Abs Immature Granulocytes: 0.02 10*3/uL (ref 0.00–0.07)
Basophils Absolute: 0 10*3/uL (ref 0.0–0.1)
Basophils Relative: 0 %
Eosinophils Absolute: 0.1 10*3/uL (ref 0.0–0.5)
Eosinophils Relative: 2 %
HCT: 29.3 % — ABNORMAL LOW (ref 39.0–52.0)
Hemoglobin: 9.8 g/dL — ABNORMAL LOW (ref 13.0–17.0)
Immature Granulocytes: 1 %
Lymphocytes Relative: 39 %
Lymphs Abs: 1.6 10*3/uL (ref 0.7–4.0)
MCH: 32.8 pg (ref 26.0–34.0)
MCHC: 33.4 g/dL (ref 30.0–36.0)
MCV: 98 fL (ref 80.0–100.0)
Monocytes Absolute: 0.4 10*3/uL (ref 0.1–1.0)
Monocytes Relative: 10 %
Neutro Abs: 1.9 10*3/uL (ref 1.7–7.7)
Neutrophils Relative %: 48 %
Platelets: 143 10*3/uL — ABNORMAL LOW (ref 150–400)
RBC: 2.99 MIL/uL — ABNORMAL LOW (ref 4.22–5.81)
RDW: 11.9 % (ref 11.5–15.5)
WBC: 4 10*3/uL (ref 4.0–10.5)
nRBC: 0 % (ref 0.0–0.2)

## 2023-12-22 LAB — BASIC METABOLIC PANEL - CANCER CENTER ONLY
Anion gap: 7 (ref 5–15)
BUN: 24 mg/dL — ABNORMAL HIGH (ref 8–23)
CO2: 26 mmol/L (ref 22–32)
Calcium: 8.8 mg/dL — ABNORMAL LOW (ref 8.9–10.3)
Chloride: 105 mmol/L (ref 98–111)
Creatinine: 1.6 mg/dL — ABNORMAL HIGH (ref 0.61–1.24)
GFR, Estimated: 44 mL/min — ABNORMAL LOW (ref >60)
Glucose, Bld: 120 mg/dL — ABNORMAL HIGH (ref 70–99)
Potassium: 4.4 mmol/L (ref 3.5–5.1)
Sodium: 138 mmol/L (ref 135–145)

## 2023-12-22 LAB — MAGNESIUM: Magnesium: 1.6 mg/dL — ABNORMAL LOW (ref 1.7–2.4)

## 2023-12-22 NOTE — Progress Notes (Signed)
 Concerns about appetite. Still not able to eat much. Having problems with swallowing at times. Wants to review ENT notes from 2/26.

## 2023-12-22 NOTE — Progress Notes (Signed)
 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  INTERVAL HISTORY: Patient returns to clinic today for further evaluation and follow-up from completion of his chemo/XRT.  Clinic visit was delayed secondary to inclement weather 1 month ago.  Patient continues to have difficulty swallowing, but denies any pain.  He also has a poor appetite.  He otherwise feels well. He has no neurologic complaints.  He denies any recent fevers or illnesses.  He has a good appetite and denies weight loss.  He has no chest pain, cough, shortness of breath, or hemoptysis.  He denies any nausea, vomiting, constipation, or diarrhea.  He has no urinary complaints.  Patient offers no further specific complaints today.  REVIEW OF SYSTEMS:   Review of Systems  Constitutional: Negative.  Negative for fever, malaise/fatigue and weight loss.  HENT:  Negative for sore throat.   Respiratory: Negative.  Negative for cough, hemoptysis and shortness of breath.   Cardiovascular: Negative.  Negative for chest pain and leg swelling.  Gastrointestinal: Negative.  Negative for abdominal pain.  Genitourinary: Negative.  Negative for dysuria.  Musculoskeletal: Negative.  Negative for back pain.  Skin: Negative.  Negative for rash.  Neurological: Negative.  Negative for dizziness, focal weakness, weakness and headaches.  Psychiatric/Behavioral: Negative.  The patient is not nervous/anxious.     As per HPI. Otherwise, a complete review of systems is negative.  PAST MEDICAL HISTORY: Past Medical History:  Diagnosis Date   AAA (abdominal aortic aneurysm) (HCC)    a. 2012 3.4 cm; b. 07/2017 Abd u/s: Dist abd Ao dil up to  4.8cm; c. 03/2018 CT Abd: infrarenal AAA 5.3 x 5.7cm; d. 06/2018 EVAR (28mm prox/11mm dist, 18cm length Gore Excluder Endoprosthesis).   Arthritis    CAD (coronary artery disease)    a. 2007 Inf MI w/ BMS to RCA x 2; b. 2008 PCI/DES to mid LAD @ [facility]; c. 07/2015 Cath: LAD 100 prox to prev placed stent w/ R->L collats, RCA stents patent. EF 50-55%. Med Rx; d. 04/2018 MV: EF 48%, mid-dist ant/apical/apical-lat ischemia->med rx.   Cataracts, bilateral    COPD (chronic obstructive pulmonary disease) (HCC)    Diastolic dysfunction    a. 02/2014 Echo: EF 55-65%. Gr1 DD, no rwma, mildly dil LA, nl RV fxn; b. 01/2018 Echo: EF 60-65%, Gr1 DD, mild MR, mildly dil LA. nl RV fxn. Nl PASP.   ETOH abuse    Grade I diastolic dysfunction    Hepatic steatosis    a. 03/2018 CT Abd: hepatic steatosis and possible early cirrhotic morphology of liver.   History of heart attack    History of kidney stones    Hyperlipidemia    Hypertension    MI (myocardial infarction) (HCC)    Mild mitral regurgitation by prior echocardiogram    Obesity    Panic disorder    Panic disorder    Status post AAA (abdominal aortic aneurysm) repair    Tobacco abuse     PAST SURGICAL HISTORY: Past Surgical History:  Procedure Laterality Date   25 GAUGE PARS PLANA VITRECTOMY WITH 20 GAUGE MVR PORT FOR MACULAR HOLE Right 01/14/2021   Procedure: 25 GAUGE PARS PLANA VITRECTOMY WITH 20 GAUGE MVR PORT FOR MACULAR HOLE;  Surgeon: Sherrie George, MD;  Location: [facility] OR;  Service: Ophthalmology;  Laterality: Right;   AIR/FLUID EXCHANGE Right 01/14/2021   Procedure: AIR/FLUID EXCHANGE;  Surgeon: Sherrie George, MD;  Location: [facility] OR;  Service: Ophthalmology;  Laterality: Right;   BACK SURGERY     CARDIAC CATHETERIZATION  08/2006   x1 stent [facility]. Mid RCA: 3.5 x 16 mm overlap with 3.5 x 24 mm Liberte bare-metal stents   CARDIAC CATHETERIZATION  10/2006   x1 stent [facility]: Mid LAD: 2.5 x 28 mm Cypher drug-eluting   CARDIAC CATHETERIZATION N/A  07/25/2015   Procedure: Left Heart Cath and Coronary Angiography;  Surgeon: Iran Ouch, MD;  Location: [facility] INVASIVE CV LAB;  Service: Cardiovascular;  Laterality: N/A;   Cataract surgery Bilateral    COLONOSCOPY     coronary stents     ENDOVASCULAR REPAIR/STENT GRAFT N/A 06/08/2018   Procedure: ENDOVASCULAR REPAIR/STENT GRAFT;  Surgeon: Annice Needy, MD;  Location: [facility] INVASIVE CV LAB;  Service: Cardiovascular;  Laterality: N/A;   GAS INSERTION Right 01/14/2021   Procedure: INSERTION OF GAS;  Surgeon: Sherrie George, MD;  Location: [facility] OR;  Service: Ophthalmology;  Laterality: Right;   GAS/FLUID EXCHANGE Right 01/14/2021   Procedure: GAS/FLUID EXCHANGE;  Surgeon: Sherrie George, MD;  Location: [facility] OR;  Service: Ophthalmology;  Laterality: Right;   MEMBRANE PEEL Right 01/14/2021   Procedure: MEMBRANE PEEL;  Surgeon: Sherrie George, MD;  Location: [facility] OR;  Service: Ophthalmology;  Laterality: Right;   MICRODISCECTOMY LUMBAR     MICROLARYNGOSCOPY Bilateral 07/20/2023   Procedure: MICRODIRECT LARYNGOSCOPY WITH BIOPSY OF VOCAL CORD LESION;  Surgeon: Linus Salmons, MD;  Location: [facility] ORS;  Service: ENT;  Laterality: Bilateral;   PHOTOCOAGULATION WITH LASER Right 01/14/2021   Procedure: PHOTOCOAGULATION WITH LASER;  Surgeon: Sherrie George, MD;  Location: [facility] OR;  Service: Ophthalmology;  Laterality: Right;   SERUM PATCH Right 01/14/2021   Procedure: SERUM PATCH;  Surgeon: Sherrie George, MD;  Location: [facility] OR;  Service: Ophthalmology;  Laterality: Right;    FAMILY HISTORY: Family History  Problem Relation Age of Onset   Hypertension Mother    Heart disease Father     ADVANCED DIRECTIVES (Y/N):  N  HEALTH MAINTENANCE: Social History   Tobacco Use   Smoking status: Former    Current packs/day: 0.00    Average packs/day: 2.0 packs/day for 50.0 years (100.0 ttl pk-yrs)    Types: Cigarettes    Quit date: 07/20/2023    Years since quitting: 0.4   Smokeless tobacco: Never   Vaping Use   Vaping status: Never Used  Substance Use Topics   Alcohol use: Yes    Alcohol/week: 84.0 standard drinks of alcohol    Types: 84 Cans of beer per week    Comment: daily   Drug use: No     Colonoscopy:  PAP:  Bone density:  Lipid panel:  No Known Allergies  Current Outpatient Medications  Medication Sig Dispense Refill   ALPRAZolam (XANAX) 0.5 MG tablet Take 1 tablet (0.5 mg total) by mouth 4 (four) times daily as needed for anxiety. 90 tablet 0   atorvastatin (LIPITOR) 40 MG tablet Take 1 tablet (40 mg total) by mouth daily. 30 tablet 1   enalapril (VASOTEC) 20 MG tablet Take 1 tablet (20 mg total) by mouth 2 (two) times daily. 60 tablet 3   ketoconazole (NIZORAL) 2 % shampoo Apply 1 Application topically daily.     Multiple Vitamins-Minerals (PRESERVISION AREDS 2)  CAPS Take 1 tablet by mouth 2 (two) times daily.     aspirin EC 81 MG tablet Take 1 tablet (81 mg total) by mouth daily. (Patient not taking: Reported on 12/22/2023) 90 tablet 3   magic mouthwash (multi-ingredient) oral suspension Swish and swallow 5-10 mLs 4 (four) times daily as needed. (Patient not taking: Reported on 12/22/2023) 480 mL 3   nitroGLYCERIN (NITROSTAT) 0.4 MG SL tablet Place 1 tablet (0.4 mg total) under the tongue every 5 (five) minutes as needed for chest pain. (Patient not taking: Reported on 12/22/2023) 25 tablet 1   ondansetron (ZOFRAN) 8 MG tablet Take 1 tablet (8 mg total) by mouth every 8 (eight) hours as needed for nausea or vomiting. Start on the third day after cisplatin. (Patient not taking: Reported on 12/22/2023) 60 tablet 1   prochlorperazine (COMPAZINE) 10 MG tablet Take 1 tablet (10 mg total) by mouth every 6 (six) hours as needed (Nausea or vomiting). (Patient not taking: Reported on 08/23/2023) 60 tablet 1   sucralfate (CARAFATE) 1 g tablet TAKE ONE TABLET (1 GRAM TOTAL) BY MOUTH FOUR TIMES DAILY. (Patient not taking: Reported on 12/22/2023) 120 tablet 0   torsemide (DEMADEX) 20  MG tablet Take 20 mg by mouth daily as needed. (Patient not taking: Reported on 12/22/2023)     VENTOLIN HFA 108 (90 Base) MCG/ACT inhaler INHALE TWO PUFFS INTO THE LUNGS EVERY FOUR HOURS AS NEEDED FOR WHEEZING OR SHORTNESS OF BREATH (COUGH, SHORTNESS OF BREATH OR WHEEZING.). (Patient not taking: Reported on 12/22/2023) 18 each 1   No current facility-administered medications for this visit.    OBJECTIVE: There were no vitals filed for this visit.    Body mass index is 22.93 kg/m.    ECOG FS:1 - Symptomatic but completely ambulatory  General: Well-developed, well-nourished, no acute distress. Eyes: Pink conjunctiva, anicteric sclera. HEENT: Normocephalic, moist mucous membranes. Lungs: No audible wheezing or coughing. Heart: Regular rate and rhythm. Abdomen: Soft, nontender, no obvious distention. Musculoskeletal: No edema, cyanosis, or clubbing. Neuro: Alert, answering all questions appropriately. Cranial nerves grossly intact. Skin: No rashes or petechiae noted. Psych: Normal affect.   LAB RESULTS:  Lab Results  Component Value Date   NA 138 12/22/2023   K 4.4 12/22/2023   CL 105 12/22/2023   CO2 26 12/22/2023   GLUCOSE 120 (H) 12/22/2023   BUN 24 (H) 12/22/2023   CREATININE 1.60 (H) 12/22/2023   CALCIUM 8.8 (L) 12/22/2023   PROT 6.2 (L) 01/14/2021   ALBUMIN 3.8 01/14/2021   AST 22 01/14/2021   ALT 22 01/14/2021   ALKPHOS 63 01/14/2021   BILITOT 1.2 01/14/2021   GFRNONAA 44 (L) 12/22/2023   GFRAA >60 06/09/2018    Lab Results  Component Value Date   WBC 4.0 12/22/2023   NEUTROABS 1.9 12/22/2023   HGB 9.8 (L) 12/22/2023   HCT 29.3 (L) 12/22/2023   MCV 98.0 12/22/2023   PLT 143 (L) 12/22/2023     STUDIES: No results found.  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  PET scan results from August 02, 2023 reviewed  independently confirming stage of disease.  Case was discussed at tumor board and although total laryngectomy should be considered given the significant risk of chronic aspiration, patient adamantly refused surgical intervention.  Patient completed weekly cisplatin on October 20, 2023.  XRT completed 1 week later on  October 27, 2023.  Recent ENT evaluation did not reveal any recurrence of disease.  Will get a PET scan at the end of April and then patient will follow-up 1 week later to discuss the results.   Hypermetabolic prostate lesion: Although patient's PSA is only 3.73, this is highly suspicious for underlying malignancy.  Patient was seen by urology who plans to do a biopsy at the conclusion of his treatments for his head and neck cancer.  Repeat PET scan as above. Difficulty swallowing/poor appetite: Patient was given referrals to both dietary and speech pathology. Cough: Patient does not complain of this today.  Previously recommended patient take OTC guaifenesin. Panic attacks: Patient does not complain of this today.  Patient has been instructed to call his primary care regarding his Xanax dosing.   Thrombocytopenia: Mild, monitor. Neutropenia: Resolved. Anemia: Hemoglobin is trended down slightly to 9.8, monitor. Renal insufficiency: Creatinine slightly worse at 1.60 today.  Monitor.  Patient may require referral to nephrology if creatinine continues to trend up. Hypertension: Chronic and unchanged.  Continue monitoring and treatment per primary care.  Patient expressed understanding and was in agreement with this plan. He also understands that He can call clinic at any time with any questions, concerns, or complaints.    Cancer Staging  Squamous cell carcinoma of right vocal cord [facility]) Staging form: Larynx - Glottis, AJCC 8th Edition - Clinical stage from 08/06/2023: Stage III (cT3, cN0, cM0) - Signed by Jeralyn Ruths, MD on 08/06/2023 Stage prefix: Initial diagnosis   Jeralyn Ruths, MD   12/22/2023 11:40 AM

## 2023-12-22 NOTE — Progress Notes (Signed)
 Radiation Oncology Follow up Note  Name: Bobby Sandoval   Date:   12/22/2023 MRN:  [id number] DOB: [birth year]    This 78 y.o. male presents to the clinic today for 1 month follow-up status post concurrent chemoradiation therapy for stage III (cT3 N0 M0) squamous cell carcinoma of the right true cord.  REFERRING PROVIDER: Gracelyn Nurse, MD  HPI: Patient is a 78 year old male now out 1 month having completed concurrent chemoradiation therapy.  For stage III squamous cell carcinoma the right true cord.  Seen today in routine follow-up he is improving his voice quality has improved.  He was having some dysphagia although that seems also to be improving.  He has been seen by Dr. Jenne Campus who is pleased with his overall progress.  He does have a history of elevated PSA of close to 5 I have asked him to follow-up with Dr. Lonna Cobb about that for possible biopsy.  COMPLICATIONS OF TREATMENT: none  FOLLOW UP COMPLIANCE: keeps appointments   PHYSICAL EXAM:  BP (!) 155/78   Pulse 82   Resp 16   Ht 6\' 1"  (1.854 m)   Wt 173 lb 3.2 oz (78.6 kg)   BMI 22.85 kg/m  Oral cavity is clear no oral mucosal lesions are identified neck is clear without adenopathy noted.  Well-developed well-nourished patient in NAD. HEENT reveals PERLA, EOMI, discs not visualized.  Oral cavity is clear. No oral mucosal lesions are identified. Neck is clear without evidence of cervical or supraclavicular adenopathy. Lungs are clear to A&P. Cardiac examination is essentially unremarkable with regular rate and rhythm without murmur rub or thrill. Abdomen is benign with no organomegaly or masses noted. Motor sensory and DTR levels are equal and symmetric in the upper and lower extremities. Cranial nerves II through XII are grossly intact. Proprioception is intact. No peripheral adenopathy or edema is identified. No motor or sensory levels are noted. Crude visual fields are within normal range.  RADIOLOGY RESULTS: CT scan of the  soft tissue of the head and neck ordered for 3 months  PLAN: Present time patient is doing well from a head and neck standpoint.  I am pleased with his overall progress.  Of asked him to make a follow-up appoint with [facility] based on his elevated PSA.  He also continues close follow-up care with Dr. Orlie Dakin.  He also has monthly follow-up with exams with Dr. Jenne Campus.  I have asked to see him back in 3 to 4 months at which time we will review a CT scan of the soft tissue head and neck.  Patient and wife know to call with any concerns at any time.  I would like to take this opportunity to thank you for allowing me to participate in the care of your patient.Carmina Miller, MD

## 2023-12-23 ENCOUNTER — Other Ambulatory Visit: Payer: Self-pay

## 2023-12-29 ENCOUNTER — Other Ambulatory Visit

## 2024-01-26 ENCOUNTER — Ambulatory Visit
Admission: RE | Admit: 2024-01-26 | Discharge: 2024-01-26 | Disposition: A | Discharge status: Home / Self Care | Source: Ambulatory Visit | Attending: Oncology | Admitting: Oncology

## 2024-01-26 DIAGNOSIS — I700 Atherosclerosis of aorta: Secondary | ICD-10-CM | POA: Insufficient documentation

## 2024-01-26 DIAGNOSIS — N433 Hydrocele, unspecified: Secondary | ICD-10-CM | POA: Diagnosis not present

## 2024-01-26 DIAGNOSIS — J439 Emphysema, unspecified: Secondary | ICD-10-CM | POA: Diagnosis not present

## 2024-01-26 DIAGNOSIS — R918 Other nonspecific abnormal finding of lung field: Secondary | ICD-10-CM | POA: Insufficient documentation

## 2024-01-26 DIAGNOSIS — C320 Malignant neoplasm of glottis: Secondary | ICD-10-CM | POA: Diagnosis not present

## 2024-01-26 DIAGNOSIS — K8020 Calculus of gallbladder without cholecystitis without obstruction: Secondary | ICD-10-CM | POA: Diagnosis not present

## 2024-01-26 DIAGNOSIS — N200 Calculus of kidney: Secondary | ICD-10-CM | POA: Insufficient documentation

## 2024-01-26 LAB — GLUCOSE, CAPILLARY: Glucose-Capillary: 92 mg/dL (ref 70–99)

## 2024-01-26 MED ORDER — FLUDEOXYGLUCOSE F - 18 (FDG) INJECTION
9.4300 | Freq: Once | INTRAVENOUS | Status: AC | PRN
  Administered 2024-01-26: 9.43 via INTRAVENOUS

## 2024-02-01 ENCOUNTER — Other Ambulatory Visit: Payer: Self-pay | Admitting: *Deleted

## 2024-02-01 DIAGNOSIS — C320 Malignant neoplasm of glottis: Secondary | ICD-10-CM

## 2024-02-02 ENCOUNTER — Inpatient Hospital Stay (HOSPITAL_BASED_OUTPATIENT_CLINIC_OR_DEPARTMENT_OTHER): Admitting: Oncology

## 2024-02-02 ENCOUNTER — Encounter: Payer: Self-pay | Admitting: Oncology

## 2024-02-02 ENCOUNTER — Inpatient Hospital Stay: Attending: Oncology

## 2024-02-02 VITALS — HR 70 | Temp 97.6°F | Resp 16 | Wt 179.0 lb

## 2024-02-02 DIAGNOSIS — D649 Anemia, unspecified: Secondary | ICD-10-CM | POA: Insufficient documentation

## 2024-02-02 DIAGNOSIS — N429 Disorder of prostate, unspecified: Secondary | ICD-10-CM | POA: Diagnosis not present

## 2024-02-02 DIAGNOSIS — I10 Essential (primary) hypertension: Secondary | ICD-10-CM | POA: Diagnosis not present

## 2024-02-02 DIAGNOSIS — D696 Thrombocytopenia, unspecified: Secondary | ICD-10-CM | POA: Diagnosis not present

## 2024-02-02 DIAGNOSIS — N289 Disorder of kidney and ureter, unspecified: Secondary | ICD-10-CM | POA: Insufficient documentation

## 2024-02-02 DIAGNOSIS — C320 Malignant neoplasm of glottis: Secondary | ICD-10-CM

## 2024-02-02 DIAGNOSIS — Z8521 Personal history of malignant neoplasm of larynx: Secondary | ICD-10-CM | POA: Diagnosis not present

## 2024-02-02 DIAGNOSIS — N402 Nodular prostate without lower urinary tract symptoms: Secondary | ICD-10-CM | POA: Diagnosis not present

## 2024-02-02 DIAGNOSIS — Z87891 Personal history of nicotine dependence: Secondary | ICD-10-CM | POA: Insufficient documentation

## 2024-02-02 LAB — BASIC METABOLIC PANEL - CANCER CENTER ONLY
Anion gap: 7 (ref 5–15)
BUN: 15 mg/dL (ref 8–23)
CO2: 25 mmol/L (ref 22–32)
Calcium: 8.9 mg/dL (ref 8.9–10.3)
Chloride: 102 mmol/L (ref 98–111)
Creatinine: 1.42 mg/dL — ABNORMAL HIGH (ref 0.61–1.24)
GFR, Estimated: 50 mL/min — ABNORMAL LOW (ref >60)
Glucose, Bld: 110 mg/dL — ABNORMAL HIGH (ref 70–99)
Potassium: 4.7 mmol/L (ref 3.5–5.1)
Sodium: 134 mmol/L — ABNORMAL LOW (ref 135–145)

## 2024-02-02 LAB — CBC WITH DIFFERENTIAL/PLATELET
Abs Immature Granulocytes: 0.01 10*3/uL (ref 0.00–0.07)
Basophils Absolute: 0 10*3/uL (ref 0.0–0.1)
Basophils Relative: 1 %
Eosinophils Absolute: 0.2 10*3/uL (ref 0.0–0.5)
Eosinophils Relative: 4 %
HCT: 30.5 % — ABNORMAL LOW (ref 39.0–52.0)
Hemoglobin: 10.2 g/dL — ABNORMAL LOW (ref 13.0–17.0)
Immature Granulocytes: 0 %
Lymphocytes Relative: 27 %
Lymphs Abs: 1.1 10*3/uL (ref 0.7–4.0)
MCH: 31.4 pg (ref 26.0–34.0)
MCHC: 33.4 g/dL (ref 30.0–36.0)
MCV: 93.8 fL (ref 80.0–100.0)
Monocytes Absolute: 0.6 10*3/uL (ref 0.1–1.0)
Monocytes Relative: 15 %
Neutro Abs: 2.2 10*3/uL (ref 1.7–7.7)
Neutrophils Relative %: 53 %
Platelets: 131 10*3/uL — ABNORMAL LOW (ref 150–400)
RBC: 3.25 MIL/uL — ABNORMAL LOW (ref 4.22–5.81)
RDW: 13.1 % (ref 11.5–15.5)
WBC: 4.2 10*3/uL (ref 4.0–10.5)
nRBC: 0 % (ref 0.0–0.2)

## 2024-02-02 LAB — MAGNESIUM: Magnesium: 1.9 mg/dL (ref 1.7–2.4)

## 2024-02-02 MED ORDER — ALPRAZOLAM 0.5 MG PO TABS: 0.5000 mg | ORAL_TABLET | Freq: Three times a day (TID) | ORAL | 0 refills | Status: AC | PRN

## 2024-02-02 NOTE — Progress Notes (Signed)
 Patient has an appointment next week at his heart doctor, so he was wondering if Dr. Ronalee Cocking could add on a cholesterol check. He is doing about the same, He did have a PET scan on 01/26/2024.

## 2024-02-02 NOTE — Progress Notes (Signed)
 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  INTERVAL HISTORY: Patient returns to clinic today for repeat laboratory work, further evaluation, and discussion of his PET scan results.  He currently feels well and is asymptomatic.  He denies any dysphagia or difficulty swallowing.  His appetite has significantly improved.  He continues to have occasional panic attacks.  He has no neurologic complaints.  He denies any recent fevers or illnesses.  He has a good appetite and denies weight loss.  He has no chest pain, cough, shortness of breath, or hemoptysis.  He denies any nausea, vomiting, constipation, or diarrhea.  He has no urinary complaints.  Patient offers no further specific complaints today.  REVIEW OF SYSTEMS:   Review of Systems  Constitutional: Negative.  Negative for fever, malaise/fatigue and weight loss.  HENT:  Negative for sore throat.   Respiratory: Negative.  Negative for cough, hemoptysis and shortness of breath.   Cardiovascular: Negative.  Negative for chest pain and leg swelling.  Gastrointestinal: Negative.  Negative for abdominal pain.  Genitourinary: Negative.  Negative for dysuria.  Musculoskeletal: Negative.  Negative for back pain.  Skin: Negative.  Negative for rash.  Neurological: Negative.  Negative for dizziness, focal weakness, weakness and headaches.  Psychiatric/Behavioral: Negative.  The patient is not nervous/anxious.     As per HPI. Otherwise, a complete review of systems is negative.  PAST MEDICAL HISTORY: Past Medical History:  Diagnosis Date   AAA (abdominal aortic aneurysm) (HCC)     a. 2012 3.4 cm; b. 07/2017 Abd u/s: Dist abd Ao dil up to 4.8cm; c. 03/2018 CT Abd: infrarenal AAA 5.3 x 5.7cm; d. 06/2018 EVAR (28mm prox/66mm dist, 18cm length Gore Excluder Endoprosthesis).   Arthritis    CAD (coronary artery disease)    a. 2007 Inf MI w/ BMS to RCA x 2; b. 2008 PCI/DES to mid LAD @ [facility]; c. 07/2015 Cath: LAD 100 prox to prev placed stent w/ R->L collats, RCA stents patent. EF 50-55%. Med Rx; d. 04/2018 MV: EF 48%, mid-dist ant/apical/apical-lat ischemia->med rx.   Cataracts, bilateral    COPD (chronic obstructive pulmonary disease) (HCC)    Diastolic dysfunction    a. 02/2014 Echo: EF 55-65%. Gr1 DD, no rwma, mildly dil LA, nl RV fxn; b. 01/2018 Echo: EF 60-65%, Gr1 DD, mild MR, mildly dil LA. nl RV fxn. Nl PASP.   ETOH abuse    Grade I diastolic dysfunction    Hepatic steatosis    a. 03/2018 CT Abd: hepatic steatosis and possible early cirrhotic morphology of liver.   History of heart attack    History of kidney stones    Hyperlipidemia    Hypertension    MI (myocardial infarction) (HCC)    Mild mitral regurgitation by prior echocardiogram    Obesity    Panic disorder    Panic disorder    Status post AAA (abdominal aortic aneurysm) repair    Tobacco abuse     PAST SURGICAL HISTORY: Past Surgical History:  Procedure Laterality Date   25 GAUGE PARS PLANA VITRECTOMY WITH 20 GAUGE MVR PORT FOR MACULAR HOLE Right 01/14/2021   Procedure: 25 GAUGE PARS PLANA VITRECTOMY WITH  20 GAUGE MVR PORT FOR MACULAR HOLE;  Surgeon: Rexene Catching, MD;  Location: [facility] OR;  Service: Ophthalmology;  Laterality: Right;   AIR/FLUID EXCHANGE Right 01/14/2021   Procedure: AIR/FLUID EXCHANGE;  Surgeon: Rexene Catching, MD;  Location: [facility] OR;  Service: Ophthalmology;  Laterality: Right;   BACK SURGERY     CARDIAC CATHETERIZATION  08/2006   x1 stent [facility]. Mid RCA: 3.5 x 16 mm overlap with 3.5 x 24 mm Liberte bare-metal stents   CARDIAC CATHETERIZATION  10/2006   x1 stent [facility]: Mid LAD: 2.5 x 28  mm Cypher drug-eluting   CARDIAC CATHETERIZATION N/A 07/25/2015   Procedure: Left Heart Cath and Coronary Angiography;  Surgeon: Wenona Hamilton, MD;  Location: [facility] INVASIVE CV LAB;  Service: Cardiovascular;  Laterality: N/A;   Cataract surgery Bilateral    COLONOSCOPY     coronary stents     ENDOVASCULAR REPAIR/STENT GRAFT N/A 06/08/2018   Procedure: ENDOVASCULAR REPAIR/STENT GRAFT;  Surgeon: Celso College, MD;  Location: [facility] INVASIVE CV LAB;  Service: Cardiovascular;  Laterality: N/A;   GAS INSERTION Right 01/14/2021   Procedure: INSERTION OF GAS;  Surgeon: Rexene Catching, MD;  Location: [facility] OR;  Service: Ophthalmology;  Laterality: Right;   GAS/FLUID EXCHANGE Right 01/14/2021   Procedure: GAS/FLUID EXCHANGE;  Surgeon: Rexene Catching, MD;  Location: [facility] OR;  Service: Ophthalmology;  Laterality: Right;   MEMBRANE PEEL Right 01/14/2021   Procedure: MEMBRANE PEEL;  Surgeon: Rexene Catching, MD;  Location: [facility] OR;  Service: Ophthalmology;  Laterality: Right;   MICRODISCECTOMY LUMBAR     MICROLARYNGOSCOPY Bilateral 07/20/2023   Procedure: MICRODIRECT LARYNGOSCOPY WITH BIOPSY OF VOCAL CORD LESION;  Surgeon: Lesly Raspberry, MD;  Location: [facility] ORS;  Service: ENT;  Laterality: Bilateral;   PHOTOCOAGULATION WITH LASER Right 01/14/2021   Procedure: PHOTOCOAGULATION WITH LASER;  Surgeon: Rexene Catching, MD;  Location: [facility] OR;  Service: Ophthalmology;  Laterality: Right;   SERUM PATCH Right 01/14/2021   Procedure: SERUM PATCH;  Surgeon: Rexene Catching, MD;  Location: [facility] OR;  Service: Ophthalmology;  Laterality: Right;    FAMILY HISTORY: Family History  Problem Relation Age of Onset   Hypertension Mother    Heart disease Father     ADVANCED DIRECTIVES (Y/N):  N  HEALTH MAINTENANCE: Social History   Tobacco Use   Smoking status: Former    Current packs/day: 0.00    Average packs/day: 2.0 packs/day for 50.0 years (100.0 ttl pk-yrs)    Types: Cigarettes    Quit date: 07/20/2023     Years since quitting: 0.5   Smokeless tobacco: Never  Vaping Use   Vaping status: Never Used  Substance Use Topics   Alcohol use: Yes    Alcohol/week: 84.0 standard drinks of alcohol    Types: 84 Cans of beer per week    Comment: daily   Drug use: No     Colonoscopy:  PAP:  Bone density:  Lipid panel:  No Known Allergies  Current Outpatient Medications  Medication Sig Dispense Refill   atorvastatin  (LIPITOR) 40 MG tablet Take 1 tablet (40 mg total) by mouth daily. 30 tablet 1   enalapril  (VASOTEC ) 20 MG tablet Take 1 tablet (20 mg total) by mouth 2 (two) times daily. 60 tablet 3   ketoconazole (NIZORAL) 2 % shampoo Apply 1 Application topically daily.     Multiple Vitamins-Minerals (PRESERVISION AREDS 2) CAPS Take 1 tablet by mouth 2 (two) times daily.     ALPRAZolam  (XANAX ) 0.5 MG  tablet Take 1 tablet (0.5 mg total) by mouth 3 (three) times daily as needed for anxiety. 90 tablet 0   aspirin  EC 81 MG tablet Take 1 tablet (81 mg total) by mouth daily. (Patient not taking: Reported on 12/22/2023) 90 tablet 3   magic mouthwash (multi-ingredient) oral suspension Swish and swallow 5-10 mLs 4 (four) times daily as needed. (Patient not taking: Reported on 10/11/2023) 480 mL 3   nitroGLYCERIN  (NITROSTAT ) 0.4 MG SL tablet Place 1 tablet (0.4 mg total) under the tongue every 5 (five) minutes as needed for chest pain. (Patient not taking: Reported on 02/02/2024) 25 tablet 1   ondansetron  (ZOFRAN ) 8 MG tablet Take 1 tablet (8 mg total) by mouth every 8 (eight) hours as needed for nausea or vomiting. Start on the third day after cisplatin . (Patient not taking: Reported on 02/02/2024) 60 tablet 1   prochlorperazine  (COMPAZINE ) 10 MG tablet Take 1 tablet (10 mg total) by mouth every 6 (six) hours as needed (Nausea or vomiting). (Patient not taking: Reported on 02/02/2024) 60 tablet 1   sucralfate  (CARAFATE ) 1 g tablet TAKE ONE TABLET (1 GRAM TOTAL) BY MOUTH FOUR TIMES DAILY. (Patient not taking: Reported  on 02/02/2024) 120 tablet 0   torsemide  (DEMADEX ) 20 MG tablet Take 20 mg by mouth daily as needed. (Patient not taking: Reported on 02/02/2024)     VENTOLIN  HFA 108 (90 Base) MCG/ACT inhaler INHALE TWO PUFFS INTO THE LUNGS EVERY FOUR HOURS AS NEEDED FOR WHEEZING OR SHORTNESS OF BREATH (COUGH, SHORTNESS OF BREATH OR WHEEZING.). (Patient not taking: Reported on 02/02/2024) 18 each 1   No current facility-administered medications for this visit.    OBJECTIVE: Vitals:   02/02/24 1319  Pulse: 70  Resp: 16  Temp: 97.6 F (36.4 C)  SpO2: 96%      Body mass index is 23.62 kg/m.    ECOG FS:0 - Asymptomatic  General: Well-developed, well-nourished, no acute distress. Eyes: Pink conjunctiva, anicteric sclera. HEENT: Normocephalic, moist mucous membranes.  No palpable lymphadenopathy. Lungs: No audible wheezing or coughing. Heart: Regular rate and rhythm. Abdomen: Soft, nontender, no obvious distention. Musculoskeletal: No edema, cyanosis, or clubbing. Neuro: Alert, answering all questions appropriately. Cranial nerves grossly intact. Skin: No rashes or petechiae noted. Psych: Normal affect.  LAB RESULTS:  Lab Results  Component Value Date   NA 134 (L) 02/02/2024   K 4.7 02/02/2024   CL 102 02/02/2024   CO2 25 02/02/2024   GLUCOSE 110 (H) 02/02/2024   BUN 15 02/02/2024   CREATININE 1.42 (H) 02/02/2024   CALCIUM  8.9 02/02/2024   PROT 6.2 (L) 01/14/2021   ALBUMIN 3.8 01/14/2021   AST 22 01/14/2021   ALT 22 01/14/2021   ALKPHOS 63 01/14/2021   BILITOT 1.2 01/14/2021   GFRNONAA 50 (L) 02/02/2024   GFRAA >60 06/09/2018    Lab Results  Component Value Date   WBC 4.2 02/02/2024   NEUTROABS 2.2 02/02/2024   HGB 10.2 (L) 02/02/2024   HCT 30.5 (L) 02/02/2024   MCV 93.8 02/02/2024   PLT 131 (L) 02/02/2024     STUDIES: NM PET Image Restag (PS) Skull Base To Thigh Result Date: 02/01/2024 CLINICAL DATA:  Subsequent treatment strategy for squamous cell carcinoma of the right focal  cord. EXAM: NUCLEAR MEDICINE PET SKULL BASE TO THIGH TECHNIQUE: 9.4 mCi F-18 FDG was injected intravenously. Full-ring PET imaging was performed from the skull base to thigh after the radiotracer. CT data was obtained and used for attenuation correction and anatomic localization. Fasting  blood glucose: 92 mg/dl COMPARISON:  16/07/9603 FINDINGS: Mediastinal blood pool activity: SUV max 2.2 Liver activity: SUV max NA NECK: The previous abnormal hypermetabolic activity was along the right anterior cord and right paralaryngeal space anteriorly has essentially resolved, maximum SUV in this vicinity 3.0 and formerly 15.1. The right focal cord does continue to appear mildly thickened with some slight effacement of the right paralaryngeal adipose tissues, for example on image 30 series 6. However, there is high activity along the posterior cricoid and arytenoid region with SUV 8.7 on the right and 8.9 on the left. This does not appear to correspond with the prior tumor and may represent physiologic activity. No regional hypermetabolic adenopathy. Incidental CT findings: Bilateral common carotid atherosclerosis. CHEST: Newly appreciable part solid 0.9 by 0.8 cm left upper lobe nodule anteriorly on image 62 series 6, solid portion about 0.6 by 0.4 cm, maximum SUV 1.7. Subsolid 3 by 8 mm left upper lobe nodule on image 46 of series 6 likewise appears new, maximum SUV 1.1. Incidental CT findings: Mild atelectasis posteromedially in the left lower lobe. Substantial emphysema. Coronary, aortic arch, and branch vessel atherosclerotic vascular disease. Mild cardiomegaly. Incidental lipoma along the right paraspinal musculature posteriorly on image 59 series 6. ABDOMEN/PELVIS: Presumed physiologic activity in bowel. Persistent prominent left posterolateral peripheral zone activity in the prostate gland, maximum SUV 9.8 and formerly 13.2, prostate cancer is a distinct possibility. Incidental CT findings: Cholelithiasis.  Atherosclerosis is present, including aortoiliac atherosclerotic disease. Aorta bi-iliac stent graft traversing known abdominal aortic aneurysm. Bilateral nonobstructive nephrolithiasis. Atherosclerotic plaque in the superior mesenteric artery. Sigmoid colon diverticular scotch that substantial sigmoid colon diverticulosis. Complex right scrotal hydrocele. SKELETON: No significant abnormal hypermetabolic activity in this region. Incidental CT findings: Left mastoid effusion. Lumbar spondylosis and degenerative disc disease with Schmorl's nodes at L2-3 and L4-5. IMPRESSION: 1. The previous abnormal hypermetabolic activity along the right anterior cord and right paralaryngeal space anteriorly has essentially resolved, maximum SUV in this vicinity currently 3.0 and formerly 15.1. The right focal cord does continue to appear mildly thickened with some slight effacement of the right paralaryngeal adipose tissues. 2. High activity along the posterior cricoid and arytenoid region bilaterally, maximum SUV 8.7 on the right and 8.9 on the left. This does not appear to correspond with the prior tumor and may represent physiologic activity. 3. Newly appreciable part solid and subsolid left upper lobe nodules, maximum SUV 1.7. These are nonspecific and probably inflammatory although surveillance is suggested. 4. Persistent prominent left posterolateral peripheral zone activity in the prostate gland, maximum SUV 9.8. Prostate cancer is a distinct possibility. 5. Cholelithiasis. 6. Bilateral nonobstructive nephrolithiasis. 7. Complex persistent right scrotal hydrocele. 8. Left mastoid effusion. 9. Aortic Atherosclerosis (ICD10-I70.0) and Emphysema (ICD10-J43.9). Electronically Signed   By: Freida Jes M.D.   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   Initially, case was discussed at tumor board and although total laryngectomy should be considered given the significant risk of chronic aspiration, patient adamantly refused surgical intervention.  Patient completed weekly cisplatin  on October 20, 2023.  XRT completed 1 week later on October 27, 2023.  Recent ENT evaluation did not reveal any recurrence of disease.  PET scan results from February 01, 2024 reviewed independently and report as above with no evidence of malignancy.  No further imaging is necessary unless  there is suspicion of recurrence.  Patient has been instructed to keep his follow-up with ENT as scheduled.  Return to clinic in 6 months for routine evaluation.     Hypermetabolic prostate lesion: PET scan results as above.  Patient's PSA continues to trend up and is now 4.92.  Today's result is pending.  A referral has been sent back to urology for consideration of biopsy.   Difficulty swallowing/poor appetite: Resolved.   Panic attacks: Intermittent.  Patient was given a prescription for alprazolam  to use as needed.  Patient has been instructed that any further refills should come from his primary care physician.   Thrombocytopenia: Chronic and unchanged. Anemia: Chronic and unchanged.  Patient's hemoglobin is 10.2 today.   Renal insufficiency: Creatinine improved to 1.42.  Monitor. Hypertension: Chronic and unchanged.  Continue monitoring and treatment per primary care.  Patient expressed understanding and was in agreement with this plan. He also understands that He can call clinic at any time with any questions, concerns, or complaints.    Cancer Staging  Squamous cell carcinoma of right vocal cord [facility]) Staging form: Larynx - Glottis, AJCC 8th Edition - Clinical stage from 08/06/2023: Stage III (cT3, cN0, cM0) - Signed by Shellie Dials, MD on 08/06/2023 Stage prefix: Initial diagnosis   Shellie Dials, MD   02/02/2024 2:52 PM

## 2024-02-03 ENCOUNTER — Other Ambulatory Visit: Payer: Self-pay

## 2024-02-09 ENCOUNTER — Ambulatory Visit: Payer: Medicare Other | Attending: Nurse Practitioner | Admitting: Nurse Practitioner

## 2024-02-09 ENCOUNTER — Encounter: Payer: Self-pay | Admitting: Nurse Practitioner

## 2024-02-09 VITALS — BP 172/82 | HR 62 | Resp 19 | Ht 72.0 in | Wt 178.0 lb

## 2024-02-09 DIAGNOSIS — I2510 Atherosclerotic heart disease of native coronary artery without angina pectoris: Secondary | ICD-10-CM

## 2024-02-09 DIAGNOSIS — F1010 Alcohol abuse, uncomplicated: Secondary | ICD-10-CM

## 2024-02-09 DIAGNOSIS — N183 Chronic kidney disease, stage 3 unspecified: Secondary | ICD-10-CM

## 2024-02-09 DIAGNOSIS — R0609 Other forms of dyspnea: Secondary | ICD-10-CM

## 2024-02-09 DIAGNOSIS — I714 Abdominal aortic aneurysm, without rupture, unspecified: Secondary | ICD-10-CM

## 2024-02-09 DIAGNOSIS — Z720 Tobacco use: Secondary | ICD-10-CM

## 2024-02-09 DIAGNOSIS — I5032 Chronic diastolic (congestive) heart failure: Secondary | ICD-10-CM

## 2024-02-09 DIAGNOSIS — E785 Hyperlipidemia, unspecified: Secondary | ICD-10-CM

## 2024-02-09 MED ORDER — CARVEDILOL 3.125 MG PO TABS
3.1250 mg | ORAL_TABLET | Freq: Two times a day (BID) | ORAL | 3 refills | Status: DC
Start: 2024-02-09 — End: 2024-06-16

## 2024-02-09 NOTE — Progress Notes (Signed)
 Office Visit    Patient Name: Bobby Sandoval Date of Encounter: 02/09/2024  Primary Care Provider:  Little Riff, MD Primary Cardiologist:  Antionette Kirks, MD  Chief Complaint    78 y.o. male with a history of CAD status post prior RCA and LAD stenting, chronic HFpEF, alcohol abuse, COPD, hypertension, hyperlipidemia, tobacco abuse, obesity, abdominal aortic aneurysm status post endograft, and laryngeal cancer, who presents for HTN and dyspnea.  Past Medical History  Subjective   Past Medical History:  Diagnosis Date   AAA (abdominal aortic aneurysm) (HCC)    a. 2012 3.4 cm; b. 07/2017 Abd u/s: Dist abd Ao dil up to 4.8cm; c. 03/2018 CT Abd: infrarenal AAA 5.3 x 5.7cm; d. 06/2018 EVAR (28mm prox/53mm dist, 18cm length Gore Excluder Endoprosthesis).   Arthritis    CAD (coronary artery disease)    a. 2007 Inf MI w/ BMS to RCA x 2; b. 2008 PCI/DES to mid LAD @ [facility]; c. 07/2015 Cath: LAD 100 prox to prev placed stent w/ R->L collats, RCA stents patent. EF 50-55%. Med Rx; d. 04/2018 MV: EF 48%, mid-dist ant/apical/apical-lat ischemia->med rx.   Cataracts, bilateral    COPD (chronic obstructive pulmonary disease) (HCC)    Diastolic dysfunction    a. 02/2014 Echo: EF 55-65%. Gr1 DD, no rwma, mildly dil LA, nl RV fxn; b. 01/2018 Echo: EF 60-65%, Gr1 DD, mild MR, mildly dil LA. nl RV fxn. Nl PASP.   ETOH abuse    Grade I diastolic dysfunction    Hepatic steatosis    a. 03/2018 CT Abd: hepatic steatosis and possible early cirrhotic morphology of liver.   History of heart attack    History of kidney stones    Hyperlipidemia    Hypertension    MI (myocardial infarction) (HCC)    Mild mitral regurgitation by prior echocardiogram    Obesity    Panic disorder    Panic disorder    Status post AAA (abdominal aortic aneurysm) repair    Tobacco abuse    Past Surgical History:  Procedure Laterality Date   25 GAUGE PARS PLANA VITRECTOMY WITH 20 GAUGE MVR PORT FOR MACULAR HOLE Right  01/14/2021   Procedure: 25 GAUGE PARS PLANA VITRECTOMY WITH 20 GAUGE MVR PORT FOR MACULAR HOLE;  Surgeon: Rexene Catching, MD;  Location: [facility] OR;  Service: Ophthalmology;  Laterality: Right;   AIR/FLUID EXCHANGE Right 01/14/2021   Procedure: AIR/FLUID EXCHANGE;  Surgeon: Rexene Catching, MD;  Location: [facility] OR;  Service: Ophthalmology;  Laterality: Right;   BACK SURGERY     CARDIAC CATHETERIZATION  08/2006   x1 stent [facility]. Mid RCA: 3.5 x 16 mm overlap with 3.5 x 24 mm Liberte bare-metal stents   CARDIAC CATHETERIZATION  10/2006   x1 stent [facility]: Mid LAD: 2.5 x 28 mm Cypher drug-eluting   CARDIAC CATHETERIZATION N/A 07/25/2015   Procedure: Left Heart Cath and Coronary Angiography;  Surgeon: Wenona Hamilton, MD;  Location: [facility] INVASIVE CV LAB;  Service: Cardiovascular;  Laterality: N/A;   Cataract surgery Bilateral    COLONOSCOPY     coronary stents     ENDOVASCULAR REPAIR/STENT GRAFT N/A 06/08/2018   Procedure: ENDOVASCULAR REPAIR/STENT GRAFT;  Surgeon: Celso College, MD;  Location: [facility] INVASIVE CV LAB;  Service: Cardiovascular;  Laterality: N/A;   GAS INSERTION Right 01/14/2021   Procedure: INSERTION OF GAS;  Surgeon: Rexene Catching, MD;  Location: [facility] OR;  Service: Ophthalmology;  Laterality: Right;   GAS/FLUID EXCHANGE Right 01/14/2021  Procedure: GAS/FLUID EXCHANGE;  Surgeon: Rexene Catching, MD;  Location: [facility] OR;  Service: Ophthalmology;  Laterality: Right;   MEMBRANE PEEL Right 01/14/2021   Procedure: MEMBRANE PEEL;  Surgeon: Rexene Catching, MD;  Location[facility] OR;  Service: Ophthalmology;  Laterality: Right;   MICRODISCECTOMY LUMBAR     MICROLARYNGOSCOPY Bilateral 07/20/2023   Procedure: MICRODIRECT LARYNGOSCOPY WITH BIOPSY OF VOCAL CORD LESION;  Surgeon: Lesly Raspberry, MD;  Location: [facility] ORS;  Service: ENT;  Laterality: Bilateral;   PHOTOCOAGULATION WITH LASER Right 01/14/2021   Procedure: PHOTOCOAGULATION WITH LASER;  Surgeon: Rexene Catching, MD;  Location: [facility] OR;  Service:  Ophthalmology;  Laterality: Right;   SERUM PATCH Right 01/14/2021   Procedure: SERUM PATCH;  Surgeon: Rexene Catching, MD;  Location: [facility] OR;  Service: Ophthalmology;  Laterality: Right;    Allergies  No Known Allergies    History of Present Illness      78 y.o. y/o male with a history of CAD, HFpEF, alcohol abuse, COPD, hypertension, hyperlipidemia, tobacco abuse, obesity, abdominal aortic aneurysm status post endograft, and laryngeal cancer.  He previously suffered an inferior MI and underwent bare-metal stenting of the RCA x 2 in 2007.  He subsequently required PCI and drug-eluting stent placement to the mid LAD in 2008.  Diagnostic catheterization in October 2016 showed an occluded mid LAD, proximal to the previously placed stent, with diffuse disease and right to left collaterals.  RCA stents were patent.  He was medically managed.  Most recent echo in April 2019, showed normal LV function with grade 1 diastolic dysfunction and without any significant valvular abnormalities.  In September 2019, he underwent endovascular repair of abdominal aortic aneurysm with stent graft.  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   He refused surgical intervention and completed cisplatin  and XRT.  PET in 02/01/2024, w/o evidence of laryngeal malignancy but he continues to have a hypermetabolic prostate lesion w/ upward trending PSA and recommendation for prostate biopsy.     Bobby Sandoval was last seen in cardiology clinic in 08/2023, at which time he was relatively stable.  As outlined above, he has since undergone radiation and chemotherapy.  He thinks he tolerated these well overall, though his wife says he seems more fatigued.  He has noted some dyspnea on exertion while trying to do things he is usually able to do in the spring such as playing his tomatoes.  He has not had any chest pain.  He has chronic, mild ankle edema, left greater than right.   He takes torsemide  sparingly.  His biggest concern today is regarding his anxiety and his Xanax  dose, which has been reduced by his primary care team.  He thinks as a result of this, he has been more on edge than usual.  He denies palpitations, PND, orthopnea, dizziness, syncope, or early satiety.  He is pending prostate biopsy in June. Objective  Home Medications    Current Outpatient Medications  Medication Sig Dispense Refill   carvedilol (COREG) 3.125 MG tablet Take 1 tablet (3.125 mg total) by mouth 2 (two) times daily with a meal. 180 tablet 3   torsemide  (DEMADEX ) 20 MG tablet Take 20 mg by mouth daily as needed.     ALPRAZolam  (XANAX ) 0.5 MG tablet Take 1 tablet (0.5 mg total) by mouth 3 (three) times daily as needed for anxiety. 90 tablet 0   aspirin  EC 81 MG tablet Take 1 tablet (  81 mg total) by mouth daily. (Patient not taking: Reported on 12/22/2023) 90 tablet 3   atorvastatin  (LIPITOR) 40 MG tablet Take 1 tablet (40 mg total) by mouth daily. 30 tablet 1   enalapril  (VASOTEC ) 20 MG tablet Take 1 tablet (20 mg total) by mouth 2 (two) times daily. 60 tablet 3   ketoconazole (NIZORAL) 2 % shampoo Apply 1 Application topically daily.     magic mouthwash (multi-ingredient) oral suspension Swish and swallow 5-10 mLs 4 (four) times daily as needed. (Patient not taking: Reported on 10/11/2023) 480 mL 3   Multiple Vitamins-Minerals (PRESERVISION AREDS 2) CAPS Take 1 tablet by mouth 2 (two) times daily.     nitroGLYCERIN  (NITROSTAT ) 0.4 MG SL tablet Place 1 tablet (0.4 mg total) under the tongue every 5 (five) minutes as needed for chest pain. (Patient not taking: Reported on 02/02/2024) 25 tablet 1   ondansetron  (ZOFRAN ) 8 MG tablet Take 1 tablet (8 mg total) by mouth every 8 (eight) hours as needed for nausea or vomiting. Start on the third day after cisplatin . (Patient not taking: Reported on 02/02/2024) 60 tablet 1   prochlorperazine  (COMPAZINE ) 10 MG tablet Take 1 tablet (10 mg total) by mouth  every 6 (six) hours as needed (Nausea or vomiting). (Patient not taking: Reported on 02/02/2024) 60 tablet 1   sucralfate  (CARAFATE ) 1 g tablet TAKE ONE TABLET (1 GRAM TOTAL) BY MOUTH FOUR TIMES DAILY. (Patient not taking: Reported on 02/02/2024) 120 tablet 0   VENTOLIN  HFA 108 (90 Base) MCG/ACT inhaler INHALE TWO PUFFS INTO THE LUNGS EVERY FOUR HOURS AS NEEDED FOR WHEEZING OR SHORTNESS OF BREATH (COUGH, SHORTNESS OF BREATH OR WHEEZING.). (Patient not taking: Reported on 02/02/2024) 18 each 1   No current facility-administered medications for this visit.     Physical Exam    VS:  BP (!) 172/82   Pulse 62   Resp 19   Ht 6' (1.829 m)   Wt 178 lb (80.7 kg)   SpO2 92%   BMI 24.14 kg/m  , BMI Body mass index is 24.14 kg/m.       GEN: Well nourished, well developed, in no acute distress. HEENT: normal. Neck: Supple, no JVD, carotid bruits, or masses. Cardiac: RRR, no murmurs, rubs, or gallops. No clubbing, cyanosis, 1+ right ankle, trace left ankle edema.  Radials 2+/PT 2+ and equal bilaterally.  Respiratory:  Respirations regular and unlabored, clear to auscultation bilaterally. GI: Soft, nontender, nondistended, BS + x 4. MS: no deformity or atrophy. Skin: warm and dry, no rash. Neuro:  Strength and sensation are intact. Psych: Normal affect.  Accessory Clinical Findings    ECG personally reviewed by me today - EKG Interpretation Date/Time:  Wednesday Feb 09 2024 09:03:51 EDT Ventricular Rate:  62 PR Interval:  190 QRS Duration:  98 QT Interval:  416 QTC Calculation: 422 R Axis:   11  Text Interpretation: Sinus rhythm with Premature atrial complexes Possible Anterior infarct , age undetermined Confirmed by Laneta Pintos [phone]) on 02/09/2024 9:13:43 AM  - no acute changes.  Lab Results  Component Value Date   WBC 4.2 02/02/2024   HGB 10.2 (L) 02/02/2024   HCT 30.5 (L) 02/02/2024   MCV 93.8 02/02/2024   PLT 131 (L) 02/02/2024   Lab Results  Component Value Date   NA  134 (L) 02/02/2024   CL 102 02/02/2024   K 4.7 02/02/2024   CO2 25 02/02/2024   BUN 15 02/02/2024   CREATININE 1.42 (H) 02/02/2024  GFRNONAA 50 (L) 02/02/2024   CALCIUM  8.9 02/02/2024   ALBUMIN 3.8 01/14/2021   GLUCOSE 110 (H) 02/02/2024    Labs dated 07/01/2023 from Care Everywhere:   Hemoglobin 16.2, hematocrit 46.5, WBC 4.9, platelets 179 Sodium 136, potassium 5.0, chloride 100, CO2 27.9, BUN 7, creatinine 0.8, glucose 106 Calcium  9.2, albumin 4.1, total protein 6.2 Total bilirubin 0.9, alkaline phosphatase 61, AST 19, ALT 14 Total cholesterol 208, triglycerides 47, HDL 64.8, LDL 134 PSA 2.47    Assessment & Plan    1.  Coronary artery disease: Status post prior inferior MI with RCA stent in 2007, and subsequent LAD stenting in 2008, with finding of occluded LAD in October 2016.  He has since been medically managed.  He does not experience chest pain but has noted some dyspnea on exertion over the past few months, which is out of character for him.  As result, I am going to order an echocardiogram today.  He remains on aspirin , which he says he only takes a few days a week or sometimes a few times a month.  We resumed atorvastatin  has last visit and he confirmed that he is taking today.  He reports compliance with his enalapril  in the setting of hypertension, I am adding low-dose carvedilol today.  2.  Chronic HFpEF/dyspnea on exertion: Most recent echo in 2019 showed an EF of 60 to 65% with grade 1 diastolic dysfunction and mild MR.  He has been more short of breath over the past few months.  He has mild right greater than left ankle edema, which she says is chronic and though he has torsemide  available to him at home, he uses sparingly.  His weight is down significantly compared to the last time he was here in the setting of chemotherapy and radiation earlier in the year.  Follow-up echocardiogram.  Adding low-dose carvedilol in the setting of hypertension.  Continue ACE inhibitor and  as needed torsemide  therapy.  With CKD 3, I encouraged him to keep his legs elevated, reduce sodium intake, and wear compression socks in order to limit his exposure to torsemide  if possible.  3.  Primary hypertension: Blood pressure elevated today on 2 consecutive readings.  Review of records over the past several months show that he is typically trending high.  He reports compliance with his enalapril .  Adding carvedilol 3.125 mg twice daily.  4.  Hyperlipidemia: LDL 134 last September, when he was not taking atorvastatin .  He says he subsequently resumed and notes compliance.  He plans to follow-up lipids with his primary care provider at his annual physical in the future.  5.  Laryngeal cancer/hypermetabolic prostate lesion: Diagnosed in 2024.  Status post chemotherapy and radiation with recent PET scan without evidence of laryngeal malignancy though persistent hypermetabolic area in the prostate.  He is followed closely by oncology and is pending prostate biopsy.  He has not been having any chest pain.  I am following up an echocardiogram in the setting of dyspnea on exertion but barring significant abnormality, would anticipate that he may proceed with biopsy without additional ischemic testing.  He is prescribed aspirin  though notes that he takes this sparingly.  If necessary, this could be held for 7 days prior to the biopsy.  He should continue beta-blocker and statin therapy throughout the periprocedural period.  6.  Tobacco abuse: Quit in October 2024.  7.  Alcohol abuse: Continues to drink.  Complete cessation advised.  8.  Abdominal aortic aneurysm: Status post endovascular pair.  Followed by vascular surgery.  9.  Anxiety/panic attacks: Patient very concerned about worsening anxiety in the setting of reduced Xanax  dose.  I strongly encouraged him to follow-up with his primary care provider for additional discussion around management of his anxiety and panic attacks.  10.  CKD III:   creat stable at 1.42 in April.  Cont acei.  11.  Disposition: Follow-up echo.  Follow-up in 3 months or sooner if necessary.  Laneta Pintos, NP 02/09/2024, 9:53 AM

## 2024-02-09 NOTE — Patient Instructions (Signed)
 Medication Instructions:  Your physician recommends the following medication changes.  START TAKING: Carvedilol 3.125 mg 1 tablet 2 times daily  *If you need a refill on your cardiac medications before your next appointment, please call your pharmacy*  Testing/Procedures: Your physician has requested that you have an echocardiogram. Echocardiography is a painless test that uses sound waves to create images of your heart. It provides your doctor with information about the size and shape of your heart and how well your heart's chambers and valves are working.   You may receive an ultrasound enhancing agent through an IV if needed to better visualize your heart during the echo. This procedure takes approximately one hour.  There are no restrictions for this procedure.  This will take place at [address]) [address], [place] [address]  Please note: We ask at that you not bring children with you during ultrasound (echo/ vascular) testing. Due to room size and safety concerns, children are not allowed in the ultrasound rooms during exams. Our front office staff cannot provide observation of children in our lobby area while testing is being conducted. An adult accompanying a patient to their appointment will only be allowed in the ultrasound room at the discretion of the ultrasound technician under special circumstances. We apologize for any inconvenience.   Follow-Up: At [facility], you and your health needs are our priority.  As part of our continuing mission to provide you with exceptional heart care, our providers are all part of one team.  This team includes your primary Cardiologist (physician) and Advanced Practice Providers or APPs (Physician Assistants and Nurse Practitioners) who all work together to provide you with the care you need, when you need it.  Your next appointment:   3 month(s)  Provider:   You may see Antionette Kirks, MD or one of the  following Advanced Practice Providers on your designated Care Team:   Laneta Pintos, NP Gildardo Labrador, PA-C Varney Gentleman, PA-C Cadence Derby Line, PA-C Ronald Cockayne, NP Morey Ar, NP    We recommend signing up for the patient portal called "MyChart".  Sign up information is provided on this After Visit Summary.  MyChart is used to connect with patients for Virtual Visits (Telemedicine).  Patients are able to view lab/test results, encounter notes, upcoming appointments, etc.  Non-urgent messages can be sent to your provider as well.   To learn more about what you can do with MyChart, go to [email or web address].

## 2024-02-14 ENCOUNTER — Other Ambulatory Visit: Payer: Self-pay

## 2024-02-17 ENCOUNTER — Ambulatory Visit: Payer: Self-pay | Admitting: Nurse Practitioner

## 2024-02-17 ENCOUNTER — Ambulatory Visit: Attending: Nurse Practitioner

## 2024-02-17 DIAGNOSIS — R0609 Other forms of dyspnea: Secondary | ICD-10-CM | POA: Diagnosis not present

## 2024-02-17 LAB — ECHOCARDIOGRAM COMPLETE
Area-P 1/2: 1.38 cm2
Calc EF: 50.8 %
S' Lateral: 4.5 cm
Single Plane A2C EF: 52.3 %
Single Plane A4C EF: 51.3 %

## 2024-03-01 ENCOUNTER — Encounter: Payer: Self-pay | Admitting: Oncology

## 2024-03-01 DIAGNOSIS — Z8521 Personal history of malignant neoplasm of larynx: Secondary | ICD-10-CM | POA: Diagnosis not present

## 2024-03-01 DIAGNOSIS — H6522 Chronic serous otitis media, left ear: Secondary | ICD-10-CM | POA: Diagnosis not present

## 2024-03-09 ENCOUNTER — Encounter: Payer: Self-pay | Admitting: *Deleted

## 2024-03-09 ENCOUNTER — Telehealth: Payer: Self-pay | Admitting: Urology

## 2024-03-09 NOTE — Telephone Encounter (Signed)
 Patient called with questions regarding the upcoming biopsy scheduled for 03/13/24 with Dr. Cherylene Corrente. The patient is requesting a call back.

## 2024-03-09 NOTE — Telephone Encounter (Signed)
 Notified patient about biopsy instructions. Sent my chart also

## 2024-03-13 ENCOUNTER — Encounter: Payer: Self-pay | Admitting: Urology

## 2024-03-13 ENCOUNTER — Ambulatory Visit: Admitting: Urology

## 2024-03-13 VITALS — BP 162/72 | HR 75 | Ht 73.0 in | Wt 170.0 lb

## 2024-03-13 DIAGNOSIS — Z2989 Encounter for other specified prophylactic measures: Secondary | ICD-10-CM

## 2024-03-13 DIAGNOSIS — R972 Elevated prostate specific antigen [PSA]: Secondary | ICD-10-CM | POA: Diagnosis not present

## 2024-03-13 DIAGNOSIS — R948 Abnormal results of function studies of other organs and systems: Secondary | ICD-10-CM | POA: Diagnosis not present

## 2024-03-13 DIAGNOSIS — C61 Malignant neoplasm of prostate: Secondary | ICD-10-CM

## 2024-03-13 MED ORDER — GENTAMICIN SULFATE 40 MG/ML IJ SOLN
80.0000 mg | Freq: Once | INTRAMUSCULAR | Status: AC
Start: 2024-03-13 — End: 2024-03-13
  Administered 2024-03-13: 80 mg via INTRAMUSCULAR

## 2024-03-13 MED ORDER — LEVOFLOXACIN 500 MG PO TABS
500.0000 mg | ORAL_TABLET | Freq: Once | ORAL | Status: AC
  Administered 2024-03-13: 500 mg via ORAL

## 2024-03-13 NOTE — Progress Notes (Signed)
   Prostate Biopsy Procedure   Informed consent was obtained after discussing risks/benefits of the procedure.  A time out was performed to ensure correct patient identity.  Pre-Procedure: -Abnormal prominent activity left posterolateral PZ on FDG PET done for squamous cell carcinoma left vocal cord.  Rising PSA - Last PSA Level: 4.92 - Gentamicin given prophylactically - Levaquin 500 mg administered PO -Benign DRE -Transrectal Ultrasound performed revealing a 42 gm prostate -No significant hypoechoic or median lobe noted  Procedure: - Prostate block performed using 10 cc 1% lidocaine  and biopsies taken from sextant areas, a total of 12 under ultrasound guidance.  All of the left lateral cores included the sites of abnormal activity on CT/PET  Post-Procedure: - Patient tolerated the procedure well - He was counseled to seek immediate medical attention if experiences any severe pain, significant bleeding, or fevers - Return in one week to discuss biopsy results   Darlynn Elam, MD

## 2024-03-20 ENCOUNTER — Ambulatory Visit
Admission: RE | Admit: 2024-03-20 | Discharge: 2024-03-20 | Disposition: A | Discharge status: Home / Self Care | Source: Ambulatory Visit | Attending: Radiation Oncology | Admitting: Radiation Oncology

## 2024-03-20 DIAGNOSIS — R609 Edema, unspecified: Secondary | ICD-10-CM | POA: Diagnosis not present

## 2024-03-20 DIAGNOSIS — C320 Malignant neoplasm of glottis: Secondary | ICD-10-CM | POA: Insufficient documentation

## 2024-03-20 DIAGNOSIS — M47812 Spondylosis without myelopathy or radiculopathy, cervical region: Secondary | ICD-10-CM | POA: Diagnosis not present

## 2024-03-20 DIAGNOSIS — I6523 Occlusion and stenosis of bilateral carotid arteries: Secondary | ICD-10-CM | POA: Diagnosis not present

## 2024-03-20 LAB — POCT I-STAT CREATININE: Creatinine, Ser: 1.5 mg/dL — ABNORMAL HIGH (ref 0.61–1.24)

## 2024-03-20 MED ORDER — IOHEXOL 300 MG/ML  SOLN
75.0000 mL | Freq: Once | INTRAMUSCULAR | Status: AC | PRN
Start: 2024-03-20 — End: 2024-03-20
  Administered 2024-03-20: 60 mL via INTRAVENOUS

## 2024-03-20 MED ORDER — SODIUM CHLORIDE 0.9 % IV SOLN: INTRAVENOUS | Status: DC

## 2024-03-21 ENCOUNTER — Telehealth: Payer: Self-pay

## 2024-03-21 DIAGNOSIS — C61 Malignant neoplasm of prostate: Secondary | ICD-10-CM

## 2024-03-21 NOTE — Telephone Encounter (Signed)
 Pt calling requesting results of prostate biopsy, please advise.

## 2024-03-23 NOTE — Telephone Encounter (Signed)
 I contacted Bobby Sandoval to discuss his prostate biopsy report.  He had no postbiopsy complaints.  2/12 cores were positive for Gleason 4+4 (50%) and 3+4 (20%) at the LLM and LM sites respectively.  Recommend staging evaluation with PSMA/PET and order placed.  If no evidence of metastatic disease we discussed potential options including radiation and ADT

## 2024-03-23 NOTE — Addendum Note (Signed)
 Addended by: Geraline Knapp on: 03/23/2024 06:24 PM   Modules accepted: Orders

## 2024-03-27 ENCOUNTER — Encounter: Payer: Self-pay | Admitting: Radiation Oncology

## 2024-03-27 ENCOUNTER — Ambulatory Visit
Admission: RE | Admit: 2024-03-27 | Discharge: 2024-03-27 | Disposition: A | Discharge status: Home / Self Care | Source: Ambulatory Visit | Attending: Radiation Oncology | Admitting: Radiation Oncology

## 2024-03-27 VITALS — BP 158/64 | HR 62 | Temp 96.9°F | Resp 16 | Ht 73.0 in | Wt 170.2 lb

## 2024-03-27 DIAGNOSIS — C320 Malignant neoplasm of glottis: Secondary | ICD-10-CM | POA: Diagnosis not present

## 2024-03-27 DIAGNOSIS — C329 Malignant neoplasm of larynx, unspecified: Secondary | ICD-10-CM | POA: Diagnosis not present

## 2024-03-27 NOTE — Progress Notes (Signed)
 Radiation Oncology Follow up Note 45-month follow-up Name: Bobby Sandoval   Date:   03/27/2024 MRN:  [id number] DOB: 10/07/1945    This 78 y.o. 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  REFERRING PROVIDER: Rudolpho Norleen BIRCH, MD  HPI: Patient is a 78 year old male now at 4 months having completed radiation therapy to his larynx for stage III squamous cell carcinoma the right true cord with concurrent chemotherapy.  Seen today in routine follow-up he is not having significant dysphagia.  He is not having any head and neck pain.  He is recently diagnosed with prostate cancer by urology.  And PSMA PET scan has been ordered.  He had a recent CT scan of the head and neck.  Showing no discrete enhancing mass in the larynx there was diffuse edema noted.  No evidence of cervical adenopathy.  COMPLICATIONS OF TREATMENT: none  FOLLOW UP COMPLIANCE: keeps appointments   PHYSICAL EXAM:  BP (!) 158/64   Pulse 62   Temp (!) 96.9 F (36.1 C) (Tympanic)   Resp 16   Ht 6' 1 (1.854 m)   Wt 170 lb 3.2 oz (77.2 kg)   BMI 22.46 kg/m  Oral cavity is clear no oral mucosal lesions are identified.  Neck is clear without evidence of cervical adenopathy.  Well-developed well-nourished patient in NAD. HEENT reveals PERLA, EOMI, discs not visualized.  Oral cavity is clear. No oral mucosal lesions are identified. Neck is clear without evidence of cervical or supraclavicular adenopathy. Lungs are clear to A&P. Cardiac examination is essentially unremarkable with regular rate and rhythm without murmur rub or thrill. Abdomen is benign with no organomegaly or masses noted. Motor sensory and DTR levels are equal and symmetric in the upper and lower extremities. Cranial nerves II through XII are grossly intact. Proprioception is intact. No peripheral adenopathy or edema is identified. No motor or sensory levels  are noted. Crude visual fields are within normal range.  RADIOLOGY RESULTS: CT scan reviewed compatible with above-stated findings.  PLAN: Present time patient is doing well from a head and neck standpoint.  At requested he makes follow appointment on a monthly basis with ENT at this time.  He also has a PSMA PET scan which is good to be performed the next several weeks I will see him after the PSMA PET scan to possibly discuss radiation treatment for his prostate cancer.  Initial biopsy was a Gleason 8 (4+4).  I set him up for a 3-week follow-up to discuss his prostate findings.  Patient knows to call sooner with any concerns.  I would like to take this opportunity to thank you for allowing me to participate in the care of your patient.SABRA Marcey Penton, MD

## 2024-04-10 ENCOUNTER — Telehealth: Payer: Self-pay | Admitting: Urology

## 2024-04-10 NOTE — Telephone Encounter (Signed)
 Notified patient that blood can be in his sperm for 6 weeks after biopsy

## 2024-04-10 NOTE — Telephone Encounter (Signed)
 Wants to know how long he will see blood after his biopsy? He would like a call back at your earliest convenience.

## 2024-04-11 NOTE — Addendum Note (Signed)
 Addended by: GENITA HARLENE CROME on: 04/11/2024 08:22 AM   Modules accepted: Orders

## 2024-04-17 ENCOUNTER — Ambulatory Visit: Attending: Radiation Oncology | Admitting: Radiation Oncology

## 2024-04-17 ENCOUNTER — Ambulatory Visit
Admission: RE | Admit: 2024-04-17 | Discharge: 2024-04-17 | Disposition: A | Discharge status: Home / Self Care | Source: Ambulatory Visit | Attending: Urology | Admitting: Urology

## 2024-04-17 DIAGNOSIS — C61 Malignant neoplasm of prostate: Secondary | ICD-10-CM | POA: Insufficient documentation

## 2024-04-17 DIAGNOSIS — I288 Other diseases of pulmonary vessels: Secondary | ICD-10-CM | POA: Diagnosis not present

## 2024-04-17 DIAGNOSIS — I700 Atherosclerosis of aorta: Secondary | ICD-10-CM | POA: Diagnosis not present

## 2024-04-17 DIAGNOSIS — Z923 Personal history of irradiation: Secondary | ICD-10-CM | POA: Insufficient documentation

## 2024-04-17 DIAGNOSIS — Z8521 Personal history of malignant neoplasm of larynx: Secondary | ICD-10-CM | POA: Insufficient documentation

## 2024-04-17 DIAGNOSIS — I2510 Atherosclerotic heart disease of native coronary artery without angina pectoris: Secondary | ICD-10-CM | POA: Insufficient documentation

## 2024-04-17 DIAGNOSIS — N200 Calculus of kidney: Secondary | ICD-10-CM | POA: Diagnosis not present

## 2024-04-17 DIAGNOSIS — J439 Emphysema, unspecified: Secondary | ICD-10-CM | POA: Insufficient documentation

## 2024-04-17 DIAGNOSIS — I7789 Other specified disorders of arteries and arterioles: Secondary | ICD-10-CM | POA: Diagnosis not present

## 2024-04-17 DIAGNOSIS — K8020 Calculus of gallbladder without cholecystitis without obstruction: Secondary | ICD-10-CM | POA: Insufficient documentation

## 2024-04-17 DIAGNOSIS — C760 Malignant neoplasm of head, face and neck: Secondary | ICD-10-CM | POA: Diagnosis not present

## 2024-04-17 DIAGNOSIS — Z9221 Personal history of antineoplastic chemotherapy: Secondary | ICD-10-CM | POA: Insufficient documentation

## 2024-04-17 MED ORDER — FLOTUFOLASTAT F 18 GALLIUM 296-5846 MBQ/ML IV SOLN
8.0000 | Freq: Once | INTRAVENOUS | Status: AC
  Administered 2024-04-17: 8.68 via INTRAVENOUS
  Filled 2024-04-17: qty 8

## 2024-04-22 ENCOUNTER — Ambulatory Visit: Payer: Self-pay | Admitting: Urology

## 2024-04-26 ENCOUNTER — Ambulatory Visit: Admitting: Radiation Oncology

## 2024-05-01 ENCOUNTER — Ambulatory Visit: Admitting: Radiation Oncology

## 2024-05-03 ENCOUNTER — Encounter: Payer: Self-pay | Admitting: Radiation Oncology

## 2024-05-03 ENCOUNTER — Ambulatory Visit
Admission: RE | Admit: 2024-05-03 | Discharge: 2024-05-03 | Disposition: A | Discharge status: Home / Self Care | Source: Ambulatory Visit | Attending: Radiation Oncology | Admitting: Radiation Oncology

## 2024-05-03 VITALS — BP 172/76 | HR 60 | Temp 97.0°F | Resp 15 | Ht 73.0 in | Wt 170.7 lb

## 2024-05-03 DIAGNOSIS — C61 Malignant neoplasm of prostate: Secondary | ICD-10-CM | POA: Diagnosis not present

## 2024-05-03 DIAGNOSIS — C320 Malignant neoplasm of glottis: Secondary | ICD-10-CM | POA: Diagnosis not present

## 2024-05-03 NOTE — Progress Notes (Signed)
 Radiation Oncology Follow up Note old patient new area prostate cancer  Name: Bobby Sandoval   Date:   05/03/2024 MRN:  [id number] DOB: [birth year]    This 78 y.o. 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  He has done well with that and completed treatment 6 months ago has no evidence of disease.  He had a CT scan back in June of his head and neck showing no enhancing mass lesion in the larynx does have some diffuse edematous posttreatment changes.  No evidence of cervical adenopathy.  He is having no head and neck pain and no dysphagia at this time.  He has been noted to have.  An elevated PSA in the 4.9 range.  He underwent transrectal ultrasound-guided biopsy back in June showing 2 cores positive for adenocarcinoma 1 a Gleason 8 (4+4) the other Gleason 73+4).  He underwent a PSMA PET scan showing a dominant primary tumor within his prostate in the posterior lateral left base to mid gland.  There is also small focus of tracer avidity within the anterior paramidline left apex suspicious for second sided primary.  No evidence of soft tissue metastasis.  There are a few areas of increased uptake in his sixth rib and T7 unlikely to represent metastatic disease according to radiology.  He has very low complaints as far as urinary frequency urgency nocturia is concerned.  He is having no bone pain.  He is now referred to radiation oncology for consideration of treatment.  COMPLICATIONS OF TREATMENT: none  FOLLOW UP COMPLIANCE: keeps appointments   PHYSICAL EXAM:  BP (!) 172/76   Pulse 60   Temp (!) 97 F (36.1 C)   Resp 15   Ht 6' 1 (1.854 m)   Wt 170 lb 11.2 oz  (77.4 kg)   BMI 22.52 kg/m  Well-developed well-nourished patient in NAD. HEENT reveals PERLA, EOMI, discs not visualized.  Oral cavity is clear. No oral mucosal lesions are identified. Neck is clear without evidence of cervical or supraclavicular adenopathy. Lungs are clear to A&P. Cardiac examination is essentially unremarkable with regular rate and rhythm without murmur rub or thrill. Abdomen is benign with no organomegaly or masses noted. Motor sensory and DTR levels are equal and symmetric in the upper and lower extremities. Cranial nerves II through XII are grossly intact. Proprioception is intact. No peripheral adenopathy or edema is identified. No motor or sensory levels are noted. Crude visual fields are within normal range.  RADIOLOGY RESULTS: PSMA PET scan reviewed compatible with above-stated findings  PLAN: At this time based on the high Gleason score of 8 would recommend treatment at this time.  Would plan on image guided IMRT radiation therapy to his prostate.  Will treat the area of PSMA affinity localized in his prostate to 38 Gray over 40 fractions treating the remainder of the gland to 80 Gray in 40 fractions.  Risks and benefits of treatment including increased lower Neri tract symptoms diarrhea fatigue alteration blood counts and skin reaction were discussed with the patient.  I have asked urology to place fiducial markers for daily image guided treatment as well as start the patient on one 55-month Eligard depot.  Patient  comprehends my recommendations well we will set up simulation once we know markers have been placed.  Patient comprehends recommendations well.  I would like to take this opportunity to thank you for allowing me to participate in the care of your patient.SABRA Marcey Penton, MD

## 2024-05-04 ENCOUNTER — Telehealth: Payer: Self-pay

## 2024-05-04 NOTE — Telephone Encounter (Signed)
 Patient called and was asking for advice about his frequent urination at night. He wants to know if his chemo is causing it or his prostate possibly. He states he will be getting prostate markers placed soon. I suggested him setting up an appointment to talk to someone about the frequent urination but he declined at this time to schedule an appointment.

## 2024-05-05 ENCOUNTER — Telehealth: Payer: Self-pay

## 2024-05-05 ENCOUNTER — Telehealth: Payer: Self-pay | Admitting: Cardiovascular Disease

## 2024-05-05 DIAGNOSIS — L0390 Cellulitis, unspecified: Secondary | ICD-10-CM

## 2024-05-05 HISTORY — DX: Cellulitis, unspecified: L03.90

## 2024-05-05 NOTE — Telephone Encounter (Signed)
 [facility] [place] Phone: [phone] Fax: [phone] Cardiac Clearance  MRN: [id number]  Patient Name: Bobby Sandoval DOB: 1946/02/16 Procedure:Eligard and Viviane seed Markers Date of Procedure:05/26/24 Cardiologist/PCP: Dr. Darron Phone/Fax: [phone] Reason for Clearance:____ASA 81 mg  stop 7 days prior_____________  *Please complete note and fax back to our office. *  Clearance Note:  _____________________________________________________________________________________  Please sign:  Dr:__________________________________________________________________

## 2024-05-05 NOTE — Telephone Encounter (Signed)
   Name: Bobby Sandoval  DOB: 10-Apr-1946  MRN: [id number]  Primary Cardiologist: Deatrice Cage, MD  Chart reviewed as part of pre-operative protocol coverage. Because of Bobby Sandoval's past medical history and time since last visit, Bobby Sandoval will require a follow-up in-office visit in order to better assess preoperative cardiovascular risk.  Pre-op covering staff: - Please schedule appointment and call patient to inform them. If patient already had an upcoming appointment within acceptable timeframe, please add pre-op clearance to the appointment notes so provider is aware. - Please contact requesting surgeon's office via preferred method (i.e, phone, fax) to inform them of need for appointment prior to surgery.  Patient can hold aspirin  for 5 to 7 days prior to procedure and restart when medically safe to do so as long as asymptomatic at the time of office visit.  Orren LOISE Fabry, PA-C  05/05/2024, 1:49 PM

## 2024-05-05 NOTE — Telephone Encounter (Signed)
   Pre-operative Risk Assessment    Patient Name: Bobby Sandoval  DOB: [birth year] MRN: [id number]   Date of last office visit: unknown Date of next office visit: 05/11/24   Request for Surgical Clearance    Procedure:  eligard and gold seed markers  Date of Surgery:  Clearance 05/06/24                                Surgeon:  unknown Surgeon's Group or Practice Name:  [facility] [place] Phone number:  [phone] Fax number:  [phone]   Type of Clearance Requested:   - Pharmacy:  Hold ASA 81 mg stop 7 days prior      Type of Anesthesia:  Not Indicated   Additional requests/questions:    Bonney Berwyn LELON Jackson   05/05/2024, 1:31 PM

## 2024-05-05 NOTE — Telephone Encounter (Signed)
 LMTRC  Pt needs Eligard and Markers placed.   Blood thinners???

## 2024-05-05 NOTE — Telephone Encounter (Signed)
 Will update appt note so that preop clearance can be addressed at the IN OFFICE appt 05/11/24 Lonni Meager, NP

## 2024-05-09 ENCOUNTER — Telehealth: Payer: Self-pay

## 2024-05-09 NOTE — Telephone Encounter (Signed)
 Spoke with patient.  He had multiple questions regarding his upcoming appointments.  Questions answered.

## 2024-05-10 NOTE — Telephone Encounter (Signed)
 Wife is calling back. They had to cx appt for 8/7 because he had another urgent appt. Wife wants to talk to someone to understand why he has to come in for an appt to be cleared.

## 2024-05-10 NOTE — Telephone Encounter (Signed)
 Spoke with patient's wife (DPR on file) and explain to her why pt needs to come for pre-op clearance per protocol. She has rescheduled patient for 8/8 at 8 am with Lonni Meager, NP. Patient's wife thanked me for the returned call

## 2024-05-11 ENCOUNTER — Encounter: Payer: Self-pay | Admitting: Oncology

## 2024-05-11 ENCOUNTER — Ambulatory Visit: Admitting: Nurse Practitioner

## 2024-05-11 DIAGNOSIS — J040 Acute laryngitis: Secondary | ICD-10-CM | POA: Diagnosis not present

## 2024-05-11 DIAGNOSIS — R1310 Dysphagia, unspecified: Secondary | ICD-10-CM | POA: Diagnosis not present

## 2024-05-11 NOTE — Progress Notes (Unsigned)
 Cardiology Office Note    Date:  05/12/2024   ID:  Bobby, Sandoval 03-20-1946, MRN [id number]  PCP:  Rudolpho Norleen BIRCH, MD  Cardiologist:  Deatrice Cage, MD  Electrophysiologist:  None   Chief Complaint: Follow up  History of Present Illness:   Bobby Sandoval is a 78 y.o. male with history of CAD s/p prior RCA and LAD stenting, chronic HFpEF, alcohol  abuse, COPD, hypertension, hyperlipidemia, tobacco abuse, obesity, AAA s/p endograft, and laryngeal cancer presents for follow up on echo results and preoperative risk assessment.    Patient previously had inferior MI and underwent bare-metal stenting of the RCA x 2 in 2007.  He subsequently required PCI and DES placement to the mid LAD in 2008.  Diagnostic catheterization in 07/2015 showed an occluded mid LAD, proximal to the previously placed stent, with diffuse disease and right to left collaterals.  RCA stents were patent.  He was medically managed.  Echo 01/2018 showed normal LV function with G1 DD and without any significant valvular abnormalities.  In September 2019, he underwent endovascular repair of abdominal aortic aneurysm with stent graft.  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  He refused surgical intervention and completed cisplatin  and XRT.  PET in 01/2024 without evidence of laryngeal malignancy but he continues to have hypermetabolic prostate lesion with upward trending PSA and recommendation for prostate biopsy.    Patient was most recently seen in our office 02/09/2024 during which time he was overall doing well from a cardiac perspective.  He was not consistently taking aspirin .  He noted some dyspnea on exertion over the past few months, which was out of character for him.  He was started on low-dose carvedilol  in the setting of hypertension.  Echocardiogram was ordered and completed 02/17/2024 and showed normal LV systolic function with EF 55 to  60%, G1 DD, severely left atrium MR.  Patient presents to clinic today overall doing well from a cardiac perspective. He is without symptoms of angina and cardiac decompensation. He reports improvements in DOE, although he has been less active than prior. He is scheduled for Eligard and placement of gold seed markers on 8/22. He reports difficulty swallowing for the past few days for which he saw ENT urgently yesterday and was started on prednisone. For this reason, he was not able to take his medications this morning. He reports home BP readings have been elevated with systolic pressures ranging from 140s-160s mmHg. He denies chest pain, shortness of breath, lightheadedness, dizziness, and lower extremity swelling. Denies bleeding and hematochezia.   Labs independently reviewed: 02/02/2024-Hgb 10.2, HCT 30.5, platelets 131, sodium 134, potassium 4.7, BUN 30, creatinine 1.42, magnesium  1.9  Objective   Past Medical History:  Diagnosis Date   AAA (abdominal aortic aneurysm) (HCC)    a. 2012 3.4 cm; b. 07/2017 Abd u/s: Dist abd Ao dil up to 4.8cm; c. 03/2018 CT Abd: infrarenal AAA 5.3 x 5.7cm; d. 06/2018 EVAR (28mm prox/72mm dist, 18cm length Gore Excluder Endoprosthesis).   Aortic valve sclerosis    Arthritis    CAD (coronary artery disease)    a. 2007 Inf MI w/ BMS to RCA x 2; b. 2008 PCI/DES to mid LAD @ [facility]; c. 07/2015 Cath: LAD 100 prox to prev placed stent w/ R->L collats, RCA stents patent. EF 50-55%. Med Rx; d. 04/2018 MV: EF 48%, mid-dist ant/apical/apical-lat ischemia->med rx.   Cataracts, bilateral    Chronic  heart failure with preserved ejection fraction (HFpEF) (HCC)    a. 02/2014 Echo: EF 55-65%. Gr1 DD; b. 01/2018 Echo: EF 60-65%, Gr1 DD, mild MR, mildly dil LA. nl RV fxn. Nl PASP; c. 02/2024 Echo: EF 55-60%, no rwma, GrI DD, nl RV fxn, sev dil LA, mild-mod MR. AoV sclerosis.   COPD (chronic obstructive pulmonary disease) (HCC)    ETOH abuse    Grade I diastolic dysfunction    Hepatic  steatosis    a. 03/2018 CT Abd: hepatic steatosis and possible early cirrhotic morphology of liver.   History of heart attack    History of kidney stones    Hyperlipidemia    Hypertension    MI (myocardial infarction) (HCC)    Mild mitral regurgitation by prior echocardiogram    Obesity    Panic disorder    Panic disorder    Status post AAA (abdominal aortic aneurysm) repair    Tobacco abuse     Current Medications: Current Meds  Medication Sig   ALPRAZolam  (XANAX ) 0.5 MG tablet Take 1 tablet (0.5 mg total) by mouth 3 (three) times daily as needed for anxiety.   aspirin  EC 81 MG tablet Take 1 tablet (81 mg total) by mouth daily.   atorvastatin  (LIPITOR) 40 MG tablet Take 1 tablet (40 mg total) by mouth daily.   carvedilol  (COREG ) 3.125 MG tablet Take 1 tablet (3.125 mg total) by mouth 2 (two) times daily with a meal.   cefdinir (OMNICEF) 300 MG capsule Take 300 mg by mouth 2 (two) times daily.   enalapril  (VASOTEC ) 20 MG tablet Take 1 tablet (20 mg total) by mouth 2 (two) times daily.   ketoconazole (NIZORAL) 2 % shampoo Apply 1 Application topically daily.   magic mouthwash (multi-ingredient) oral suspension Swish and swallow 5-10 mLs 4 (four) times daily as needed.   Multiple Vitamins-Minerals (PRESERVISION AREDS 2) CAPS Take 1 tablet by mouth 2 (two) times daily.   nitroGLYCERIN  (NITROSTAT ) 0.4 MG SL tablet Place 1 tablet (0.4 mg total) under the tongue every 5 (five) minutes as needed for chest pain.   ondansetron  (ZOFRAN ) 8 MG tablet Take 1 tablet (8 mg total) by mouth every 8 (eight) hours as needed for nausea or vomiting. Start on the third day after cisplatin .   predniSONE (STERAPRED UNI-PAK 48 TAB) 10 MG (48) TBPK tablet Take by mouth 2 (two) times daily.   prochlorperazine  (COMPAZINE ) 10 MG tablet Take 1 tablet (10 mg total) by mouth every 6 (six) hours as needed (Nausea or vomiting).   sucralfate  (CARAFATE ) 1 g tablet TAKE ONE TABLET (1 GRAM TOTAL) BY MOUTH FOUR TIMES DAILY.    torsemide  (DEMADEX ) 20 MG tablet Take 20 mg by mouth daily as needed.   VENTOLIN  HFA 108 (90 Base) MCG/ACT inhaler INHALE TWO PUFFS INTO THE LUNGS EVERY FOUR HOURS AS NEEDED FOR WHEEZING OR SHORTNESS OF BREATH (COUGH, SHORTNESS OF BREATH OR WHEEZING.).    Allergies:   Patient has no known allergies.   Social History   Socioeconomic History   Marital status: Married    Spouse name: Not on file   Number of children: Not on file   Years of education: Not on file   Highest education level: Not on file  Occupational History   Not on file  Tobacco Use   Smoking status: Former    Current packs/day: 0.00    Average packs/day: 2.0 packs/day for 50.0 years (100.0 ttl pk-yrs)    Types: Cigarettes    Quit date:  07/20/2023    Years since quitting: 0.8   Smokeless tobacco: Never  Vaping Use   Vaping status: Never Used  Substance and Sexual Activity   Alcohol  use: Yes    Alcohol /week: 84.0 standard drinks of alcohol     Types: 84 Cans of beer per week    Comment: daily   Drug use: No   Sexual activity: Not Currently  Other Topics Concern   Not on file  Social History Narrative   Not on file   Social Drivers of Health   Financial Resource Strain: Low Risk  (07/26/2023)   Overall Financial Resource Strain (CARDIA)    Difficulty of Paying Living Expenses: Not hard at all  Food Insecurity: No Food Insecurity (07/26/2023)   Hunger Vital Sign    Worried About Running Out of Food in the Last Year: Never true    Ran Out of Food in the Last Year: Never true  Transportation Needs: No Transportation Needs (07/26/2023)   PRAPARE - Administrator, Civil Service (Medical): No    Lack of Transportation (Non-Medical): No  Physical Activity: Not on file  Stress: Not on file  Social Connections: Not on file     Family History:  The patient's family history includes Heart disease in his father; Hypertension in his mother.  ROS:   12-point review of systems is negative unless  otherwise noted in the HPI.  EKGs/Other Studies Reviewed:    Studies reviewed were summarized above. The additional studies were reviewed today: 02/17/2024 Echo complete 1. Left ventricular ejection fraction, by estimation, is 55 to 60%. The  left ventricle has normal function. The left ventricle has no regional  wall motion abnormalities. Left ventricular diastolic parameters are  consistent with Grade I diastolic  dysfunction (impaired relaxation).   2. Right ventricular systolic function is normal. The right ventricular  size is normal. Tricuspid regurgitation signal is inadequate for assessing  PA pressure.   3. Left atrial size was severely dilated.   4. The mitral valve is normal in structure. Mild to moderate mitral valve  regurgitation. No evidence of mitral stenosis.   5. The aortic valve is normal in structure. There is mild calcification  of the aortic valve. Aortic valve regurgitation is not visualized. Aortic  valve sclerosis is present, with no evidence of aortic valve stenosis.   6. The inferior vena cava is normal in size with greater than 50%  respiratory variability, suggesting right atrial pressure of 3 mmHg.   EKG:  EKG personally reviewed by me today EKG Interpretation Date/Time:  Friday May 12 2024 08:05:36 EDT Ventricular Rate:  58 PR Interval:  208 QRS Duration:  102 QT Interval:  420 QTC Calculation: 412 R Axis:   -1  Text Interpretation: Sinus bradycardia with Premature atrial complexes Cannot rule out Anterior infarct (cited on or before 09-Feb-2024) When compared with ECG of 09-Feb-2024 09:03, No significant change was found Confirmed by Lorene Sinclair ([id number]) on 05/12/2024 8:09:18 AM  PHYSICAL EXAM:    VS:  BP (!) 172/70   Pulse (!) 58   Ht 6' 1 (1.854 m)   Wt 169 lb 9.6 oz (76.9 kg)   SpO2 96%   BMI 22.38 kg/m   BMI: Body mass index is 22.38 kg/m.  Physical Exam Vitals and nursing note reviewed.  Constitutional:      Appearance: Normal  appearance.  Cardiovascular:     Rate and Rhythm: Normal rate and regular rhythm.     Heart sounds: No  murmur heard.    No gallop.  Pulmonary:     Effort: Pulmonary effort is normal.     Breath sounds: Normal breath sounds. No wheezing or rales.  Musculoskeletal:     Right lower leg: No edema.     Left lower leg: No edema.  Skin:    General: Skin is warm and dry.  Neurological:     General: No focal deficit present.     Mental Status: He is alert and oriented to person, place, and time. Mental status is at baseline.  Psychiatric:        Mood and Affect: Mood normal.        Behavior: Behavior normal.     Wt Readings from Last 3 Encounters:  05/12/24 169 lb 9.6 oz (76.9 kg)  05/03/24 170 lb 11.2 oz (77.4 kg)  03/27/24 170 lb 3.2 oz (77.2 kg)     ASSESSMENT & PLAN:   Coronary artery disease - S/p prior inferior MI with RCA stent in 2007 and LAD stent in 2008, findings of occluded LAD in 07/2015.  No symptoms of angina or cardiac decompensation.  Echo completed 02/2024 showed preserved LV function.  He is continued on aspirin , atorvastatin , and carvedilol .  Chronic HFpEF Dyspnea on exertion - At prior visit 02/2024 patient reported worsening dyspnea on exertion over the past several months.  Echocardiogram completed 02/17/2024 revealed LVEF 55 to 60% with G1 DD.  Today, patient reports improvement in symptoms, although he has been less active than prior.  He is continued on carvedilol , enalapril , and as needed torsemide .  Preoperative risk assessment Laryngeal cancer/hypermetabolic prostate lesion - Patient is scheduled for Eligard and gold seed markers on 8/22.  He is without symptoms of angina or cardiac decompensation.  Recent echo with preserved LV systolic function.  He may proceed without further ischemic testing.  If necessary, aspirin  can be held for 7 days prior to procedure.  He should continue beta-blocker and statin therapy throughout the perioperative period.  Primary  hypertension - Blood pressure elevated today and per at home readings.  However, he did not take his blood pressure medications this morning due to difficulty swallowing.  Continue enalapril  and carvedilol .  Consider addition of hydralazine  or Imdur at next visit if BP remains elevated.  Hyperlipidemia - Most recent lipid panel with LDL 134 on 06/2023.  At that time he was not taking atorvastatin .  Plan for repeat lipids with his PCP at annual physical.  Tobacco abuse - Quit in 07/2023  Abdominal aortic aneurysm - S/p endovascular repair. Followed by vascular surgery.   CKD III - Cr 1.5, appears near baseline. Continue ACEi.    Disposition: F/u with Dr. Darron or an APP in 3 months.   Medication Adjustments/Labs and Tests Ordered: Current medicines are reviewed at length with the patient today.  Concerns regarding medicines are outlined above. Medication changes, Labs and Tests ordered today are summarized above and listed in the Patient Instructions accessible in Encounters.   Bonney Lesley Maffucci, PA-C 05/12/2024 8:41 AM     [facility] [place] [address] [place], [place] [address] [phone]

## 2024-05-12 ENCOUNTER — Ambulatory Visit: Attending: Nurse Practitioner | Admitting: Physician Assistant

## 2024-05-12 ENCOUNTER — Other Ambulatory Visit: Payer: Self-pay | Admitting: Unknown Physician Specialty

## 2024-05-12 ENCOUNTER — Encounter: Payer: Self-pay | Admitting: Nurse Practitioner

## 2024-05-12 VITALS — BP 172/70 | HR 58 | Ht 73.0 in | Wt 169.6 lb

## 2024-05-12 DIAGNOSIS — R0609 Other forms of dyspnea: Secondary | ICD-10-CM | POA: Diagnosis not present

## 2024-05-12 DIAGNOSIS — I5032 Chronic diastolic (congestive) heart failure: Secondary | ICD-10-CM

## 2024-05-12 DIAGNOSIS — R1310 Dysphagia, unspecified: Secondary | ICD-10-CM

## 2024-05-12 DIAGNOSIS — I714 Abdominal aortic aneurysm, without rupture, unspecified: Secondary | ICD-10-CM

## 2024-05-12 DIAGNOSIS — Z01818 Encounter for other preprocedural examination: Secondary | ICD-10-CM

## 2024-05-12 DIAGNOSIS — E785 Hyperlipidemia, unspecified: Secondary | ICD-10-CM

## 2024-05-12 DIAGNOSIS — I2510 Atherosclerotic heart disease of native coronary artery without angina pectoris: Secondary | ICD-10-CM

## 2024-05-12 DIAGNOSIS — Z720 Tobacco use: Secondary | ICD-10-CM

## 2024-05-12 DIAGNOSIS — Z79899 Other long term (current) drug therapy: Secondary | ICD-10-CM

## 2024-05-12 DIAGNOSIS — N183 Chronic kidney disease, stage 3 unspecified: Secondary | ICD-10-CM

## 2024-05-12 DIAGNOSIS — I10 Essential (primary) hypertension: Secondary | ICD-10-CM

## 2024-05-12 NOTE — Patient Instructions (Addendum)
 Medication Instructions:   Your physician recommends that you continue on your current medications as directed. Please refer to the Current Medication list given to you today.   *If you need a refill on your cardiac medications before your next appointment, please call your pharmacy*  Lab Work:  No labs ordered today   If you have labs (blood work) drawn today and your tests are completely normal, you will receive your results only by: MyChart Message (if you have MyChart) OR A paper copy in the mail If you have any lab test that is abnormal or we need to change your treatment, we will call you to review the results.  Testing/Procedures:  No test ordered today   Follow-Up: At [facility], you and your health needs are our priority.  As part of our continuing mission to provide you with exceptional heart care, our providers are all part of one team.  This team includes your primary Cardiologist (physician) and Advanced Practice Providers or APPs (Physician Assistants and Nurse Practitioners) who all work together to provide you with the care you need, when you need it.  Your next appointment:    3 month(s)  Provider:    You may see Deatrice Cage, MD or one of the following Advanced Practice Providers on your designated Care Team:   Lonni Meager, NP Lesley Maffucci, PA-C  We recommend signing up for the patient portal called MyChart.  Sign up information is provided on this After Visit Summary.  MyChart is used to connect with patients for Virtual Visits (Telemedicine).  Patients are able to view lab/test results, encounter notes, upcoming appointments, etc.  Non-urgent messages can be sent to your provider as well.   To learn more about what you can do with MyChart, go to [email or web address].

## 2024-05-13 ENCOUNTER — Other Ambulatory Visit: Payer: Self-pay

## 2024-05-18 ENCOUNTER — Ambulatory Visit
Admission: RE | Admit: 2024-05-18 | Discharge: 2024-05-18 | Disposition: A | Discharge status: Home / Self Care | Source: Ambulatory Visit | Attending: Unknown Physician Specialty | Admitting: Unknown Physician Specialty

## 2024-05-18 DIAGNOSIS — R1310 Dysphagia, unspecified: Secondary | ICD-10-CM | POA: Insufficient documentation

## 2024-05-18 NOTE — Procedures (Signed)
 Modified Barium Swallow Study  Patient Details  Name: Bobby Sandoval MRN: [id number] Date of Birth: 03/11/1946  Today's Date: 05/18/2024  Modified Barium Swallow completed.  Full report located under Chart Review in the Imaging Section.  History of Present Illness Patient is a 78 year old male who was referred for a Modified Barium Swallow Study by his ENT, Bobby Sandoval, d/t trouble swallowing. Per ENt note dated 05/11/2024 laryngoscopy revealed significant soft tissue swelling bilateral arytenoids, cords could not be fully visualized. Pt started on Prednisone dose pack as well as Cefdinir (300 mg). 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 Patient completed weekly cisplatin  on October 20, 2023.  XRT completed 1 week later on October 27, 2023.     CT soft tissue neck on 03/20/2024  No discrete enhancing mass lesion in the larynx. Diffuse edematous post  treatment changes in the vocal cords bilaterally and larynx.   Clinical Impression Pt reports improvement in swallow function since seeing ENT - I have been taking that steriod for 7 days now.   Pt benefited from frequent redirection to task d/t continued off-topic inappropriate comments to staff. As a result of this behavior and suspected poor health literacy, uncertain if pt was able to retain information.   Pt presents with adequate oropharyngeal abilities when consuming thin liquids, nectar thick liquids, puree, graham cracker with barium paste as well as whole barium tablet with thin liquids. At present time, pt's risk of aspiration appears reduced when consuming a regular diet with thin liquids, medicine whole with thin liquids. Education was provided to pt and his wife on the results of this study as well as late effects of radiation on swallow function. Continue to recommend follow up with Outpatient ST services for education and prevention of future  dysphagia related to radiation. Pt continues to decline services.  Factors that may increase risk of adverse event in presence of aspiration Bobby Sandoval & Bobby Sandoval 2021):  (history of radiation)  Swallow Evaluation Recommendations Recommendations: PO diet PO Diet Recommendation: Regular;Dysphagia 3 (Mechanical soft);Thin liquids (Level 0) Liquid Administration via: Cup Medication Administration: Whole meds with liquid Supervision: Patient able to self-feed Swallowing strategies  : Minimize environmental distractions;Slow rate;Small bites/sips Postural changes: Position pt fully upright for meals;Stay upright 30-60 min after meals Oral care recommendations: Oral care BID (2x/day)    Bobby Sandoval B. Rubbie, M.S., CCC-SLP, Tree surgeon Certified Brain Injury Specialist [facility]  [facility] Office [phone] Ascom [phone] Fax [phone]

## 2024-05-22 ENCOUNTER — Telehealth: Payer: Self-pay

## 2024-05-22 NOTE — Telephone Encounter (Signed)
 Auth Submission: NO AUTH NEEDED Site of care: Urology Payer: [employer] medicare Medication & CPT/J Code(s) submitted: Eligard  [id number] Diagnosis Code:  Route of submission (phone, fax, portal): phone Phone # [phone] Fax # Auth type: Buy/Bill PB Units/visits requested: 45mg  every 6 months Reference number: [id number] Approval from: 05/22/24 to 10/04/24

## 2024-05-26 ENCOUNTER — Encounter: Payer: Self-pay | Admitting: Urology

## 2024-05-26 ENCOUNTER — Ambulatory Visit: Admitting: Urology

## 2024-05-26 VITALS — BP 122/54 | HR 65 | Wt 180.0 lb

## 2024-05-26 DIAGNOSIS — Z2989 Encounter for other specified prophylactic measures: Secondary | ICD-10-CM

## 2024-05-26 DIAGNOSIS — C61 Malignant neoplasm of prostate: Secondary | ICD-10-CM

## 2024-05-26 MED ORDER — LEVOFLOXACIN 500 MG PO TABS
500.0000 mg | ORAL_TABLET | Freq: Once | ORAL | Status: AC
  Administered 2024-05-26: 500 mg via ORAL

## 2024-05-26 MED ORDER — LEUPROLIDE ACETATE (6 MONTH) 45 MG ~~LOC~~ KIT
45.0000 mg | PACK | Freq: Once | SUBCUTANEOUS | Status: AC
Start: 2024-05-26 — End: 2024-05-26
  Administered 2024-05-26: 45 mg via SUBCUTANEOUS

## 2024-05-26 MED ORDER — GENTAMICIN SULFATE 40 MG/ML IJ SOLN
80.0000 mg | Freq: Once | INTRAMUSCULAR | Status: AC
  Administered 2024-05-26: 80 mg via INTRAMUSCULAR

## 2024-05-26 NOTE — Progress Notes (Signed)
 Eligard  SubQ Injection   Due to Prostate Cancer patient is present today for a Eligard  Injection.  Medication: Eligard  6 month Dose: 45 mg  Location: left  Lot: [id number] Exp: 06/2025  Patient tolerated well, no complications were noted  Performed by: Harlene Franks  Per Dr. Twylla patient is to continue therapy for onces . This appointment was scheduled using wheel and given to patient today along with reminder continue on Vitamin D 800-1000iu and Calcium  1000-1200mg  daily while on Androgen Deprivation Therapy.  PA approval dates:  05/22/24 to 10/04/24

## 2024-05-26 NOTE — Progress Notes (Signed)
   05/26/24  CC: gold fiducial marker placement  HPI: 78 y.o. male with prostate cancer who presents today for placement of fiducial markers in anticipation of his upcoming IMRT with Dr. Lenn.  Prostate Gold fiducial Marker Placement Procedure   Informed consent was obtained after discussing risks/benefits of the procedure.  A time out was performed to ensure correct patient identity.  Pre-Procedure: - Gentamicin  given prophylactically - PO Levaquin  500 mg also given today  Procedure: - Rectal ultrasound probe was placed without difficulty and the prostate visualized - Prostatic block performed with 10 mL 1% Xylocaine  - 3 fiducial gold seed markers placed, one at right base, one at left base, one at apex of prostate gland under transrectal ultrasound guidance  Post-Procedure: - Patient tolerated the procedure well - He was counseled to seek immediate medical attention if experiences any severe pain, significant bleeding, or fevers - Radiation oncology recommended ADT x 6 months and patient received a leuprolide  injection today.  We discussed the most common side effects of hot flashes, tiredness, fatigue, decreased libido, osteoporosis    Glendia Barba, MD

## 2024-05-30 ENCOUNTER — Ambulatory Visit
Admission: RE | Admit: 2024-05-30 | Discharge: 2024-05-30 | Disposition: A | Discharge status: Home / Self Care | Source: Ambulatory Visit | Attending: Radiation Oncology | Admitting: Radiation Oncology

## 2024-05-30 ENCOUNTER — Encounter: Payer: Self-pay | Admitting: Hospice and Palliative Medicine

## 2024-05-30 ENCOUNTER — Other Ambulatory Visit: Payer: Self-pay

## 2024-05-30 ENCOUNTER — Inpatient Hospital Stay: Attending: Hospice and Palliative Medicine | Admitting: Hospice and Palliative Medicine

## 2024-05-30 VITALS — BP 199/70 | HR 54 | Temp 97.8°F | Resp 20 | Ht 73.0 in | Wt 174.0 lb

## 2024-05-30 DIAGNOSIS — C61 Malignant neoplasm of prostate: Secondary | ICD-10-CM | POA: Insufficient documentation

## 2024-05-30 DIAGNOSIS — C320 Malignant neoplasm of glottis: Secondary | ICD-10-CM | POA: Diagnosis not present

## 2024-05-30 DIAGNOSIS — R600 Localized edema: Secondary | ICD-10-CM | POA: Insufficient documentation

## 2024-05-30 MED ORDER — CEPHALEXIN 500 MG PO CAPS: 500.0000 mg | ORAL_CAPSULE | Freq: Three times a day (TID) | ORAL | 0 refills | Status: DC

## 2024-05-30 NOTE — Patient Instructions (Signed)
 Appointment with Dr. Norleen Rower [facility] tomorrow Wednesday - 05/31/2024 at 330pm. Please arrive with your medication list, insurance cards.

## 2024-05-30 NOTE — Progress Notes (Signed)
 Patient reports right leg edema and redness that started approximately 10 days ago. Patient denies any fever. Patient denies any history of blood clots. Patient has a history of chronic ankle edema and takes torsemide . Last dose of torsemide  taking yesterday.

## 2024-05-30 NOTE — Progress Notes (Signed)
 Symptom Management Clinic [facility] at [facility] [phone] [phone]  Patient Care Team: Rudolpho Norleen BIRCH, MD as PCP - General (Internal Medicine) Darron Deatrice LABOR, MD as PCP - Cardiology (Cardiology) Jacobo Evalene PARAS, MD as Consulting Physician (Oncology) Lenn Aran, MD as Consulting Physician (Radiation Oncology)   NAME OF PATIENT: Bobby Sandoval  [id number]  Aug 22, 1946   DATE OF VISIT: 05/30/24  REASON FOR CONSULT: Bobby Sandoval is a 78 y.o. 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  He is on current ADT and XRT for prostate cancer.    INTERVAL HISTORY: Patient sent to [facility] for evaluation of RLE edema.   Patient presents with 24 hours of right lower extremity swelling, redness.  No significant pain.  Denies fever or chills.  Denies trauma or injury.  Patient does have dry cracked skin with some small amounts of bleeding.  Denies any neurologic complaints. Denies recent fevers or illnesses. Denies any easy bleeding or bruising. Reports good appetite and denies weight loss. Denies chest pain. Denies any nausea, vomiting, constipation, or diarrhea. Denies urinary complaints. Patient offers no further specific complaints today.   PAST MEDICAL HISTORY: Past Medical History:  Diagnosis Date   AAA (abdominal aortic aneurysm) (HCC)    a. 2012 3.4 cm; b. 07/2017 Abd u/s: Dist abd Ao dil up to 4.8cm; c. 03/2018 CT Abd: infrarenal AAA 5.3 x 5.7cm; d. 06/2018 EVAR (28mm prox/23mm dist, 18cm length Gore Excluder Endoprosthesis).   Aortic valve sclerosis    Arthritis    CAD (coronary artery disease)    a. 2007 Inf MI w/ BMS to RCA x 2; b. 2008 PCI/DES to mid LAD @ [facility]; c. 07/2015 Cath: LAD 100 prox to prev placed stent w/ R->L collats, RCA stents patent. EF 50-55%. Med Rx; d. 04/2018 MV: EF 48%, mid-dist ant/apical/apical-lat ischemia->med  rx.   Cataracts, bilateral    Chronic heart failure with preserved ejection fraction (HFpEF) (HCC)    a. 02/2014 Echo: EF 55-65%. Gr1 DD; b. 01/2018 Echo: EF 60-65%, Gr1 DD, mild MR, mildly dil LA. nl RV fxn. Nl PASP; c. 02/2024 Echo: EF 55-60%, no rwma, GrI DD, nl RV fxn, sev dil LA, mild-mod MR. AoV sclerosis.   COPD (chronic obstructive pulmonary disease) (HCC)    ETOH abuse    Grade I diastolic dysfunction    Hepatic steatosis    a. 03/2018 CT Abd: hepatic steatosis and possible early cirrhotic morphology of liver.   History of heart attack    History of kidney stones    Hyperlipidemia    Hypertension    MI (myocardial infarction) (HCC)    Mild mitral regurgitation by prior echocardiogram    Obesity    Panic disorder    Panic disorder    Status post AAA (abdominal aortic aneurysm) repair    Tobacco abuse     PAST SURGICAL HISTORY:  Past Surgical History:  Procedure Laterality Date   25 GAUGE PARS PLANA VITRECTOMY WITH 20 GAUGE MVR PORT FOR MACULAR HOLE Right 01/14/2021   Procedure: 25 GAUGE PARS PLANA VITRECTOMY WITH 20 GAUGE MVR PORT FOR MACULAR HOLE;  Surgeon: Alvia Norleen BIRCH, MD;  Location: [facility] OR;  Service: Ophthalmology;  Laterality: Right;   AIR/FLUID EXCHANGE Right 01/14/2021   Procedure: AIR/FLUID EXCHANGE;  Surgeon: Alvia Norleen BIRCH, MD;  Location: [facility] OR;  Service: Ophthalmology;  Laterality: Right;   BACK SURGERY  CARDIAC CATHETERIZATION  08/2006   x1 stent MC. Mid RCA: 3.5 x 16 mm overlap with 3.5 x 24 mm Liberte bare-metal stents   CARDIAC CATHETERIZATION  10/2006   x1 stent VA: Mid LAD: 2.5 x 28 mm Cypher drug-eluting   CARDIAC CATHETERIZATION N/A 07/25/2015   Procedure: Left Heart Cath and Coronary Angiography;  Surgeon: Deatrice DELENA Cage, MD;  Location: [facility] INVASIVE CV LAB;  Service: Cardiovascular;  Laterality: N/A;   Cataract surgery Bilateral    COLONOSCOPY     coronary stents     ENDOVASCULAR REPAIR/STENT GRAFT N/A 06/08/2018   Procedure: ENDOVASCULAR  REPAIR/STENT GRAFT;  Surgeon: Marea Selinda RAMAN, MD;  Location: [facility] INVASIVE CV LAB;  Service: Cardiovascular;  Laterality: N/A;   GAS INSERTION Right 01/14/2021   Procedure: INSERTION OF GAS;  Surgeon: Alvia Norleen BIRCH, MD;  Location: [facility] OR;  Service: Ophthalmology;  Laterality: Right;   GAS/FLUID EXCHANGE Right 01/14/2021   Procedure: GAS/FLUID EXCHANGE;  Surgeon: Alvia Norleen BIRCH, MD;  Location: [facility] OR;  Service: Ophthalmology;  Laterality: Right;   MEMBRANE PEEL Right 01/14/2021   Procedure: MEMBRANE PEEL;  Surgeon: Alvia Norleen BIRCH, MD;  Location: [facility] OR;  Service: Ophthalmology;  Laterality: Right;   MICRODISCECTOMY LUMBAR     MICROLARYNGOSCOPY Bilateral 07/20/2023   Procedure: MICRODIRECT LARYNGOSCOPY WITH BIOPSY OF VOCAL CORD LESION;  Surgeon: Herminio Miu, MD;  Location: [facility] ORS;  Service: ENT;  Laterality: Bilateral;   PHOTOCOAGULATION WITH LASER Right 01/14/2021   Procedure: PHOTOCOAGULATION WITH LASER;  Surgeon: Alvia Norleen BIRCH, MD;  Location: [facility] OR;  Service: Ophthalmology;  Laterality: Right;   SERUM PATCH Right 01/14/2021   Procedure: SERUM PATCH;  Surgeon: Alvia Norleen BIRCH, MD;  Location: [facility] OR;  Service: Ophthalmology;  Laterality: Right;    HEMATOLOGY/ONCOLOGY HISTORY:  Oncology History  Squamous cell carcinoma of right vocal cord (HCC)  08/06/2023 Initial Diagnosis   Squamous cell carcinoma of right vocal cord (HCC)   08/06/2023 Cancer Staging   Staging form: Larynx - Glottis, AJCC 8th Edition - Clinical stage from 08/06/2023: Stage III (cT3, cN0, cM0) - Signed by Jacobo Evalene PARAS, MD on 08/06/2023 Stage prefix: Initial diagnosis   08/24/2023 -  Chemotherapy   Patient is on Treatment Plan : HEAD/NECK Cisplatin  (40) q7d       ALLERGIES:  has no known allergies.  MEDICATIONS:  Current Outpatient Medications  Medication Sig Dispense Refill   ALPRAZolam  (XANAX ) 0.5 MG tablet Take 1 tablet (0.5 mg total) by mouth 3 (three) times daily as needed for anxiety. 90 tablet  0   aspirin  EC 81 MG tablet Take 1 tablet (81 mg total) by mouth daily. 90 tablet 3   atorvastatin  (LIPITOR) 40 MG tablet Take 1 tablet (40 mg total) by mouth daily. 30 tablet 1   carvedilol  (COREG ) 3.125 MG tablet Take 1 tablet (3.125 mg total) by mouth 2 (two) times daily with a meal. 180 tablet 3   enalapril  (VASOTEC ) 20 MG tablet Take 1 tablet (20 mg total) by mouth 2 (two) times daily. 60 tablet 3   ketoconazole (NIZORAL) 2 % shampoo Apply 1 Application topically daily.     magic mouthwash (multi-ingredient) oral suspension Swish and swallow 5-10 mLs 4 (four) times daily as needed. 480 mL 3   Multiple Vitamins-Minerals (PRESERVISION AREDS 2) CAPS Take 1 tablet by mouth 2 (two) times daily.     nitroGLYCERIN  (NITROSTAT ) 0.4 MG SL tablet Place 1 tablet (0.4 mg total) under the tongue every 5 (five) minutes as needed for  chest pain. 25 tablet 1   ondansetron  (ZOFRAN ) 8 MG tablet Take 1 tablet (8 mg total) by mouth every 8 (eight) hours as needed for nausea or vomiting. Start on the third day after cisplatin . 60 tablet 1   prochlorperazine  (COMPAZINE ) 10 MG tablet Take 1 tablet (10 mg total) by mouth every 6 (six) hours as needed (Nausea or vomiting). 60 tablet 1   sucralfate  (CARAFATE ) 1 g tablet TAKE ONE TABLET (1 GRAM TOTAL) BY MOUTH FOUR TIMES DAILY. 120 tablet 0   torsemide  (DEMADEX ) 20 MG tablet Take 20 mg by mouth daily as needed.     VENTOLIN  HFA 108 (90 Base) MCG/ACT inhaler INHALE TWO PUFFS INTO THE LUNGS EVERY FOUR HOURS AS NEEDED FOR WHEEZING OR SHORTNESS OF BREATH (COUGH, SHORTNESS OF BREATH OR WHEEZING.). 18 each 1   No current facility-administered medications for this visit.    VITAL SIGNS: BP (!) 193/77 (BP Location: Left Arm, Patient Position: Sitting, Cuff Size: Normal)   Pulse (!) 54   Temp 97.8 F (36.6 C) (Tympanic)   Resp 20   Ht 6' 1 (1.854 m)   Wt 174 lb (78.9 kg)   BMI 22.96 kg/m  Filed Weights   05/30/24 0957  Weight: 174 lb (78.9 kg)    Estimated body  mass index is 22.96 kg/m as calculated from the following:   Height as of this encounter: 6' 1 (1.854 m).   Weight as of this encounter: 174 lb (78.9 kg).  LABS: CBC:    Component Value Date/Time   WBC 4.2 02/02/2024 1248   HGB 10.2 (L) 02/02/2024 1248   HGB 10.8 (L) 10/11/2023 0833   HGB 16.7 02/03/2018 0902   HCT 30.5 (L) 02/02/2024 1248   HCT 47.9 02/03/2018 0902   PLT 131 (L) 02/02/2024 1248   PLT 149 (L) 10/11/2023 0833   PLT 180 02/03/2018 0902   MCV 93.8 02/02/2024 1248   MCV 92 02/03/2018 0902   NEUTROABS 2.2 02/02/2024 1248   NEUTROABS 3.3 01/13/2018 1049   LYMPHSABS 1.1 02/02/2024 1248   LYMPHSABS 1.4 01/13/2018 1049   MONOABS 0.6 02/02/2024 1248   EOSABS 0.2 02/02/2024 1248   EOSABS 0.1 01/13/2018 1049   BASOSABS 0.0 02/02/2024 1248   BASOSABS 0.0 01/13/2018 1049   Comprehensive Metabolic Panel:    Component Value Date/Time   NA 134 (L) 02/02/2024 1248   NA 138 02/03/2018 0902   K 4.7 02/02/2024 1248   CL 102 02/02/2024 1248   CO2 25 02/02/2024 1248   BUN 15 02/02/2024 1248   BUN 7 (L) 02/03/2018 0902   CREATININE 1.50 (H) 03/20/2024 1045   CREATININE 1.42 (H) 02/02/2024 1248   GLUCOSE 110 (H) 02/02/2024 1248   CALCIUM  8.9 02/02/2024 1248   AST 22 01/14/2021 0919   ALT 22 01/14/2021 0919   ALKPHOS 63 01/14/2021 0919   BILITOT 1.2 01/14/2021 0919   BILITOT 0.7 02/03/2018 0902   PROT 6.2 (L) 01/14/2021 0919   PROT 6.3 02/03/2018 0902   ALBUMIN 3.8 01/14/2021 0919   ALBUMIN 3.9 02/03/2018 0902    RADIOGRAPHIC STUDIES: DG SWALLOW FUNC OP MEDICARE SPEECH PATH Result Date: 05/18/2024 Table formatting from the original result was not included. Modified Barium Swallow Study Patient Details Name: CEASAR DECANDIA MRN: [id number] Date of Birth: August 17, 1946 Today's Date: 05/18/2024 HPI/PMH: HPI: Patient is a 78 year old male who was referred for a Modified Barium Swallow Study by his ENT, [name], d/t trouble swallowing. Per ENt note dated 05/11/2024  laryngoscopy revealed  significant soft tissue swelling bilateral arytenoids, cords could not be fully visualized. Pt started on Prednisone dose pack as well as Cefdinir (300 mg). 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 Patient completed weekly cisplatin  on October 20, 2023.  XRT completed 1 week later on October 27, 2023.     CT soft tissue neck on 03/20/2024  No discrete enhancing mass lesion in the larynx. Diffuse edematous post  treatment changes in the vocal cords bilaterally and larynx. Clinical Impression: Pt reports improvement in swallow function since seeing ENT - I have been taking that steriod for 7 days now.  Pt benefited from frequent redirection to task d/t continued off-topic inappropriate comments to staff. As a result of this behavior and suspected poor health literacy, uncertain if pt was able to retain information.  Pt presents with adequate oropharyngeal abilities when consuming thin liquids, nectar thick liquids, puree, graham cracker with barium paste as well as whole barium tablet with thin liquids. At present time, pt's risk of aspiration appears reduced when consuming a regular diet with thin liquids, medicine whole with thin liquids. Education was provided to pt and his wife on the results of this study as well as late effects of radiation on swallow function. Continue to recommend follow up with Outpatient ST services for education and prevention of future dysphagia related to radiation. Pt continues to decline services. Factors that may increase risk of adverse event in presence of aspiration [name] & [name] 2021): Factors that may increase risk of adverse event in presence of aspiration [name] & [name] 2021): -- (history of radiation) Recommendations/Plan: Swallowing Evaluation Recommendations Swallowing Evaluation Recommendations Recommendations: PO diet PO Diet Recommendation: Regular;  Dysphagia 3 (Mechanical soft); Thin liquids (Level 0) Liquid Administration via: Cup Medication Administration: Whole meds with liquid Supervision: Patient able to self-feed Swallowing strategies  : Minimize environmental distractions; Slow rate; Small bites/sips Postural changes: Position pt fully upright for meals; Stay upright 30-60 min after meals Oral care recommendations: Oral care BID (2x/day) Treatment Plan Treatment Plan Treatment recommendations: -- (pt decines) Follow-up recommendations: Outpatient SLP (pt declined) Functional status assessment: Patient has not had a recent decline in their functional status. Recommendations Recommendations for follow up therapy are one component of a multi-disciplinary discharge planning process, led by the attending physician.  Recommendations may be updated based on patient status, additional functional criteria and insurance authorization. Assessment: Orofacial Exam: Orofacial Exam Oral Cavity: Oral Hygiene: WFL Oral Cavity - Dentition: Edentulous Orofacial Anatomy: WFL Oral Motor/Sensory Function: WFL Anatomy: Anatomy: WFL Boluses Administered: Boluses Administered Boluses Administered: Thin liquids (Level 0); Mildly thick liquids (Level 2, nectar thick); Puree; Solid  Oral Impairment Domain: Oral Impairment Domain Lip Closure: No labial escape Tongue control during bolus hold: Not tested (pt frequently distacted and unable to perform) Bolus preparation/mastication: Slow prolonged chewing/mashing with complete recollection Bolus transport/lingual motion: Slow tongue motion Oral residue: Complete oral clearance Initiation of pharyngeal swallow : Posterior angle of the ramus; Valleculae  Pharyngeal Impairment Domain: Pharyngeal Impairment Domain Soft palate elevation: No bolus between soft palate (SP)/pharyngeal wall (PW) Laryngeal elevation: Complete superior movement of thyroid cartilage with complete approximation of arytenoids to epiglottic petiole Anterior hyoid  excursion: Complete anterior movement Epiglottic movement: Complete inversion Laryngeal vestibule closure: Complete, no air/contrast in laryngeal vestibule Pharyngeal stripping wave : Present - complete Pharyngoesophageal segment opening: Complete distension and complete duration, no obstruction of flow Tongue base retraction: No contrast between tongue  base and posterior pharyngeal wall (PPW) Pharyngeal residue: Complete pharyngeal clearance  Esophageal Impairment Domain: Esophageal Impairment Domain Esophageal clearance upright position: Complete clearance, esophageal coating Pill: Pill Consistency administered: Thin liquids (Level 0) Thin liquids (Level 0): [facility] Penetration/Aspiration Scale Score: Penetration/Aspiration Scale Score 1.  Material does not enter airway: Thin liquids (Level 0); Mildly thick liquids (Level 2, nectar thick); Puree; Solid; Pill Compensatory Strategies: No data recorded  General Information: Caregiver present: Yes  Diet Prior to this Study: Regular; Thin liquids (Level 0)   Temperature : Normal   Respiratory Status: WFL   Supplemental O2: None (Room air)   History of Recent Intubation: No  Behavior/Cognition: Alert; Cooperative; Pleasant mood (poor health literacy) Self-Feeding Abilities: Able to self-feed Baseline vocal quality/speech: Normal Volitional Cough: Able to elicit Volitional Swallow: Able to elicit Exam Limitations: -- (frequent redirection to task d/t off-topic conversation) Goal Planning: No data recorded Barriers to Reach Goals: Behavior; Motivation (poor health literacy) No data recorded Patient/Family Stated Goal: none stated Consulted and agree with results and recommendations: Patient; Family member/caregiver Pain: Pain Assessment Pain Assessment: No/denies pain End of Session: Start Time:SLP Start Time (ACUTE ONLY): 1250 Stop Time: SLP Stop Time (ACUTE ONLY): 1315 Time Calculation:SLP Time Calculation (min) (ACUTE ONLY): 25 min Charges: SLP Evaluations $ SLP Speech  Visit: 1 Visit SLP Evaluations $MBS Swallow: 1 Procedure SLP visit diagnosis: SLP Visit Diagnosis: Dysphagia, unspecified (R13.10) Past Medical History: Past Medical History: Diagnosis Date  AAA (abdominal aortic aneurysm) (HCC)   a. 2012 3.4 cm; b. 07/2017 Abd u/s: Dist abd Ao dil up to 4.8cm; c. 03/2018 CT Abd: infrarenal AAA 5.3 x 5.7cm; d. 06/2018 EVAR (28mm prox/30mm dist, 18cm length Gore Excluder Endoprosthesis).  Aortic valve sclerosis   Arthritis   CAD (coronary artery disease)   a. 2007 Inf MI w/ BMS to RCA x 2; b. 2008 PCI/DES to mid LAD @ [facility]; c. 07/2015 Cath: LAD 100 prox to prev placed stent w/ R->L collats, RCA stents patent. EF 50-55%. Med Rx; d. 04/2018 MV: EF 48%, mid-dist ant/apical/apical-lat ischemia->med rx.  Cataracts, bilateral   Chronic heart failure with preserved ejection fraction (HFpEF) (HCC)   a. 02/2014 Echo: EF 55-65%. Gr1 DD; b. 01/2018 Echo: EF 60-65%, Gr1 DD, mild MR, mildly dil LA. nl RV fxn. Nl PASP; c. 02/2024 Echo: EF 55-60%, no rwma, GrI DD, nl RV fxn, sev dil LA, mild-mod MR. AoV sclerosis.  COPD (chronic obstructive pulmonary disease) (HCC)   ETOH abuse   Grade I diastolic dysfunction   Hepatic steatosis   a. 03/2018 CT Abd: hepatic steatosis and possible early cirrhotic morphology of liver.  History of heart attack   History of kidney stones   Hyperlipidemia   Hypertension   MI (myocardial infarction) (HCC)   Mild mitral regurgitation by prior echocardiogram   Obesity   Panic disorder   Panic disorder   Status post AAA (abdominal aortic aneurysm) repair   Tobacco abuse  Past Surgical History: Past Surgical History: Procedure Laterality Date  25 GAUGE PARS PLANA VITRECTOMY WITH 20 GAUGE MVR PORT FOR MACULAR HOLE Right 01/14/2021  Procedure: 25 GAUGE PARS PLANA VITRECTOMY WITH 20 GAUGE MVR PORT FOR MACULAR HOLE;  Surgeon: Alvia Norleen BIRCH, MD;  Location: [facility] OR;  Service: Ophthalmology;  Laterality: Right;  AIR/FLUID EXCHANGE Right 01/14/2021  Procedure: AIR/FLUID EXCHANGE;   Surgeon: Alvia Norleen BIRCH, MD;  Location: [facility] OR;  Service: Ophthalmology;  Laterality: Right;  BACK SURGERY    CARDIAC CATHETERIZATION  08/2006  x1 stent  MC. Mid RCA: 3.5 x 16 mm overlap with 3.5 x 24 mm Liberte bare-metal stents  CARDIAC CATHETERIZATION  10/2006  x1 stent VA: Mid LAD: 2.5 x 28 mm Cypher drug-eluting  CARDIAC CATHETERIZATION N/A 07/25/2015  Procedure: Left Heart Cath and Coronary Angiography;  Surgeon: Deatrice DELENA Cage, MD;  Location: [facility] INVASIVE CV LAB;  Service: Cardiovascular;  Laterality: N/A;  Cataract surgery Bilateral   COLONOSCOPY    coronary stents    ENDOVASCULAR REPAIR/STENT GRAFT N/A 06/08/2018  Procedure: ENDOVASCULAR REPAIR/STENT GRAFT;  Surgeon: Marea Selinda RAMAN, MD;  Location: [facility] INVASIVE CV LAB;  Service: Cardiovascular;  Laterality: N/A;  GAS INSERTION Right 01/14/2021  Procedure: INSERTION OF GAS;  Surgeon: Alvia Norleen BIRCH, MD;  Location: [facility] OR;  Service: Ophthalmology;  Laterality: Right;  GAS/FLUID EXCHANGE Right 01/14/2021  Procedure: GAS/FLUID EXCHANGE;  Surgeon: Alvia Norleen BIRCH, MD;  Location: [facility] OR;  Service: Ophthalmology;  Laterality: Right;  MEMBRANE PEEL Right 01/14/2021  Procedure: MEMBRANE PEEL;  Surgeon: Alvia Norleen BIRCH, MD;  Location: [facility] OR;  Service: Ophthalmology;  Laterality: Right;  MICRODISCECTOMY LUMBAR    MICROLARYNGOSCOPY Bilateral 07/20/2023  Procedure: MICRODIRECT LARYNGOSCOPY WITH BIOPSY OF VOCAL CORD LESION;  Surgeon: Herminio Miu, MD;  Location: [facility] ORS;  Service: ENT;  Laterality: Bilateral;  PHOTOCOAGULATION WITH LASER Right 01/14/2021  Procedure: PHOTOCOAGULATION WITH LASER;  Surgeon: Alvia Norleen BIRCH, MD;  Location: [facility] OR;  Service: Ophthalmology;  Laterality: Right;  SERUM PATCH Right 01/14/2021  Procedure: SERUM PATCH;  Surgeon: Alvia Norleen BIRCH, MD;  Location: [facility] OR;  Service: Ophthalmology;  Laterality: Right; Happi Overton 05/18/2024, 4:11 PM CLINICAL DATA:  78 year old male with a history of head and neck cancer s/p chemoradiation with  dysphagia. Recently completed prednisone taper. EXAM: MODIFIED BARIUM SWALLOW TECHNIQUE: Different consistencies of barium were administered orally to the patient by the Speech Pathologist. Imaging of the pharynx was performed in the lateral projection. McKenzie McInnis PA-C was present in the fluoroscopy room during this study, which was supervised and interpreted by Dr. Harrietta Sherry. FLUOROSCOPY: Radiation Exposure Index (as provided by the fluoroscopic device): 7.0 mGy Kerma COMPARISON:  CT Neck dated 03/20/2024. FINDINGS: Vestibular  Penetration:  None seen. Aspiration:  None seen. Other:  None. IMPRESSION: Unremarkable modified barium swallow. Please refer to the Speech Pathologists report for complete details and recommendations. Electronically Signed   By: Harrietta Sherry M.D.   On: 05/18/2024 15:17   PERFORMANCE STATUS (ECOG) : 1 - Symptomatic but completely ambulatory  Review of Systems Unless otherwise noted, a complete review of systems is negative.  Physical Exam General: NAD Cardiovascular: regular rate and rhythm Pulmonary: clear ant fields Abdomen: soft, nontender, + bowel sounds GU: no suprapubic tenderness Extremities: BLE R/L edema, dry cracking skin, no joint deformities Skin: no rashes Neurological: Weakness but otherwise nonfocal    IMPRESSION/PLAN: Prostate cancer -on ADT per urology.  Also on active XRT.  RLE edema -there is significant swelling and erythema to the leg.  Appearance is concerning for possible cellulitis.  Although less likely, will obtain Dopplers to rule out DVT.  Will start on cephalexin  500 mg 3 times daily to account for renal dosing.  RN spoke with PCP office and Dr. Vicci will see patient tomorrow for follow-up. ED triggers reviewed.   Case and plan discussed with Dr. Jacobo.   Patient expressed understanding and was in agreement with this plan. He also understands that He can call clinic at any time with any questions, concerns, or  complaints.   Thank you for  allowing me to participate in the care of this very pleasant patient.   Time Total: 15 minutes  Visit consisted of counseling and education dealing with the complex and emotionally intense issues of symptom management in the setting of serious illness.Greater than 50%  of this time was spent counseling and coordinating care related to the above assessment and plan.  Signed by: Fonda Mower, PhD, NP-C

## 2024-05-30 NOTE — Progress Notes (Signed)
 patient takes bp at home. encouraged pt recheck and home and log bps. pt held toresmide today due to multiple medical apts. pt has not seen pcp in over a year due to his frequency of medical apts. he saw his cardiologist 10 days ago who is managing heart meds. Pt educated on etoh use.

## 2024-05-30 NOTE — Progress Notes (Signed)
 Rn scheduled apt with pt's primary care Dr. Rudolpho at [facility] tomorrow at 05/30/2024 at 330 pm. Pt/wife aware of apts.

## 2024-05-31 ENCOUNTER — Ambulatory Visit
Admission: RE | Admit: 2024-05-31 | Discharge: 2024-05-31 | Disposition: A | Discharge status: Home / Self Care | Source: Ambulatory Visit | Attending: Hospice and Palliative Medicine | Admitting: Hospice and Palliative Medicine

## 2024-05-31 DIAGNOSIS — R600 Localized edema: Secondary | ICD-10-CM | POA: Insufficient documentation

## 2024-05-31 DIAGNOSIS — I10 Essential (primary) hypertension: Secondary | ICD-10-CM | POA: Diagnosis not present

## 2024-05-31 DIAGNOSIS — C61 Malignant neoplasm of prostate: Secondary | ICD-10-CM | POA: Diagnosis not present

## 2024-05-31 DIAGNOSIS — L03115 Cellulitis of right lower limb: Secondary | ICD-10-CM | POA: Diagnosis not present

## 2024-06-01 ENCOUNTER — Telehealth: Payer: Self-pay | Admitting: *Deleted

## 2024-06-01 DIAGNOSIS — R1310 Dysphagia, unspecified: Secondary | ICD-10-CM | POA: Diagnosis not present

## 2024-06-01 DIAGNOSIS — J040 Acute laryngitis: Secondary | ICD-10-CM | POA: Diagnosis not present

## 2024-06-01 NOTE — Telephone Encounter (Signed)
 I contacted patient to discuss his doppler results. (Negative for DVT). Patient reports that he saw pcp yesterday. He was started on amlodipine 5 mg daily for bp. His bp was 140/70 this am at ent office. He stated that I got a good report for Dr. Herminio and he doesn't have to see me back for several months. Pt instructed to follow-up with pcp if cellulitis doesn't improve. He is keeping a bp log at home. He was instructed to continue with current keflex  prescription. Pt gave verbal understanding of plan.

## 2024-06-12 ENCOUNTER — Ambulatory Visit

## 2024-06-13 ENCOUNTER — Ambulatory Visit

## 2024-06-13 ENCOUNTER — Ambulatory Visit: Payer: Self-pay | Admitting: Surgery

## 2024-06-13 DIAGNOSIS — K4090 Unilateral inguinal hernia, without obstruction or gangrene, not specified as recurrent: Secondary | ICD-10-CM | POA: Diagnosis not present

## 2024-06-13 NOTE — H&P (View-Only) (Signed)
 Subjective:   CC: Non-recurrent unilateral inguinal hernia without obstruction or gangrene [K40.90]  HPI: referred by Norleen Alm Rower, MD for evaluation of above.   History of Present Illness Bobby Sandoval is a 78 year old male who presents with a bump in his groin.  He describes the bump as mobile and associated with intestinal gurgling. A truss initially provided relief but is now ineffective. He experiences nausea and vomiting, which he associates with the hernia, and finds relief by lying on his left side. He reports no significant pain during these episodes. He has delayed radiation therapy for prostate cancer to address the hernia.   Past Medical History:  has a past medical history of Colon polyp, Heart disease, History of kidney stones, Hypertension, Kidney stones, Myocardial infarction (CMS/HHS-HCC) (2007), and Obesity.  Past Surgical History:  Past Surgical History:  Procedure Laterality Date   kidney stones     MICRODISCECTOMY LUMBAR     L5-S1    stents      Family History: family history includes Diabetes in his mother; Kidney disease in his father; Stroke in his mother.  Social History:  reports that he has been smoking cigarettes. He has a 80 pack-year smoking history. He has never used smokeless tobacco. He reports current alcohol  use of about 36.0 standard drinks of alcohol  per week. He reports that he does not use drugs.  Current Medications: has a current medication list which includes the following prescription(s): alprazolam , aspirin , atorvastatin , carvedilol , enalapril , nitroglycerin , torsemide , and sildenafil.  Allergies:  Allergies as of 06/13/2024   (No Known Allergies)    ROS:  A 15 point review of systems was performed and pertinent positives and negatives noted in HPI   Objective:     BP (!) 176/66   Pulse 57   Ht 188 cm (6' 2)   Wt 79.4 kg (175 lb)   BMI 22.47 kg/m   Constitutional :  Alert, cooperative, no distress   Lymphatics/Throat:  Supple, no lymphadenopathy  Respiratory:  clear to auscultation bilaterally  Cardiovascular:  regular rate and rhythm  Gastrointestinal: soft, non-tender; bowel sounds normal; no masses,  no organomegaly. inguinal hernia noted.  moderate, reducible, no overlying skin changes, and right  Musculoskeletal: Steady gait and movement  Skin: Cool and moist  Psychiatric: Normal affect, non-agitated, not confused       LABS:  N/a   RADS: N/a Assessment:       Non-recurrent unilateral inguinal hernia without obstruction or gangrene [K40.90]  Plan:     1. Non-recurrent unilateral inguinal hernia without obstruction or gangrene [K40.90]   Discussed the risk of surgery including recurrence, which can be up to 50% in the case of incisional or complex hernias, possible use of prosthetic materials (mesh) and the increased risk of mesh infxn if used, bleeding, chronic pain, post-op infxn, post-op SBO or ileus, and possible re-operation to address said risks. The risks of general anesthetic, if used, includes MI, CVA, sudden death or even reaction to anesthetic medications also discussed. Alternatives include continued observation.  Benefits include possible symptom relief, prevention of incarceration, strangulation, enlargement in size over time, and the risk of emergency surgery in the face of strangulation.   Typical post-op recovery time of 3-5 days with 2 weeks of activity restrictions were also discussed.  ED return precautions given for sudden increase in pain, size of hernia with accompanying fever, nausea, and/or vomiting.  The patient verbalized understanding and all questions were answered to the patient's satisfaction.  2. Patient has elected to proceed with surgical treatment. Procedure will be scheduled.  Non-recurrent unilateral inguinal hernia without obstruction or gangrene [K40.90], right, robotic assisted laparoscopic [phone]. Hx of throat cancer, should be ok  with GETA, but will confirm with anesthesia.  labs/images/medications/previous chart entries reviewed personally and relevant changes/updates noted above.

## 2024-06-13 NOTE — H&P (Addendum)
 Subjective:   CC: Non-recurrent unilateral inguinal hernia without obstruction or gangrene [K40.90]  HPI: referred by Norleen Alm Rower, MD for evaluation of above.   History of Present Illness Bobby Sandoval is a 78 year old male who presents with a bump in his groin.  He describes the bump as mobile and associated with intestinal gurgling. A truss initially provided relief but is now ineffective. He experiences nausea and vomiting, which he associates with the hernia, and finds relief by lying on his left side. He reports no significant pain during these episodes. He has delayed radiation therapy for prostate cancer to address the hernia.   Past Medical History:  has a past medical history of Colon polyp, Heart disease, History of kidney stones, Hypertension, Kidney stones, Myocardial infarction (CMS/HHS-HCC) (2007), and Obesity.  Past Surgical History:  Past Surgical History:  Procedure Laterality Date   kidney stones     MICRODISCECTOMY LUMBAR     L5-S1    stents      Family History: family history includes Diabetes in his mother; Kidney disease in his father; Stroke in his mother.  Social History:  reports that he has been smoking cigarettes. He has a 80 pack-year smoking history. He has never used smokeless tobacco. He reports current alcohol  use of about 36.0 standard drinks of alcohol  per week. He reports that he does not use drugs.  Current Medications: has a current medication list which includes the following prescription(s): alprazolam , aspirin , atorvastatin , carvedilol , enalapril , nitroglycerin , torsemide , and sildenafil.  Allergies:  Allergies as of 06/13/2024   (No Known Allergies)    ROS:  A 15 point review of systems was performed and pertinent positives and negatives noted in HPI   Objective:     BP (!) 176/66   Pulse 57   Ht 188 cm (6' 2)   Wt 79.4 kg (175 lb)   BMI 22.47 kg/m   Constitutional :  Alert, cooperative, no distress   Lymphatics/Throat:  Supple, no lymphadenopathy  Respiratory:  clear to auscultation bilaterally  Cardiovascular:  regular rate and rhythm  Gastrointestinal: soft, non-tender; bowel sounds normal; no masses,  no organomegaly. inguinal hernia noted.  moderate, reducible, no overlying skin changes, and right  Musculoskeletal: Steady gait and movement  Skin: Cool and moist  Psychiatric: Normal affect, non-agitated, not confused       LABS:  N/a   RADS: N/a Assessment:       Non-recurrent unilateral inguinal hernia without obstruction or gangrene [K40.90]  Plan:     1. Non-recurrent unilateral inguinal hernia without obstruction or gangrene [K40.90]   Discussed the risk of surgery including recurrence, which can be up to 50% in the case of incisional or complex hernias, possible use of prosthetic materials (mesh) and the increased risk of mesh infxn if used, bleeding, chronic pain, post-op infxn, post-op SBO or ileus, and possible re-operation to address said risks. The risks of general anesthetic, if used, includes MI, CVA, sudden death or even reaction to anesthetic medications also discussed. Alternatives include continued observation.  Benefits include possible symptom relief, prevention of incarceration, strangulation, enlargement in size over time, and the risk of emergency surgery in the face of strangulation.   Typical post-op recovery time of 3-5 days with 2 weeks of activity restrictions were also discussed.  ED return precautions given for sudden increase in pain, size of hernia with accompanying fever, nausea, and/or vomiting.  The patient verbalized understanding and all questions were answered to the patient's satisfaction.  2. Patient has elected to proceed with surgical treatment. Procedure will be scheduled.  Non-recurrent unilateral inguinal hernia without obstruction or gangrene [K40.90], right, robotic assisted laparoscopic [phone]. Hx of throat cancer, should be ok  with GETA, but will confirm with anesthesia.  labs/images/medications/previous chart entries reviewed personally and relevant changes/updates noted above.

## 2024-06-14 ENCOUNTER — Encounter
Admission: RE | Admit: 2024-06-14 | Discharge: 2024-06-14 | Disposition: A | Discharge status: Home / Self Care | Source: Ambulatory Visit | Attending: Surgery | Admitting: Surgery

## 2024-06-14 ENCOUNTER — Other Ambulatory Visit: Payer: Self-pay

## 2024-06-14 ENCOUNTER — Ambulatory Visit

## 2024-06-14 DIAGNOSIS — I10 Essential (primary) hypertension: Secondary | ICD-10-CM

## 2024-06-14 DIAGNOSIS — Z01812 Encounter for preprocedural laboratory examination: Secondary | ICD-10-CM

## 2024-06-14 HISTORY — DX: Malignant (primary) neoplasm, unspecified: C80.1

## 2024-06-14 HISTORY — DX: Unilateral inguinal hernia, without obstruction or gangrene, not specified as recurrent: K40.90

## 2024-06-14 HISTORY — DX: Heart failure, unspecified: I50.9

## 2024-06-14 HISTORY — DX: Malignant neoplasm of prostate: C61

## 2024-06-14 HISTORY — DX: Other complications of anesthesia, initial encounter: T88.59XA

## 2024-06-14 NOTE — Patient Instructions (Addendum)
 Your procedure is scheduled on: 06/16/24 - Friday Report to the Registration Desk on the 1st floor of the [facility]. To find out your arrival time, please call [phone] between 1PM - 3PM on: 06/15/24 - Thursday If your arrival time is 6:00 am, do not arrive before that time as the [facility] entrance doors do not open until 6:00 am.  REMEMBER: Instructions that are not followed completely may result in serious medical risk, up to and including death; or upon the discretion of your surgeon and anesthesiologist your surgery may need to be rescheduled.  Do not eat food after midnight the night before surgery.  No gum chewing or hard candies.  You may however, drink CLEAR liquids up to 2 hours before you are scheduled to arrive for your surgery. Do not drink anything within 2 hours of your scheduled arrival time.  Clear liquids include: - water   - apple juice without pulp - gatorade (not RED colors) - black coffee or tea (Do NOT add milk or creamers to the coffee or tea) Do NOT drink anything that is not on this list.   One week prior to surgery: Stop Anti-inflammatories (NSAIDS) such as Advil, Aleve, Ibuprofen, Motrin, Naproxen, Naprosyn and Aspirin  based products such as Excedrin, Goody's Powder, BC Powder. You may take Tylenol  if needed for pain up until the day of surgery.  Stop ANY OVER THE COUNTER supplements until after surgery : MultiVitamin  ON THE DAY OF SURGERY ONLY TAKE THESE MEDICATIONS WITH SIPS OF WATER :  atorvastatin  (LIPITOR)  ALPRAZolam  (XANAX ) if needed   No Alcohol  for 24 hours before or after surgery.  No Smoking including e-cigarettes for 24 hours before surgery.  No chewable tobacco products for at least 6 hours before surgery.  No nicotine patches on the day of surgery.  Do not use any recreational drugs for at least a week (preferably 2 weeks) before your surgery.  Please be advised that the combination of cocaine and anesthesia may have  negative outcomes, up to and including death. If you test positive for cocaine, your surgery will be cancelled.  On the morning of surgery brush your teeth with toothpaste and water , you may rinse your mouth with mouthwash if you wish. Do not swallow any toothpaste or mouthwash.  Do not wear jewelry, make-up, hairpins, clips or nail polish.  For welded (permanent) jewelry: bracelets, anklets, waist bands, etc.  Please have this removed prior to surgery.  If it is not removed, there is a chance that hospital personnel will need to cut it off on the day of surgery.  Do not wear lotions, powders, or perfumes.   Do not shave body hair from the neck down 48 hours before surgery.  Contact lenses, hearing aids and dentures may not be worn into surgery.  Do not bring valuables to the hospital. [facility] is not responsible for any missing/lost belongings or valuables.   Notify your doctor if there is any change in your medical condition (cold, fever, infection).  Wear comfortable clothing (specific to your surgery type) to the hospital.  After surgery, you can help prevent lung complications by doing breathing exercises.  Take deep breaths and cough every 1-2 hours. Your doctor may order a device called an Incentive Spirometer to help you take deep breaths.  When coughing or sneezing, hold a pillow firmly against your incision with both hands. This is called "splinting." Doing this helps protect your incision. It also decreases belly discomfort.  If you are  being admitted to the hospital overnight, leave your suitcase in the car. After surgery it may be brought to your room.  In case of increased patient census, it may be necessary for you, the patient, to continue your postoperative care in the Same Day Surgery department.  If you are being discharged the day of surgery, you will not be allowed to drive home. You will need a responsible individual to drive you home and stay with you for 24  hours after surgery.   If you are taking public transportation, you will need to have a responsible individual with you.  Please call the Pre-admissions Testing Dept. at [phone] if you have any questions about these instructions.  Surgery Visitation Policy:  Patients having surgery or a procedure may have two visitors.  Children under the age of 24 must have an adult with them who is not the patient.  Inpatient Visitation:    Visiting hours are 7 a.m. to 8 p.m. Up to four visitors are allowed at one time in a patient room. The visitors may rotate out with other people during the day.  One visitor age 26 or older may stay with the patient overnight and must be in the room by 8 p.m.   [employer]ch[employer], retail to address health-related social needs:  [email or web address]

## 2024-06-15 ENCOUNTER — Encounter: Payer: Self-pay | Admitting: Urgent Care

## 2024-06-15 ENCOUNTER — Encounter: Payer: Self-pay | Admitting: Surgery

## 2024-06-15 ENCOUNTER — Ambulatory Visit

## 2024-06-15 ENCOUNTER — Inpatient Hospital Stay: Admission: RE | Admit: 2024-06-15 | Source: Ambulatory Visit

## 2024-06-15 MED ORDER — ORAL CARE MOUTH RINSE
15.0000 mL | Freq: Once | OROMUCOSAL | Status: AC
Start: 2024-06-15 — End: 2024-06-16

## 2024-06-15 MED ORDER — CHLORHEXIDINE GLUCONATE 0.12 % MT SOLN
15.0000 mL | Freq: Once | OROMUCOSAL | Status: AC
Start: 2024-06-15 — End: 2024-06-16
  Administered 2024-06-16: 15 mL via OROMUCOSAL

## 2024-06-15 MED ORDER — CEFAZOLIN SODIUM-DEXTROSE 2-4 GM/100ML-% IV SOLN
2.0000 g | INTRAVENOUS | Status: AC
  Administered 2024-06-16: 2 g via INTRAVENOUS

## 2024-06-15 MED ORDER — ACETAMINOPHEN 500 MG PO TABS
1000.0000 mg | ORAL_TABLET | ORAL | Status: AC
  Administered 2024-06-16: 1000 mg via ORAL

## 2024-06-15 MED ORDER — LACTATED RINGERS IV SOLN
INTRAVENOUS | Status: DC
Start: 2024-06-15 — End: 2024-06-16

## 2024-06-15 MED ORDER — CELECOXIB 200 MG PO CAPS
200.0000 mg | ORAL_CAPSULE | ORAL | Status: AC
  Administered 2024-06-16: 200 mg via ORAL

## 2024-06-15 MED ORDER — CHLORHEXIDINE GLUCONATE CLOTH 2 % EX PADS: 6.0000 | MEDICATED_PAD | Freq: Once | CUTANEOUS | Status: DC

## 2024-06-15 NOTE — Progress Notes (Addendum)
 Perioperative / Anesthesia Services  Pre-Admission Testing Clinical Review / Pre-Operative Anesthesia Consult  Date: 06/15/24  PATIENT DEMOGRAPHICS: Name: Bobby Sandoval DOB: [birth year] MRN:   [id number]  Note: Available PAT nursing documentation and vital signs have been reviewed. Clinical nursing staff has updated patient's PMH/PSHx, current medication list, and drug allergies/intolerances to ensure complete and comprehensive history available to assist care teams in MDM as it pertains to the aforementioned surgical procedure and anticipated anesthetic course. Extensive review of available clinical information personally performed. [place] PM[facility] and PSHx updated with any diagnoses/procedures that  may have been inadvertently omitted during his intake with the pre-admission testing department's nursing staff.  PLANNED SURGICAL PROCEDURE(S):   Case: [id number] Date/Time: 06/16/24 0959   Procedure: REPAIR, HERNIA, INGUINAL, ADULT (Right)   Anesthesia type: Spinal   Pre-op diagnosis: K40.90 Non-recurrent unilateral inguinal hernia without obstruction or gangrene   Location: [facility] OR ROOM 04 / [facility]   Surgeons: Tye Millet, DO        CLINICAL DISCUSSION: Bobby Sandoval is a 78 y.o. male who is submitted for pre-surgical anesthesia review and clearance prior to him undergoing the above procedure. Patient is a Former Smoker (100 pack years; quit 07/2023). 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  Patient is followed by cardiology Marsa, MD). He was last seen in the cardiology clinic on 05/12/2021; notes reviewed. At the time of his clinic visit, patient  doing well overall from a cardiovascular perspective. Patient denied any chest pain, shortness of breath, PND, orthopnea, palpitations, significant peripheral edema, weakness, fatigue, vertiginous symptoms, or presyncope/syncope. Patient with a past medical history significant for cardiovascular diagnoses. Documented physical exam was grossly benign, providing no evidence of acute exacerbation and/or decompensation of the patient's known cardiovascular conditions.  Of note, complete records regarding patient's cardiovascular history unavailable for review at time of consult.  Information gathered from patient report and from notes provided by his local specialty care providers.  Patient with a history of an enlarging AAA.  He ultimately underwent endovascular repair on 06/08/2018.  Most recent imaging of the repaired aneurysmal defect was seen on PET scan performed on 04/17/2024.  Study revealed a negative sac measuring 5.5 cm, which was consistent with previous imaging.  Patient reported to have suffered an inferior MI back in 2007.  He underwent PCI with placement of overlapping 3.5 x 16 mm and 3.5 x 24 mm Liberte BMS to the RCA.  Procedure yielded excellent angiographic result and TIMI-3 flow.  Patient underwent diagnostic LEFT heart catheterization again in 2008 revealing a stenotic lesion in the mid LAD.  PCI was performed placing a 2.5 x 28 mm Cypher DES x 1 to the mid LAD.  Procedure yielded excellent angiographic result and TIMI-3 flow.  Patient underwent diagnostic LEFT heart catheterization on 07/25/2015.  Study revealed multivessel CAD: 50% LM, 20% mid-distal LAD, 100% mid LAD, 30% proximal RCA, 40% distal RCA, 50% posterior atrioventricular, and 20% mid RCA.  Occluded mid LAD proximally to a previously placed stent with right to left collaterals.  LVEDP mildly elevated at 17-20 mmHg.  Given the chronic occlusion, collateral formation, and the fact that the mid segment of the LAD itself not felt  to be optimal for PCI, the decision was  made to defer further intervention opting for medical management.  Most recent myocardial perfusion imaging study was performed on 05/04/2018 revealing a mildly reduced left ventricular systolic function with an EF of 48%.  There was hypokinesis in the distal anterior and apical region.  No artifact or left ventricular cavity size enlargement appreciated on review of imaging. SPECT images demonstrated a moderate to large size region of ischemia of moderate intensity in the mid to distal anterior wall, apical region, and apical lateral wall.  Prior cardiac catheterization in 2016 revealed chronically occluded LAD with right to left collaterals. TID ratio = 1.0 (normal range </= 1.1-1.2). Study determined to be moderate risk.  Most recent TTE performed on 02/17/2024 revealed a normal left ventricular systolic function with an EF of 55-60%. There were no regional wall motion abnormalities. Left ventricular diastolic Doppler parameters consistent with abnormal relaxation (G1DD).left atrium was severely dilated.  Right ventricular size and function normal with a TAPSE measuring 2.7 cm  (normal range >/= 1.6 cm).   There was mild sclerosis/calcification of the aortic valve.  There was mild to moderate mitral valve regurgitation.  All transvalvular gradients were noted to be normal providing no evidence of hemodynamically significant valvular stenosis. Aorta normal in size with no evidence of ectasia or aneurysmal dilatation.  Blood pressure uncontrolled at 172/70 mmHg on currently prescribed GDMT including CCB (amlodipine), beta-blocker (carvedilol ), ACE i (enalapril ), and diuretic (torsemide ) therapies.  At the time of the visit, patient had not taken his blood pressure medications due to dysphagia.  Patient has a supply of short acting nitrates (NTG) to use on an as needed basis for recurrent angina/anginal equivalent symptoms; denied recent use. Patient is on atorvastatin   for his HLD diagnosis and ASCVD prevention. In the setting of known cardiovascular diagnoses, it is important note that patient is on a PDE5i medication (sildenafil) for an erectile dysfunction diagnosis. Patient is not diabetic. He does not have an OSAH diagnosis. Patient is able to complete all of his  ADL/IADLs without cardiovascular limitation.  Per the DASI, patient is able to achieve at least 4 METS of physical activity without experiencing any significant degree of angina/anginal equivalent symptoms. No changes were made to his medication regimen during his visit with cardiology.  Patient scheduled to follow-up with outpatient cardiology in 3 months or sooner if needed.  Bobby Sandoval is scheduled for an elective REPAIR, HERNIA, INGUINAL, ADULT (Right) on 05/16/2024 with Dr. Henriette Pierre, DO. Given patient's past medical history significant for cardiovascular diagnoses, presurgical cardiac clearance was sought by the PAT team. Per cardiology, based ACC/AHA guidelines, the patient's past medical history, and the amount of time since his last clinic visit, this patient would be at an overall ACCEPTABLE risk for the planned procedure without further cardiovascular testing or intervention at this time.   In review of the patient's chart, it is noted that he is on daily oral antithrombotic therapy. He has been instructed on recommendations for holding his daily low-dose ASA for 5 days prior to his procedure.  Unclear when patient actually stopped his aspirin .  We were given a very abbreviated amount of time prior to patient's procedure in order to complete PAT workup.  Patient was not seen in PAT until 06/14/2024, at which time he was told not to take any more aspirin .  Patient denies previous perioperative complications with anesthesia in the past. In review his EMR, it is noted that patient underwent a general anesthetic course here at [facility] (ASA III) in 07/2023  without documented complications.   MOST RECENT VITAL SIGNS:    05/30/2024   10:07 AM 05/30/2024   10:02 AM 05/30/2024    9:57 AM  Vitals with BMI  Height   6' 1  Weight   174 lbs  BMI   22.96  Systolic 199 199 806  Diastolic 70 70 77  Pulse   54   PROVIDERS/SPECIALISTS: NOTE: Primary physician provider listed below. Patient may have been seen by APP or partner within same practice.   PROVIDER ROLE / SPECIALTY LAST SHERLEAN Tye Millet, DO General Surgery (Surgeon) 06/13/2024  Rudolpho Norleen BIRCH, MD Primary Care Provider 06/07/2024  Darron Grass, MD Cardiology 05/12/2024  Herminio Miu, MD Otolaryngology ???  Lenn Aran, MD Radiation Oncology 03/27/2024  Jacobo Lye, MD Medical Oncology 02/02/2024   ALLERGIES: No Known Allergies  CURRENT HOME MEDICATIONS: No current facility-administered medications for this encounter.    acetaminophen  (TYLENOL ) 500 MG tablet   ALPRAZolam  (XANAX ) 0.5 MG tablet   amLODipine (NORVASC) 5 MG tablet   aspirin  EC 81 MG tablet   atorvastatin  (LIPITOR) 40 MG tablet   carvedilol  (COREG ) 3.125 MG tablet   enalapril  (VASOTEC ) 20 MG tablet   ketoconazole (NIZORAL) 2 % shampoo   nitroGLYCERIN  (NITROSTAT ) 0.4 MG SL tablet   ondansetron  (ZOFRAN ) 8 MG tablet   prochlorperazine  (COMPAZINE ) 10 MG tablet   sildenafil (VIAGRA) 50 MG tablet   torsemide  (DEMADEX ) 20 MG tablet   VENTOLIN  HFA 108 (90 Base) MCG/ACT inhaler     HISTORY: Past Medical History:  Diagnosis Date   AAA (abdominal aortic aneurysm) (s/p EVAR)    a. 2012 3.4 cm; b. 07/2017 Abd u/s: Dist abd Ao dil up to 4.8cm; c. 03/2018 CT Abd: infrarenal AAA 5.3 x 5.7cm; d. 06/2018 EVAR (28mm prox/64mm dist, 18cm length Gore Excluder Endoprosthesis).   Anemia    Anxiety    a.) on BZO PRN (alprazolam )   Aortic atherosclerosis (HCC)    Arthritis    CAD (coronary artery disease)    a.) Inf MI 2007 -- PCI of the RCA (overlapping 3.5 x 16 mm and 3.5 x 24 mm Liberte BMS) ; b.) PCI 2008 -->  mLAD (2.5 x 28 mm Cypher DES) @ [facility]; c. 07/2015 Cath: LAD 100 prox to prev placed stent w/ R->L collats, RCA stents patent. EF 50-55%. Med Rx; d. 04/2018 MV: EF 48%, mid-dist ant/apical/apical-lat ischemia->med rx.   Cataracts, bilateral    Cellulitis, unspecified cellulitis site 05/2024   Cholelithiasis    Chronic heart failure with preserved ejection fraction (HFpEF) (HCC)    a. 02/2014 Echo: EF 55-65%. Gr1 DD; b. 01/2018 Echo: EF 60-65%, Gr1 DD, mild MR, mildly dil LA. nl RV fxn. Nl PASP; c. 02/2024 Echo: EF 55-60%, no rwma, GrI DD, nl RV fxn, sev dil LA, mild-mod MR. AoV sclerosis.   COPD (chronic obstructive pulmonary disease) (HCC)    Diverticulosis    Erectile dysfunction    a.) on PDE5i (sildenafil)   ETOH abuse    Hepatic steatosis    a. 03/2018 CT Abd: hepatic steatosis and possible early cirrhotic morphology of liver.   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   a.) Bx (+) for moderately differentiating stage III (cT3 N0 M0); Tx'd with weekly cisplatin  (completed 10/20/2023) + XRT (completed 10/27/2023)   Status post bilateral cataract extraction    Thrombocytopenia (HCC)    Tobacco abuse    Past Surgical History:  Procedure Laterality Date   25 GAUGE PARS PLANA VITRECTOMY WITH 20 GAUGE MVR PORT FOR MACULAR HOLE Right 01/14/2021   Procedure: 25 GAUGE PARS PLANA VITRECTOMY WITH 20 GAUGE MVR PORT FOR MACULAR HOLE;  Surgeon: Alvia Norleen BIRCH, MD;  Location: [facility] OR;  Service: Ophthalmology;  Laterality: Right;   AIR/FLUID EXCHANGE Right 01/14/2021   Procedure: AIR/FLUID EXCHANGE;  Surgeon: Alvia Norleen BIRCH, MD;   Location: [facility] OR;  Service: Ophthalmology;  Laterality: Right;   CARDIAC CATHETERIZATION  08/2006   x1 stent MC. Mid RCA: 3.5 x 16 mm overlap with 3.5 x 24 mm Liberte bare-metal stents   CARDIAC CATHETERIZATION  10/2006   x1 stent VA: Mid LAD: 2.5 x 28 mm Cypher drug-eluting   CARDIAC CATHETERIZATION N/A 07/25/2015   Procedure: Left Heart Cath and Coronary Angiography;  Surgeon: Deatrice DELENA Cage, MD;  Location: [facility] INVASIVE CV LAB;  Service: Cardiovascular;  Laterality: N/A;   CATARACT EXTRACTION Bilateral    COLONOSCOPY     ENDOVASCULAR REPAIR/STENT GRAFT N/A 06/08/2018   Procedure: ENDOVASCULAR REPAIR/STENT GRAFT;  Surgeon: Marea Selinda RAMAN, MD;  Location: [facility] INVASIVE CV LAB;  Service: Cardiovascular;  Laterality: N/A;   GAS INSERTION Right 01/14/2021   Procedure: INSERTION OF GAS;  Surgeon: Alvia Norleen BIRCH, MD;  Location: [facility] OR;  Service: Ophthalmology;  Laterality: Right;   GAS/FLUID EXCHANGE Right 01/14/2021   Procedure: GAS/FLUID EXCHANGE;  Surgeon: Alvia Norleen BIRCH, MD;  Location: [facility] OR;  Service: Ophthalmology;  Laterality: Right;   MEMBRANE PEEL Right 01/14/2021   Procedure: MEMBRANE PEEL;  Surgeon: Alvia Norleen BIRCH, MD;  Location: [facility] OR;  Service: Ophthalmology;  Laterality: Right;   MICRODISCECTOMY LUMBAR N/A 06/12/2008   MICROLARYNGOSCOPY Bilateral 07/20/2023   Procedure: MICRODIRECT LARYNGOSCOPY WITH BIOPSY OF VOCAL CORD LESION;  Surgeon: Herminio Miu, MD;  Location: [facility] ORS;  Service: ENT;  Laterality: Bilateral;   PHOTOCOAGULATION WITH LASER Right 01/14/2021   Procedure: PHOTOCOAGULATION WITH LASER;  Surgeon: Alvia Norleen BIRCH, MD;  Location: [facility] OR;  Service: Ophthalmology;  Laterality: Right;   SERUM PATCH Right 01/14/2021   Procedure: SERUM PATCH;  Surgeon: Alvia Norleen BIRCH, MD;  Location: [facility] OR;  Service: Ophthalmology;  Laterality: Right;   Family History  Problem Relation Age of Onset   Hypertension Mother    Heart disease Father    Social History   Tobacco Use    Smoking status: Former    Current packs/day: 0.00    Average packs/day: 2.0 packs/day for 50.0 years (100.0 ttl pk-yrs)    Types: Cigarettes    Quit date: 07/20/2023    Years since quitting: 0.9   Smokeless tobacco: Never  Substance Use Topics   Alcohol  use: Yes    Alcohol /week: 84.0 standard drinks of alcohol     Types: 84 Cans of beer per week    Comment: daily   LABS:  No visits with results within 30 Day(s) from this visit.  Latest known visit with results is:  Hospital Outpatient Visit on 03/20/2024  Component Date Value Ref Range Status   Creatinine, Ser  03/20/2024 1.50 (H)  0.61 - 1.24 mg/dL Final    ECG: Date: 91/91/7974 Time ECG obtained: 0805 AM Rate: 58 bpm Rhythm: Sinus bradycardia with PACs Axis (leads I and aVF): normal Intervals: PR 208 ms. QRS 102 ms. QTc 412 ms. ST segment and T wave changes: No evidence of acute T wave abnormalities or significant ST segment elevation or depression.  Evidence of a possible, age undetermined, prior infarct:  Yes; anterior Comparison: Similar to previous tracing obtained on 02/09/2024   IMAGING / PROCEDURES: DG SWALLOW FUNC OP MEDICARE SPEECH PATH performed on 05/18/2024 Pt reports improvement in swallow function since seeing ENT - I have been taking that steroid for 7 days now.  Pt benefited from frequent redirection to task d/t continued off-topic inappropriate comments to staff. As a result of this behavior and suspected poor health literacy, uncertain if pt was able to retain information.  Pt presents with adequate oropharyngeal abilities when consuming thin liquids, nectar thick liquids, puree, graham cracker with barium paste as well as whole barium tablet with thin liquids. At present time, patient's risk of aspiration appears reduced when consuming a regular diet with thin liquids, medicine whole with thin liquids.  Education was provided to pt and his wife on the results of this study as well as late effects of  radiation on swallow function.  Continue to recommend follow up with Outpatient ST services for education and prevention of future dysphagia related to radiation. Pt continues to decline services. Factors that may increase risk of adverse event in presence of aspiration [name] & [name] 2021): Factors that may increase risk of adverse event in presence of aspiration -- history of radiation.  NM PET (PSMA) SKULL TO MID THIGH performed on 04/17/2024 Dominant primary within the posterolateral left base to mid gland. A smaller focus of tracer affinity within the anterior paramidline left apex is suspicious for a second site of primary. Less likely, this could represent urethral physiologic uptake. No evidence of tracer avid soft tissue metastasis. Right second and less so sixth rib foci of tracer affinity without CT correlate, favored to be posttraumatic. An atypical distribution of osseous metastasis felt less likely. Similarly, diffuse mild activity within the T7 vertebral body is felt unlikely to represent metastatic disease. Suspect underlying hemangioma. Coronary artery atherosclerosis. Aortic atherosclerosis  Cholelithiasis.  Bilateral nephrolithiasis.  Pulmonary artery enlargement suggests pulmonary arterial hypertension.  Emphysema   TRANSTHORACIC ECHOCARDIOGRAM performed on 02/17/2024 Left ventricular ejection fraction, by estimation, is 55 to 60%. The left ventricle has normal function. The left ventricle has no regional wall motion abnormalities. Left ventricular diastolic parameters are consistent with Grade I diastolic dysfunction (impaired relaxation).  Right ventricular systolic function is normal. The right ventricular size is normal. Tricuspid regurgitation signal is inadequate for assessing PA pressure.  Left atrial size was severely dilated.  The mitral valve is normal in structure. Mild to moderate mitral valve regurgitation. No evidence of mitral stenosis.  The aortic valve is  normal in structure. There is mild calcification of the aortic valve. Aortic valve regurgitation is not visualized. Aortic valve sclerosis is present, with no evidence of aortic valve stenosis.  The inferior vena cava is normal in size with greater than 50% respiratory variability, suggesting right atrial pressure of 3 mmHg.   VAS US  EVAR DUPLEX performed on 07/06/2018 There is evidence of abnormal dilatation of the distal abdominal aorta.  The largest aortic measurement is 5.6 cm.  Patent endovascular aneurysm repair with no evidence of endoleak.  MYOCARDIAL PERFUSION IMAGING STUDY (LEXISCAN ) performed on 05/04/2018 Hypokinesis in the distal anterior and apical region,  EF estimated at 48%  Pharmacological myocardial perfusion imaging study with moderate to large sized region of ischemia of moderate intensity in the mid to distal anterior wall,  apical region and apical lateral wall No EKG changes concerning for ischemia at peak stress or in recovery. Prior cardiac catheterization 2016 showing chronically occluded LAD with right to left collaterals. Moderate risk scan   LEFT HEART CATHETERIZATION AND CORONARY ANGIOGRAPHY performed on 07/25/2015 Significant underlying three-vessel coronary artery disease with patent RCA stent. Occluded mid LAD proximal to a previously placed stent with right-to-left collaterals. The LAD appears to be diffusely diseased in multiple areas. LM lesion, 15% stenosed. Mid LAD to Dist LAD lesion, 20% stenosed. The lesion was previously treated with a stent (unknown type)greater than two years ago. Mid LAD lesion, 100% stenosed. Prox RCA lesion, 30% stenosed. Dist RCA lesion, 40% stenosed. Post Atrio lesion, 50% stenosed. Mid RCA lesion, 20% stenosed. The lesion was previously treated with a stent (unknown type). The left ventricular systolic function is normal. Low normal LV systolic function with an ejection fraction of 50-55%. Mildly elevated left ventricular  end-diastolic pressure (EDP between 17-20 mmHg) Recommendations: The LAD appears to be chronically occluded with TIMI 1 flow and reasonable right-to-left collaterals.  The LAD itself appears to be diffusely disease in the midsegment and might not be optimal for PCI. I recommend attempting optimizing medical therapy and increasing antianginal medications before considering LAD PCI.     IMPRESSION AND PLAN: Bobby Sandoval has been referred for pre-anesthesia review and clearance prior to him undergoing the planned anesthetic and procedural courses. Available labs, pertinent testing, and imaging results were personally reviewed by me in preparation for upcoming operative/procedural course. [facility] medical record has been updated following extensive record review and patient interview with PAT staff.   Patient did not show up to his scheduled PAT appointment today for his preoperative labs. He will need basic labs (CBC, BMP) prior to surgery.   He has been appropriately cleared by the cardiology service line with an overall ACCEPTABLE risk of patient experiencing significant perioperative cardiovascular complications. With that said, there are concerns related to patient's airway that are felt to warrant further assessment prior to patient undergoing this elective procedure.   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 Patient underwent weekly cisplatin  treatments and XRT, both of which completed in 10/2023. Secondary to inflammatory changes associated with his XRT treatments, patient initially with post-treatment dysphagia. In review of the last notes from medical oncology, dysphagia had improved. Follow up CT imaging performed in 03/2024 showed no discrete enhancing mass in the larynx. There were diffuse edematous post treatment changes in the BILATERAL vocal  cords and larynx. He followed up with otolaryngology and underwent repeat laryngoscopy on 05/11/2024. With direct visualization, patient was found to have significant soft tissue swelling of the BILATERAL arytenoids. Vocal cords could not be fully visualized. Patient was prescribed a course of  prednisone and cefdinir, which reportedly improved the degree of dysphagia that the patient was experiencing. Following treatment, patient underwent a MBSS with speech therapy which indicated that patient was at overall reduced risk of aspiration. Speech therapy recommended continued outpatient visit with their service, however patient declined.   Reviewed PMH, recent diagnostics, and case details with attending anesthesiologists Alm, MD  and Piscitello, MD). Discussed concerns related to airway. Per anesthesia, patient will need to be cleared by otolaryngology in order to proceed with the scheduled elective case. Communicated with Dr. Estelita office to make them aware that, in order to ensure a safe and effective anesthetic course, the anesthesia team is asking that the case be postponed pending reevaluation by the patient's otolaryngologist. Per the office, Dr. Tye requesting that case remain on the schedule and that his team reach out to otolaryngology office to obtain the necessary clearance for the procedure. We were advised that office would let us  know something ASAP. As of 1645 on 06/15/2024, PAT office had received no return communication. PAT secretary reached out to office staff once again, however we never heard anything back from the office. Perioperative/SDS leadership Newt, RN) aware.   Based on clinical review performed today (06/15/24), it is the recommendation of the anesthesia team that case be postponed until patient is seen in follow up consult by otolaryngology. This is a patient safety issue. Based on documentation from specialty provider, it sounds as if patient's airway will likely present  significant challenges for the anesthesia team when they attempt endotracheal intubation. Surgeon's office indicated that, after speaking with surgeon, they were going to attempt to obtain clearance. Office staff is aware that they will need to follow up with patient to discuss plans for procedure vs rescheduling based on the outcome of their efforts.   ADDENDUM 06/15/2024 at 1905 PM: Reached out to Dr. Tye, DO to confirm surgical POC and to determine that patient was aware. Per surgeon, interdisciplinary conversations have been had with ENT, anesthesia, and same day surgery. Patient is at too high of a risk to undergo GETA per ENT. Follow up discussions have been had with the patient by surgeon. Following discussions with the patient, an informed and collaborative decision was made to proceed with the hernia repair, however the procedure will be converted to an open inguinal hernia repair using neuraxial anesthesia. Patient in agreement with the plan as it stands at this point. Per Dr. Tye, everyone is aware and we are scheduled to proceed as planned tomorrow.   No further needs from the PAT department identified at this time.  Dorise Pereyra, MSN, APRN, FNP-C, CEN [facility]  Perioperative Services Nurse Practitioner Phone: [phone] Fax: [phone] 06/15/24 17:30 PM  NOTE: This note has been prepared using Dragon dictation software. Despite my best ability to proofread, there is always the potential that unintentional transcriptional errors may still occur from this process.

## 2024-06-16 ENCOUNTER — Other Ambulatory Visit: Payer: Self-pay

## 2024-06-16 ENCOUNTER — Encounter: Payer: Self-pay | Admitting: Oncology

## 2024-06-16 ENCOUNTER — Ambulatory Visit

## 2024-06-16 ENCOUNTER — Ambulatory Visit: Payer: Self-pay | Admitting: Urgent Care

## 2024-06-16 ENCOUNTER — Encounter: Payer: Self-pay | Admitting: Urgent Care

## 2024-06-16 ENCOUNTER — Ambulatory Visit
Admission: RE | Admit: 2024-06-16 | Discharge: 2024-06-16 | Disposition: A | Discharge status: Home / Self Care | Attending: Surgery | Admitting: Surgery

## 2024-06-16 ENCOUNTER — Encounter: Payer: Self-pay | Admitting: Surgery

## 2024-06-16 ENCOUNTER — Encounter
Admission: RE | Disposition: A | Discharge status: Home / Self Care | Payer: Self-pay | Source: Home / Self Care | Attending: Surgery

## 2024-06-16 DIAGNOSIS — I10 Essential (primary) hypertension: Secondary | ICD-10-CM | POA: Diagnosis not present

## 2024-06-16 DIAGNOSIS — F17210 Nicotine dependence, cigarettes, uncomplicated: Secondary | ICD-10-CM | POA: Insufficient documentation

## 2024-06-16 DIAGNOSIS — R1310 Dysphagia, unspecified: Secondary | ICD-10-CM | POA: Diagnosis not present

## 2024-06-16 DIAGNOSIS — Z955 Presence of coronary angioplasty implant and graft: Secondary | ICD-10-CM | POA: Diagnosis not present

## 2024-06-16 DIAGNOSIS — I252 Old myocardial infarction: Secondary | ICD-10-CM | POA: Insufficient documentation

## 2024-06-16 DIAGNOSIS — Z923 Personal history of irradiation: Secondary | ICD-10-CM | POA: Insufficient documentation

## 2024-06-16 DIAGNOSIS — J439 Emphysema, unspecified: Secondary | ICD-10-CM | POA: Insufficient documentation

## 2024-06-16 DIAGNOSIS — I2510 Atherosclerotic heart disease of native coronary artery without angina pectoris: Secondary | ICD-10-CM | POA: Diagnosis not present

## 2024-06-16 DIAGNOSIS — Z01812 Encounter for preprocedural laboratory examination: Secondary | ICD-10-CM

## 2024-06-16 DIAGNOSIS — E785 Hyperlipidemia, unspecified: Secondary | ICD-10-CM | POA: Insufficient documentation

## 2024-06-16 DIAGNOSIS — D176 Benign lipomatous neoplasm of spermatic cord: Secondary | ICD-10-CM | POA: Insufficient documentation

## 2024-06-16 DIAGNOSIS — Z85819 Personal history of malignant neoplasm of unspecified site of lip, oral cavity, and pharynx: Secondary | ICD-10-CM | POA: Insufficient documentation

## 2024-06-16 DIAGNOSIS — I5032 Chronic diastolic (congestive) heart failure: Secondary | ICD-10-CM | POA: Diagnosis not present

## 2024-06-16 DIAGNOSIS — K4090 Unilateral inguinal hernia, without obstruction or gangrene, not specified as recurrent: Secondary | ICD-10-CM | POA: Insufficient documentation

## 2024-06-16 DIAGNOSIS — I110 Hypertensive heart disease with heart failure: Secondary | ICD-10-CM | POA: Insufficient documentation

## 2024-06-16 DIAGNOSIS — F419 Anxiety disorder, unspecified: Secondary | ICD-10-CM | POA: Diagnosis not present

## 2024-06-16 HISTORY — DX: Male erectile dysfunction, unspecified: N52.9

## 2024-06-16 HISTORY — PX: INSERTION OF MESH: SHX5868

## 2024-06-16 HISTORY — DX: Cataract extraction status, left eye: Z98.41

## 2024-06-16 HISTORY — DX: Calculus of kidney: N20.0

## 2024-06-16 HISTORY — DX: Thrombocytopenia, unspecified: D69.6

## 2024-06-16 HISTORY — DX: Unilateral inguinal hernia, without obstruction or gangrene, not specified as recurrent: K40.90

## 2024-06-16 HISTORY — DX: Anemia, unspecified: D64.9

## 2024-06-16 HISTORY — DX: Diverticulosis of intestine, part unspecified, without perforation or abscess without bleeding: K57.90

## 2024-06-16 HISTORY — DX: Anxiety disorder, unspecified: F41.9

## 2024-06-16 HISTORY — DX: Calculus of gallbladder without cholecystitis without obstruction: K80.20

## 2024-06-16 HISTORY — DX: Long term (current) use of aspirin: Z79.82

## 2024-06-16 HISTORY — PX: INGUINAL HERNIA REPAIR: SHX194

## 2024-06-16 HISTORY — DX: Atherosclerosis of aorta: I70.0

## 2024-06-16 HISTORY — DX: Other intervertebral disc displacement, lumbar region: M51.26

## 2024-06-16 LAB — BASIC METABOLIC PANEL WITH GFR
Anion gap: 11 (ref 5–15)
BUN: 23 mg/dL (ref 8–23)
CO2: 28 mmol/L (ref 22–32)
Calcium: 9.3 mg/dL (ref 8.9–10.3)
Chloride: 93 mmol/L — ABNORMAL LOW (ref 98–111)
Creatinine, Ser: 1.56 mg/dL — ABNORMAL HIGH (ref 0.61–1.24)
GFR, Estimated: 45 mL/min — ABNORMAL LOW (ref >60)
Glucose, Bld: 106 mg/dL — ABNORMAL HIGH (ref 70–99)
Potassium: 3.8 mmol/L (ref 3.5–5.1)
Sodium: 132 mmol/L — ABNORMAL LOW (ref 135–145)

## 2024-06-16 LAB — CBC
HCT: 33.4 % — ABNORMAL LOW (ref 39.0–52.0)
Hemoglobin: 11.7 g/dL — ABNORMAL LOW (ref 13.0–17.0)
MCH: 31.5 pg (ref 26.0–34.0)
MCHC: 35 g/dL (ref 30.0–36.0)
MCV: 90 fL (ref 80.0–100.0)
Platelets: 179 K/uL (ref 150–400)
RBC: 3.71 MIL/uL — ABNORMAL LOW (ref 4.22–5.81)
RDW: 12.8 % (ref 11.5–15.5)
WBC: 3.5 K/uL — ABNORMAL LOW (ref 4.0–10.5)
nRBC: 0 % (ref 0.0–0.2)

## 2024-06-16 SURGERY — REPAIR, HERNIA, INGUINAL, ADULT
Anesthesia: Spinal | Laterality: Right

## 2024-06-16 MED ORDER — CELECOXIB 200 MG PO CAPS
ORAL_CAPSULE | ORAL | Status: AC
  Filled 2024-06-16: qty 1

## 2024-06-16 MED ORDER — HYDRALAZINE HCL 20 MG/ML IJ SOLN
10.0000 mg | Freq: Once | INTRAMUSCULAR | Status: AC
  Administered 2024-06-16: 10 mg via INTRAVENOUS

## 2024-06-16 MED ORDER — MIDAZOLAM HCL 5 MG/5ML IJ SOLN
INTRAMUSCULAR | Status: DC | PRN
  Administered 2024-06-16: 2 mg via INTRAVENOUS

## 2024-06-16 MED ORDER — PROPOFOL 10 MG/ML IV BOLUS
INTRAVENOUS | Status: DC | PRN
  Administered 2024-06-16 (×2): 20 mg via INTRAVENOUS

## 2024-06-16 MED ORDER — HYDRALAZINE HCL 20 MG/ML IJ SOLN
INTRAMUSCULAR | Status: AC
Start: 2024-06-16 — End: 2024-06-16
  Filled 2024-06-16: qty 1

## 2024-06-16 MED ORDER — LIDOCAINE HCL (CARDIAC) PF 100 MG/5ML IV SOSY
PREFILLED_SYRINGE | INTRAVENOUS | Status: DC | PRN
  Administered 2024-06-16: 40 mg via INTRAVENOUS

## 2024-06-16 MED ORDER — TRAMADOL HCL 50 MG PO TABS
50.0000 mg | ORAL_TABLET | Freq: Three times a day (TID) | ORAL | 0 refills | Status: AC | PRN
  Filled 2024-06-16: qty 6, 2d supply, fill #0

## 2024-06-16 MED ORDER — MIDAZOLAM HCL 2 MG/2ML IJ SOLN
INTRAMUSCULAR | Status: AC
  Filled 2024-06-16: qty 2

## 2024-06-16 MED ORDER — PROPOFOL 500 MG/50ML IV EMUL
INTRAVENOUS | Status: DC | PRN
  Administered 2024-06-16: 50 ug/kg/min via INTRAVENOUS

## 2024-06-16 MED ORDER — EPHEDRINE SULFATE-NACL 50-0.9 MG/10ML-% IV SOSY
PREFILLED_SYRINGE | INTRAVENOUS | Status: DC | PRN
  Administered 2024-06-16: 5 mg via INTRAVENOUS

## 2024-06-16 MED ORDER — FENTANYL CITRATE (PF) 100 MCG/2ML IJ SOLN
INTRAMUSCULAR | Status: AC
  Filled 2024-06-16: qty 2

## 2024-06-16 MED ORDER — FENTANYL CITRATE (PF) 100 MCG/2ML IJ SOLN: 25.0000 ug | INTRAMUSCULAR | Status: DC | PRN

## 2024-06-16 MED ORDER — BUPIVACAINE-EPINEPHRINE (PF) 0.5% -1:200000 IJ SOLN
INTRAMUSCULAR | Status: AC
  Filled 2024-06-16: qty 30

## 2024-06-16 MED ORDER — BUPIVACAINE IN DEXTROSE 0.75-8.25 % IT SOLN
INTRATHECAL | Status: DC | PRN
  Administered 2024-06-16: 1.7 mL via INTRATHECAL

## 2024-06-16 MED ORDER — CHLORHEXIDINE GLUCONATE 0.12 % MT SOLN
OROMUCOSAL | Status: AC
Start: 2024-06-16 — End: 2024-06-16
  Filled 2024-06-16: qty 15

## 2024-06-16 MED ORDER — BUPIVACAINE-EPINEPHRINE (PF) 0.5% -1:200000 IJ SOLN
INTRAMUSCULAR | Status: DC | PRN
  Administered 2024-06-16: 20 mL

## 2024-06-16 MED ORDER — BUPIVACAINE LIPOSOME 1.3 % IJ SUSP
INTRAMUSCULAR | Status: DC | PRN
  Administered 2024-06-16: 20 mL

## 2024-06-16 MED ORDER — DOCUSATE SODIUM 100 MG PO CAPS
100.0000 mg | ORAL_CAPSULE | Freq: Two times a day (BID) | ORAL | 0 refills | Status: AC | PRN
Start: 2024-06-16 — End: 2024-06-26
  Filled 2024-06-16: qty 20, 10d supply, fill #0

## 2024-06-16 MED ORDER — DEXMEDETOMIDINE HCL IN NACL 80 MCG/20ML IV SOLN
INTRAVENOUS | Status: DC | PRN
Start: 2024-06-16 — End: 2024-06-16
  Administered 2024-06-16: 12 ug via INTRAVENOUS

## 2024-06-16 MED ORDER — BUPIVACAINE LIPOSOME 1.3 % IJ SUSP
INTRAMUSCULAR | Status: AC
  Filled 2024-06-16: qty 20

## 2024-06-16 MED ORDER — PROPOFOL 1000 MG/100ML IV EMUL
INTRAVENOUS | Status: AC
  Filled 2024-06-16: qty 100

## 2024-06-16 MED ORDER — ACETAMINOPHEN 500 MG PO TABS
ORAL_TABLET | ORAL | Status: AC
  Filled 2024-06-16: qty 2

## 2024-06-16 SURGICAL SUPPLY — 31 items
BLADE CLIPPER SURG (BLADE) IMPLANT
BLADE SURG 15 STRL LF DISP TIS (BLADE) ×3 IMPLANT
CHLORAPREP W/TINT 26 (MISCELLANEOUS) IMPLANT
DERMABOND ADVANCED .7 DNX12 (GAUZE/BANDAGES/DRESSINGS) ×3 IMPLANT
DRAIN PENROSE 12X.25 LTX STRL (MISCELLANEOUS) ×3 IMPLANT
DRAPE LAPAROTOMY 100X77 ABD (DRAPES) ×3 IMPLANT
ELECTRODE REM PT RTRN 9FT ADLT (ELECTROSURGICAL) ×3 IMPLANT
GLOVE BIOGEL PI IND STRL 7.0 (GLOVE) ×3 IMPLANT
GLOVE SURG SYN 6.5 PF PI (GLOVE) ×9 IMPLANT
GOWN STRL REUS W/ TWL LRG LVL3 (GOWN DISPOSABLE) ×9 IMPLANT
LABEL OR SOLS (LABEL) ×3 IMPLANT
MANIFOLD NEPTUNE II (INSTRUMENTS) ×3 IMPLANT
MESH PARIETEX PROGRIP RIGHT (Mesh General) IMPLANT
NDL HYPO 22X1.5 SAFETY MO (MISCELLANEOUS) ×3 IMPLANT
NEEDLE HYPO 22X1.5 SAFETY MO (MISCELLANEOUS) ×2 IMPLANT
NS IRRIG 500ML POUR BTL (IV SOLUTION) ×3 IMPLANT
PACK BASIN MINOR ARMC (MISCELLANEOUS) ×3 IMPLANT
RETRACTOR WOUND ALXS 18CM SML (MISCELLANEOUS) IMPLANT
SUT ETHIBOND NAB MO 7 #0 18IN (SUTURE) ×3 IMPLANT
SUT SILK 3 0 12 30 (SUTURE) IMPLANT
SUT SILK 3 0 SH 30 (SUTURE) IMPLANT
SUT VIC AB 2-0 CT2 27 (SUTURE) ×3 IMPLANT
SUT VIC AB 3-0 SH 27X BRD (SUTURE) ×3 IMPLANT
SUTURE MNCRL 4-0 27XMF (SUTURE) ×3 IMPLANT
SYR 10ML LL (SYRINGE) ×3 IMPLANT
SYR 20ML LL LF (SYRINGE) ×3 IMPLANT
SYR BULB IRRIG 60ML STRL (SYRINGE) ×3 IMPLANT
TOWEL OR 17X26 4PK STRL BLUE (TOWEL DISPOSABLE) IMPLANT
TRAP FLUID SMOKE EVACUATOR (MISCELLANEOUS) ×3 IMPLANT
WATER STERILE IRR 1000ML POUR (IV SOLUTION) ×3 IMPLANT
WATER STERILE IRR 500ML POUR (IV SOLUTION) ×3 IMPLANT

## 2024-06-16 NOTE — Op Note (Signed)
 Preoperative diagnosis: Right, initial, reducible inguinal Hernia.  Postoperative diagnosis: Same  Procedure:  Open right, initial, reducible inguinal hernia repair with mesh  Anesthesia: General, LMA  Surgeon: Dr. Tye  Wound Classification: Clean  Specimen: none  Complications: None  Estimated Blood Loss: 10mL   Indications:  Patient is a 78 y.o. male developed a symptomatic right inguinal hernia. Repair was indicated to avoid complications of incarceration, obstruction and pain, and a prosthetic mesh repair was elected.  Findings: 1. Vas Deferens and cord structures identified and preserved 2. Progrip mesh used for repair 3. Adequate hemostasis achieved  Description of procedure: The patient was taken to the operating room. A time-out was completed verifying correct patient, procedure, site, positioning, and implant(s) and/or special equipment prior to beginning this procedure. The right groin was prepped and draped in the usual sterile fashion. An incision was marked in a natural skin crease and planned to end near the pubic tubercle.  The skin crease incision was made with a knife and deepened through Scarpa's and Camper's fascia with electrocautery until the aponeurosis of the external oblique was encountered. This was cleaned and the external ring was exposed. Hemostasis was achieved in the wound. An incision was made in the midportion of the external oblique aponeurosis in the direction of its fibers. The ilioinguinal nerve was identified and protected throughout the dissection. Flaps of the external oblique were developed cephalad and inferiorly.  The cord was identified. It was gently dissected free at the pubic tubercle and encircled with a Penrose drain. Attention was directed to the anteromedial aspect of the cord, where an indirect hernia sac was identified. The sac was carefully dissected free of the cord down to the level of the internal ring and reduced back into  abdominal cavity along with an adjacent cord lipoma. The vas and testicular vessels were identified and protected from harm.   Attention then turned to the floor of the canal, which was intact. The Progrip mesh was inserted and secured to the pubic tubercle using interrupted 0 ethibond sutures. Care was taken to assure that the mesh was placed flat against the floor and wrapped loosely around the cord structures.  1 additional 0 Ethibond placed along the lateral aspect of  to further secure the mesh in place.  Hemostasis was again checked. The Penrose drain was removed. Exparel  infused as an ilioinguinal block.  The external oblique aponeurosis was closed with a running suture of 2-0 Vicryl, taking care not to catch the ilioinguinal nerve in the suture line. Scarpa's fascia was closed with interrupted 3-0 Vicryl.  The deep dermal layer closed with interrupted 3-0 Vicryl.  The skin was closed with a subcuticular stitch of Monocryl 4-0. Dermabond was applied.  The testis was gently pulled down into its anatomic position in the scrotum.  The patient tolerated the procedure well and was taken to the postanesthesia care unit in stable condition. Sponge and instrument count correct at end of procedure.

## 2024-06-16 NOTE — Interval H&P Note (Signed)
 History and Physical Interval Note:  06/16/2024 9:29 AM  Bobby Sandoval  has presented today for surgery, with the diagnosis of K40.90 Non-recurrent unilateral inguinal hernia without obstruction or gangrene.  The various methods of treatment have been discussed with the patient and family. After consideration of risks, benefits and other options for treatment, the patient has consented to  Procedure(s): REPAIR, HERNIA, INGUINAL, ADULT (Right) as a surgical intervention.  Initially considered robotic assisted laparoscopic approach but ENT recommended avoiding airway intubation including LMA's as much as possible due to history of throat cancer.  We therefore switched the approach to an open approach under spinal anesthesia.     The patient's history has been reviewed, patient examined, no change in status, stable for surgery.  I have reviewed the patient's chart and labs.  Questions were answered to the patient's satisfaction.  Pending final anesthesia review, will hopefully be able to proceed as above.   Bobby Sandoval

## 2024-06-16 NOTE — Transfer of Care (Signed)
 Immediate Anesthesia Transfer of Care Note  Patient: Bobby Sandoval  Procedure(s) Performed: REPAIR, HERNIA, INGUINAL, ADULT (Right) INSERTION OF MESH  Patient Location: PACU  Anesthesia Type:MAC and Spinal  Level of Consciousness: awake, alert , and oriented  Airway & Oxygen Therapy: Patient Spontanous Breathing  Post-op Assessment: Report given to RN and Post -op Vital signs reviewed and stable  Post vital signs: Reviewed and stable  Last Vitals:  Vitals Value Taken Time  BP 95/44 06/16/24 11:05  Temp    Pulse 54 06/16/24 11:10  Resp 17 06/16/24 11:10  SpO2 97 % 06/16/24 11:10  Vitals shown include unfiled device data.  Last Pain:  Vitals:   06/16/24 0843  TempSrc: Temporal  PainSc: 0-No pain         Complications: No notable events documented.

## 2024-06-16 NOTE — Discharge Instructions (Addendum)
 Hernia repair, Care After This sheet gives you information about how to care for yourself after your procedure. Your health care provider may also give you more specific instructions. If you have problems or questions, contact your health care provider. What can I expect after the procedure? After your procedure, it is common to have the following: Pain in your abdomen, especially in the incision areas. You will be given medicine to control the pain. Tiredness. This is a normal part of the recovery process. Your energy level will return to normal over the next several weeks. Changes in your bowel movements, such as constipation or needing to go more often. Talk with your health care provider about how to manage this. Follow these instructions at home: Medicines  tylenol  as needed for discomfort.     Use narcotics, if prescribed, only when tylenol  is not enough to control pain.  325-650mg  every 8hrs to max of 3000mg /24hrs (including the 325mg  in every norco dose) for the tylenol .   PLEASE RECORD NUMBER OF PILLS TAKEN UNTIL NEXT FOLLOW UP APPT.  THIS WILL HELP DETERMINE HOW READY YOU ARE TO BE RELEASED FROM ANY ACTIVITY RESTRICTIONS Do not drive or use heavy machinery while taking prescription pain medicine. Do not drink alcohol  while taking prescription pain medicine.  Incision care    Follow instructions from your health care provider about how to take care of your incision areas. Make sure you: Keep your incisions clean and dry. Wash your hands with soap and water  before and after applying medicine to the areas, and before and after changing your bandage (dressing). If soap and water  are not available, use hand sanitizer. Change your dressing as told by your health care provider. Leave stitches (sutures), skin glue, or adhesive strips in place. These skin closures may need to stay in place for 2 weeks or longer. If adhesive strip edges start to loosen and curl up, you may trim the loose  edges. Do not remove adhesive strips completely unless your health care provider tells you to do that. Do not wear tight clothing over the incisions. Tight clothing may rub and irritate the incision areas, which may cause the incisions to open. Do not take baths, swim, or use a hot tub until your health care provider approves. OK TO SHOWER IN 24HRS.   Check your incision area every day for signs of infection. Check for: More redness, swelling, or pain. More fluid or blood. Warmth. Pus or a bad smell. Activity Avoid lifting anything that is heavier than 10 lb (4.5 kg) for 2 weeks or until your health care provider says it is okay. No pushing/pulling greater than 30lbs You may resume normal activities as told by your health care provider. Ask your health care provider what activities are safe for you. Take rest breaks during the day as needed. Eating and drinking Follow instructions from your health care provider about what you can eat after surgery. To prevent or treat constipation while you are taking prescription pain medicine, your health care provider may recommend that you: Drink enough fluid to keep your urine clear or pale yellow. Take over-the-counter or prescription medicines. Eat foods that are high in fiber, such as fresh fruits and vegetables, whole grains, and beans. Limit foods that are high in fat and processed sugars, such as fried and sweet foods. General instructions Ask your health care provider when you will need an appointment to get your sutures or staples removed. Keep all follow-up visits as told by your health  care provider. This is important. Contact a health care provider if: You have more redness, swelling, or pain around your incisions. You have more fluid or blood coming from the incisions. Your incisions feel warm to the touch. You have pus or a bad smell coming from your incisions or your dressing.  Information for Discharge Teaching: EXPAREL  (bupivacaine   liposome injectable suspension)   Pain relief is important to your recovery. The goal is to control your pain so you can move easier and return to your normal activities as soon as possible after your procedure. Your physician may use several types of medicines to manage pain, swelling, and more.  Your surgeon or anesthesiologist gave you EXPAREL (bupivacaine ) to help control your pain after surgery.  EXPAREL  is a local anesthetic designed to release slowly over an extended period of time to provide pain relief by numbing the tissue around the surgical site. EXPAREL  is designed to release pain medication over time and can control pain for up to 72 hours. Depending on how you respond to EXPAREL , you may require less pain medication during your recovery. EXPAREL  can help reduce or eliminate the need for opioids during the first few days after surgery when pain relief is needed the most. EXPAREL  is not an opioid and is not addictive. It does not cause sleepiness or sedation.   Important! A teal colored band has been placed on your arm with the date, time and amount of EXPAREL  you have received. Please leave this armband in place for the full 96 hours following administration, and then you may remove the band. If you return to the hospital for any reason within 96 hours following the administration of EXPAREL , the armband provides important information that your health care providers to know, and alerts them that you have received this anesthetic.    Possible side effects of EXPAREL : Temporary loss of sensation or ability to move in the area where medication was injected. Nausea, vomiting, constipation Rarely, numbness and tingling in your mouth or lips, lightheadedness, or anxiety may occur. Call your doctor right away if you think you may be experiencing any of these sensations, or if you have other questions regarding possible side effects.  Follow all other discharge instructions given to you by  your surgeon or nurse. Eat a healthy diet and drink plenty of water  or other fluids. You have a fever. You have an incision that breaks open (edges not staying together) after sutures or staples have been removed. You develop a rash. You have chest pain or difficulty breathing. You have pain or swelling in your legs. You feel light-headed or you faint. Your abdomen swells (becomes distended). You have nausea or vomiting. You have blood in your stool (feces). This information is not intended to replace advice given to you by your health care provider. Make sure you discuss any questions you have with your health care provider. Document Released: 04/10/2005 Document Revised: 06/10/2018 Document Reviewed: 06/22/2016 Elsevier Interactive Patient Education  2019 [employer].

## 2024-06-16 NOTE — Anesthesia Preprocedure Evaluation (Signed)
 Anesthesia Evaluation  Patient identified by MRN, date of birth, ID band Patient awake    Reviewed: Allergy & Precautions, NPO status , Patient's Chart, lab work & pertinent test results  History of Anesthesia Complications Negative for: history of anesthetic complications  Airway Mallampati: II  TM Distance: >3 FB Neck ROM: Full   Comment: Hoarse voice at baseline. No airway obstruction or masses on CT neck Dental  (+) Edentulous Upper, Edentulous Lower   Pulmonary neg shortness of breath, neg sleep apnea, COPD, neg recent URI, Patient did not abstain from smoking., former smoker 11-08-19 1. No acute intrathoracic findings. 2. Advanced emphysema (ICD10-J43.9). 3. New 6 mm pulmonary nodule within the right upper lobe. Non-contrast chest CT at 6-12 months is recommended. If the nodule is stable at time of repeat CT, then future CT at 18-24 months (from today's scan) is considered optional for low-risk patients, but is recommended for high-risk patients. This recommendation follows the consensus statement: Guidelines for Management of Incidental Pulmonary Nodules Detected on CT Images: From the Fleischner Society 2017; Radiology 2017; 284:228-243. 4. Cholelithiasis. 5. Nonobstructing left renal stone. 6. Aortic and coronary artery atherosclerosis (ICD10-I70.0). 7. Partially imaged abdominal aortic aneurysm status post stent graft placement.  03-27-22 office visit notes Chronic exertional dyspnea  reports drinking 12 beers a day and 2 packs/cigarettes/day Wyn Raddle, Jackee Shove, NP    + decreased breath sounds      Cardiovascular Exercise Tolerance: Good METShypertension, (-) angina + CAD, + Past MI and + Cardiac Stents  (-) dysrhythmias (-) Valvular Problems/Murmurs Rhythm:Regular Rate:Normal - Systolic murmurs 89-79-8 LM lesion, 15% stenosed.  Mid LAD to Dist LAD lesion, 20% stenosed. The lesion was previously treated with a  stent (unknown type)greater than two years ago.  Mid LAD lesion, 100% stenosed.  Prox RCA lesion, 30% stenosed.  Dist RCA lesion, 40% stenosed.  Post Atrio lesion, 50% stenosed.  Mid RCA lesion, 20% stenosed. The lesion was previously treated with a stent (unknown type).  The left ventricular systolic function is normal.   1. Significant underlying three-vessel coronary artery disease with patent RCA stent. Occluded mid LAD proximal to a previously placed stent with right-to-left collaterals. The LAD appears to be diffusely diseased in multiple areas. 2. Low normal LV systolic function with an ejection fraction of 50-55%. 3. Mildly elevated left ventricular end-diastolic pressure (EDP between 17-20 mmHg)   Recommendations: The LAD appears to be chronically occluded with TIMI 1 flow and reasonable right-to-left collaterals. The LAD itself appears to be diffusely disease in the midsegment and might not be optimal for PCI. I recommend attempting optimizing medical therapy and increasing antianginal medications before considering LAD PCI.   01-27-18 Left ventricle: The cavity size was normal. There was mild    concentric hypertrophy. Systolic function was normal. The    estimated ejection fraction was in the range of 60% to 65%. Wall    motion was normal; there were no regional wall motion    abnormalities. Doppler parameters are consistent with abnormal    left ventricular relaxation (grade 1 diastolic dysfunction).  - Mitral valve: There was mild regurgitation.  - Left atrium: The atrium was mildly dilated.  - Right ventricle: Systolic function was normal.  - Pulmonary arteries: Systolic pressure was within the normal    range.   extensive coronary artery disease history with BMS stenting to RCA in 2007 and DES stenting to LAD in 2008.  His most recent LHC was completed 07/2015 which revealed occluded mid LAD stent  with collaterals left and right with patent RCA stent.  This was  managed with medical therapy and patient denied any recent chest pain.  Most recent 2D echo was completed 01/2018 with EF of 60-65%, mild concentric LVH, grade 1 DD, with mild MV regurgitation.  He underwent nuclear stress test which revealed abnormal region of ischemia that was consistent with previously occluded LAD.  In 2019 patient underwent endovascular repair of large AAA that was successfully treated with stent graft.     Neuro/Psych  PSYCHIATRIC DISORDERS Anxiety     negative neurological ROS     GI/Hepatic ,neg GERD  ,,(+)     substance abuse  alcohol  useAt least one six pack a day   Endo/Other  neg diabetes    Renal/GU negative Renal ROS     Musculoskeletal   Abdominal   Peds  Hematology   Anesthesia Other Findings Past Medical History: No date: AAA (abdominal aortic aneurysm) (HCC)     Comment:  a. 2012 3.4 cm; b. 07/2017 Abd u/s: Dist abd Ao dil up               to 4.8cm; c. 03/2018 CT Abd: infrarenal AAA 5.3 x 5.7cm;               d. 06/2018 EVAR (28mm prox/44mm dist, 18cm length Gore               Excluder Endoprosthesis). No date: Arthritis No date: CAD (coronary artery disease)     Comment:  a. 2007 Inf MI w/ BMS to RCA x 2; b. 2008 PCI/DES to mid              LAD @ [facility]; c. 07/2015 Cath: LAD 100 prox to prev placed               stent w/ R->L collats, RCA stents patent. EF 50-55%. Med               Rx; d. 04/2018 MV: EF 48%, mid-dist ant/apical/apical-lat               ischemia->med rx. No date: COPD (chronic obstructive pulmonary disease) (HCC) No date: Diastolic dysfunction     Comment:  a. 02/2014 Echo: EF 55-65%. Gr1 DD, no rwma, mildly dil               LA, nl RV fxn; b. 01/2018 Echo: EF 60-65%, Gr1 DD, mild               MR, mildly dil LA. nl RV fxn. Nl PASP. No date: ETOH abuse No date: Grade I diastolic dysfunction No date: Hepatic steatosis     Comment:  a. 03/2018 CT Abd: hepatic steatosis and possible early               cirrhotic morphology of  liver. No date: History of heart attack No date: History of kidney stones No date: Hyperlipidemia No date: Hypertension No date: MI (myocardial infarction) (HCC) No date: Mild mitral regurgitation by prior echocardiogram No date: Obesity No date: Panic disorder No date: Panic disorder No date: Status post AAA (abdominal aortic aneurysm) repair No date: Tobacco abuse  Reproductive/Obstetrics                              Anesthesia Physical Anesthesia Plan  ASA: 3  Anesthesia Plan: Spinal   Post-op Pain Management: Ofirmev  IV (intra-op)*   Induction: Intravenous  PONV Risk Score and Plan: 2 and Propofol  infusion and TIVA  Airway Management Planned: Natural Airway and Simple Face Mask  Additional Equipment: None  Intra-op Plan:   Post-operative Plan:   Informed Consent: I have reviewed the patients History and Physical, chart, labs and discussed the procedure including the risks, benefits and alternatives for the proposed anesthesia with the patient or authorized representative who has indicated his/her understanding and acceptance.     Dental advisory given  Plan Discussed with: CRNA and Surgeon  Anesthesia Plan Comments: (Discussed risks of anesthesia with patient, including PONV, sore throat, lip/dental/eye damage. Rare risks discussed as well, such as cardiorespiratory and neurological sequelae, and allergic reactions. Discussed the role of CRNA in patient's perioperative care. Patient understands. Patient counseled on benefits of smoking cessation, and increased perioperative risks associated with continued smoking. )        Anesthesia Quick Evaluation

## 2024-06-19 ENCOUNTER — Ambulatory Visit

## 2024-06-19 ENCOUNTER — Encounter: Payer: Self-pay | Admitting: Surgery

## 2024-06-20 ENCOUNTER — Ambulatory Visit

## 2024-06-21 ENCOUNTER — Ambulatory Visit

## 2024-06-22 ENCOUNTER — Ambulatory Visit

## 2024-06-22 NOTE — Anesthesia Postprocedure Evaluation (Signed)
 Anesthesia Post Note  Patient: Bobby Sandoval  Procedure(s) Performed: REPAIR, HERNIA, INGUINAL, ADULT (Right) INSERTION OF MESH  Patient location during evaluation: PACU Anesthesia Type: Spinal Level of consciousness: awake and alert Pain management: pain level controlled Vital Signs Assessment: post-procedure vital signs reviewed and stable Respiratory status: spontaneous breathing, nonlabored ventilation, respiratory function stable and patient connected to nasal cannula oxygen Cardiovascular status: blood pressure returned to baseline and stable Postop Assessment: no apparent nausea or vomiting Anesthetic complications: no   No notable events documented.   Last Vitals:  Vitals:   06/16/24 1546 06/16/24 1621  BP: (!) 184/61 (!) 168/64  Pulse: 65 81  Resp: 16 16  Temp: (!) 36.1 C   SpO2: 100% 100%    Last Pain:  Vitals:   06/16/24 1621  TempSrc:   PainSc: 0-No pain                 Prentice Murphy

## 2024-06-23 ENCOUNTER — Ambulatory Visit

## 2024-06-26 ENCOUNTER — Ambulatory Visit

## 2024-06-27 ENCOUNTER — Ambulatory Visit

## 2024-06-28 ENCOUNTER — Ambulatory Visit

## 2024-06-29 ENCOUNTER — Ambulatory Visit

## 2024-06-30 ENCOUNTER — Ambulatory Visit

## 2024-07-03 ENCOUNTER — Ambulatory Visit

## 2024-07-03 NOTE — Progress Notes (Signed)
 Subjective:   CC: Non-recurrent unilateral inguinal hernia without obstruction or gangrene [K40.90] POSTOP  HPI: History of Present Illness No issues.   Current Medications: has a current medication list which includes the following prescription(s): alprazolam , atorvastatin , carvedilol , enalapril , nitroglycerin , torsemide , ventolin  hfa, aspirin , and sildenafil.  Allergies:  No Known Allergies  ROS: Pertinent positives and negatives noted in HPI    Objective:     BP (!) 163/81   Pulse 61   Ht 185.4 cm (6' 1)   Wt 79.4 kg (175 lb)   BMI 23.09 kg/m   Constitutional :  Alert, no distress, cooperative  Gastrointestinal: soft, non-tender; bowel sounds normal; no masses,  no organomegaly.   Musculoskeletal: Steady gait and movement  Skin: Cool and moist, incision clean, dry, intact.  No erythema, induration or drainage to indicate infection.    Psychiatric: Normal affect, non-agitated, not confused       LABS:  N/A   RADS: N/A  Assessment:      Non-recurrent unilateral inguinal hernia without obstruction or gangrene [K40.90] S/p open repair, right  Plan:     1. Healing well.  No issues.  Healing ridge will flatten and resolve on its own.  OK to proceed with additional treatments for prostate.  All questions addressed. F/u prn.  labs/images/medications/previous chart entries reviewed personally and relevant changes/updates noted above.

## 2024-07-04 ENCOUNTER — Ambulatory Visit

## 2024-07-05 ENCOUNTER — Ambulatory Visit

## 2024-07-06 ENCOUNTER — Ambulatory Visit

## 2024-07-07 ENCOUNTER — Ambulatory Visit

## 2024-07-10 ENCOUNTER — Ambulatory Visit

## 2024-07-11 ENCOUNTER — Ambulatory Visit

## 2024-07-12 ENCOUNTER — Ambulatory Visit

## 2024-07-13 ENCOUNTER — Ambulatory Visit

## 2024-07-14 ENCOUNTER — Ambulatory Visit

## 2024-07-17 ENCOUNTER — Ambulatory Visit

## 2024-07-18 ENCOUNTER — Ambulatory Visit

## 2024-07-18 DIAGNOSIS — C329 Malignant neoplasm of larynx, unspecified: Secondary | ICD-10-CM | POA: Diagnosis not present

## 2024-07-19 ENCOUNTER — Encounter: Payer: Self-pay | Admitting: Radiation Oncology

## 2024-07-19 ENCOUNTER — Ambulatory Visit

## 2024-07-19 ENCOUNTER — Ambulatory Visit
Admission: RE | Admit: 2024-07-19 | Discharge: 2024-07-19 | Disposition: A | Discharge status: Home / Self Care | Source: Ambulatory Visit | Attending: Radiation Oncology | Admitting: Radiation Oncology

## 2024-07-19 VITALS — BP 190/74 | HR 56 | Temp 98.0°F | Resp 16 | Wt 175.0 lb

## 2024-07-19 DIAGNOSIS — C61 Malignant neoplasm of prostate: Secondary | ICD-10-CM | POA: Insufficient documentation

## 2024-07-19 DIAGNOSIS — Z191 Hormone sensitive malignancy status: Secondary | ICD-10-CM | POA: Diagnosis not present

## 2024-07-19 NOTE — Progress Notes (Signed)
 Radiation Oncology Follow up Note  Name: Bobby Sandoval   Date:   07/19/2024 MRN:  [id number] DOB: 17-Mar-1946    This 78 y.o. 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  He had recently undergone herniorrhaphy repair  REFERRING PROVIDER: Rudolpho Norleen BIRCH, MD  HPI: Patient is.  A 78 year old male who originally consulted back in July when he presented with a stage IIc Gleason 8 (4+4) adenocarcinoma the prostate.  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  He had a unilateral inguinal herniorrhaphy repair.  According to surgeon he is cleared out to proceed with radiation therapy.  He continues to have some urinary frequency and urgency nocturia x 3-4.  He is having no bowel complaints.  COMPLICATIONS OF TREATMENT: none  FOLLOW UP COMPLIANCE: keeps appointments   PHYSICAL EXAM:  BP (!) 190/74 Comment: Patient monitors at home and is WNL  Pulse (!) 56   Temp 98 F (36.7 C)   Resp 16   Wt 175 lb (79.4 kg)   BMI 23.09 kg/m  Well-developed well-nourished patient in NAD. HEENT reveals PERLA, EOMI, discs not visualized.  Oral cavity is clear. No oral mucosal lesions are identified. Neck is clear without evidence of cervical or supraclavicular adenopathy. Lungs are clear to A&P. Cardiac examination is essentially unremarkable with regular rate and rhythm without murmur rub or thrill. Abdomen is benign with no organomegaly or masses noted. Motor sensory and DTR levels are equal and symmetric in the upper and lower extremities. Cranial nerves II through XII are grossly intact. Proprioception is intact. No peripheral adenopathy or edema is identified. No motor or sensory levels are noted. Crude visual fields are within normal range.  RADIOLOGY RESULTS: No current films to review  PLAN: At this time we  will proceed with radiation therapy.  Patient has had markers placed and been started on Eligard .  Will plan on delivering 27 Elnor to his prostate up dosing to 83 Gray to the area of avid uptake on his PSMA PET scan.  Again risks and benefits of treatment including increased lower lower tract symptoms diarrhea fatigue alteration of blood counts all were discussed in detail with the patient.  I have personally set up and ordered CT simulation.  Patient comprehends my recommendations well.  I would like to take this opportunity to thank you for allowing me to participate in the care of your patient.SABRA Marcey Penton, MD

## 2024-07-20 ENCOUNTER — Ambulatory Visit

## 2024-07-21 ENCOUNTER — Ambulatory Visit

## 2024-07-24 ENCOUNTER — Ambulatory Visit

## 2024-07-25 ENCOUNTER — Other Ambulatory Visit: Payer: Self-pay | Admitting: *Deleted

## 2024-07-25 ENCOUNTER — Ambulatory Visit

## 2024-07-25 ENCOUNTER — Ambulatory Visit
Admission: RE | Admit: 2024-07-25 | Discharge: 2024-07-25 | Disposition: A | Source: Ambulatory Visit | Attending: Radiation Oncology | Admitting: Radiation Oncology

## 2024-07-25 ENCOUNTER — Other Ambulatory Visit: Payer: Self-pay

## 2024-07-25 DIAGNOSIS — C61 Malignant neoplasm of prostate: Secondary | ICD-10-CM | POA: Diagnosis not present

## 2024-07-26 ENCOUNTER — Ambulatory Visit

## 2024-07-27 ENCOUNTER — Ambulatory Visit

## 2024-07-28 ENCOUNTER — Ambulatory Visit

## 2024-07-31 ENCOUNTER — Ambulatory Visit

## 2024-07-31 DIAGNOSIS — C61 Malignant neoplasm of prostate: Secondary | ICD-10-CM | POA: Diagnosis not present

## 2024-08-01 ENCOUNTER — Ambulatory Visit

## 2024-08-02 ENCOUNTER — Ambulatory Visit

## 2024-08-02 ENCOUNTER — Ambulatory Visit
Admission: RE | Admit: 2024-08-02 | Discharge: 2024-08-02 | Disposition: A | Source: Ambulatory Visit | Attending: Radiation Oncology | Admitting: Radiation Oncology

## 2024-08-02 ENCOUNTER — Other Ambulatory Visit: Payer: Self-pay | Admitting: *Deleted

## 2024-08-02 DIAGNOSIS — C320 Malignant neoplasm of glottis: Secondary | ICD-10-CM

## 2024-08-03 ENCOUNTER — Ambulatory Visit
Admission: RE | Admit: 2024-08-03 | Discharge: 2024-08-03 | Disposition: A | Source: Ambulatory Visit | Attending: Radiation Oncology | Admitting: Radiation Oncology

## 2024-08-03 ENCOUNTER — Inpatient Hospital Stay: Attending: Hospice and Palliative Medicine

## 2024-08-03 ENCOUNTER — Encounter: Payer: Self-pay | Admitting: Oncology

## 2024-08-03 ENCOUNTER — Other Ambulatory Visit: Payer: Self-pay

## 2024-08-03 ENCOUNTER — Inpatient Hospital Stay: Admitting: Oncology

## 2024-08-03 ENCOUNTER — Ambulatory Visit

## 2024-08-03 VITALS — BP 158/70 | HR 73 | Temp 97.8°F | Resp 18 | Ht 73.0 in | Wt 176.0 lb

## 2024-08-03 DIAGNOSIS — I10 Essential (primary) hypertension: Secondary | ICD-10-CM | POA: Diagnosis not present

## 2024-08-03 DIAGNOSIS — Z8521 Personal history of malignant neoplasm of larynx: Secondary | ICD-10-CM | POA: Diagnosis not present

## 2024-08-03 DIAGNOSIS — C61 Malignant neoplasm of prostate: Secondary | ICD-10-CM | POA: Insufficient documentation

## 2024-08-03 DIAGNOSIS — F410 Panic disorder [episodic paroxysmal anxiety] without agoraphobia: Secondary | ICD-10-CM | POA: Insufficient documentation

## 2024-08-03 DIAGNOSIS — C320 Malignant neoplasm of glottis: Secondary | ICD-10-CM

## 2024-08-03 DIAGNOSIS — E871 Hypo-osmolality and hyponatremia: Secondary | ICD-10-CM | POA: Insufficient documentation

## 2024-08-03 LAB — CBC WITH DIFFERENTIAL/PLATELET
Abs Immature Granulocytes: 0.02 K/uL (ref 0.00–0.07)
Basophils Absolute: 0 K/uL (ref 0.0–0.1)
Basophils Relative: 1 %
Eosinophils Absolute: 0.1 K/uL (ref 0.0–0.5)
Eosinophils Relative: 2 %
HCT: 33.1 % — ABNORMAL LOW (ref 39.0–52.0)
Hemoglobin: 11.5 g/dL — ABNORMAL LOW (ref 13.0–17.0)
Immature Granulocytes: 1 %
Lymphocytes Relative: 33 %
Lymphs Abs: 1.5 K/uL (ref 0.7–4.0)
MCH: 31.9 pg (ref 26.0–34.0)
MCHC: 34.7 g/dL (ref 30.0–36.0)
MCV: 91.7 fL (ref 80.0–100.0)
Monocytes Absolute: 0.5 K/uL (ref 0.1–1.0)
Monocytes Relative: 10 %
Neutro Abs: 2.4 K/uL (ref 1.7–7.7)
Neutrophils Relative %: 53 %
Platelets: 161 K/uL (ref 150–400)
RBC: 3.61 MIL/uL — ABNORMAL LOW (ref 4.22–5.81)
RDW: 13.2 % (ref 11.5–15.5)
WBC: 4.4 K/uL (ref 4.0–10.5)
nRBC: 0 % (ref 0.0–0.2)

## 2024-08-03 LAB — RAD ONC ARIA SESSION SUMMARY
Course Elapsed Days: 0
Plan Fractions Treated to Date: 1
Plan Prescribed Dose Per Fraction: 2 Gy
Plan Total Fractions Prescribed: 40
Plan Total Prescribed Dose: 80 Gy
Reference Point Dosage Given to Date: 2 Gy
Reference Point Session Dosage Given: 2 Gy
Session Number: 1

## 2024-08-03 LAB — BASIC METABOLIC PANEL - CANCER CENTER ONLY
Anion gap: 10 (ref 5–15)
BUN: 17 mg/dL (ref 8–23)
CO2: 23 mmol/L (ref 22–32)
Calcium: 9.2 mg/dL (ref 8.9–10.3)
Chloride: 92 mmol/L — ABNORMAL LOW (ref 98–111)
Creatinine: 1.22 mg/dL (ref 0.61–1.24)
GFR, Estimated: >60 mL/min (ref >60)
Glucose, Bld: 98 mg/dL (ref 70–99)
Potassium: 4.2 mmol/L (ref 3.5–5.1)
Sodium: 125 mmol/L — ABNORMAL LOW (ref 135–145)

## 2024-08-03 LAB — MAGNESIUM: Magnesium: 2 mg/dL (ref 1.7–2.4)

## 2024-08-03 MED ORDER — TRIAMCINOLONE ACETONIDE 0.1 % EX CREA: 1.0000 | TOPICAL_CREAM | Freq: Two times a day (BID) | CUTANEOUS | 2 refills | Status: AC

## 2024-08-03 NOTE — Progress Notes (Unsigned)
 Patient has started to another blood pressure medication, can't recall name of by Dr. Alm Louder, PCP  He would like for doctor finnegan to prescribe him some Kenalog  cream for his ankles.

## 2024-08-03 NOTE — Progress Notes (Unsigned)
 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  INTERVAL HISTORY: Patient returns to clinic today for repeat laboratory work, further evaluation, and discussion of his PET scan results.  He currently feels well and is asymptomatic.  He denies any dysphagia or difficulty swallowing.  His appetite has significantly improved.  He continues to have occasional panic attacks.  He has no neurologic complaints.  He denies any recent fevers or illnesses.  He has a good appetite and denies weight loss.  He has no chest pain, cough, shortness of breath, or hemoptysis.  He denies any nausea, vomiting, constipation, or diarrhea.  He has no urinary complaints.  Patient offers no further specific complaints today.  REVIEW OF SYSTEMS:   Review of Systems  Constitutional: Negative.  Negative for fever, malaise/fatigue and weight loss.  HENT:  Negative for sore throat.   Respiratory: Negative.  Negative for cough, hemoptysis and shortness of breath.   Cardiovascular: Negative.  Negative for chest pain and leg swelling.  Gastrointestinal: Negative.  Negative for abdominal pain.  Genitourinary: Negative.  Negative for dysuria.  Musculoskeletal: Negative.  Negative for back pain.  Skin: Negative.  Negative for rash.  Neurological: Negative.  Negative for dizziness, focal weakness, weakness and headaches.  Psychiatric/Behavioral: Negative.  The patient is not nervous/anxious.     As per HPI. Otherwise, a complete review of systems is negative.  PAST MEDICAL HISTORY: Past Medical  History:  Diagnosis Date   AAA (abdominal aortic aneurysm) (s/p EVAR)    a. 2012 3.4 cm; b. 07/2017 Abd u/s: Dist abd Ao dil up to 4.8cm; c. 03/2018 CT Abd: infrarenal AAA 5.3 x 5.7cm; d. 06/2018 EVAR (28mm prox/35mm dist, 18cm length Gore Excluder Endoprosthesis).   Anemia    Anxiety    a.) on BZO PRN (alprazolam )   Aortic atherosclerosis    Arthritis    CAD (coronary artery disease)    a.) Inf MI 2007 -- PCI of the RCA (overlapping 3.5 x 16 mm and 3.5 x 24 mm Liberte BMS) ; b.) PCI 2008 --> mLAD (2.5 x 28 mm Cypher DES) @ [facility]; c. 07/2015 Cath: LAD 100 prox to prev placed stent w/ R->L collats, RCA stents patent. EF 50-55%. Med Rx; d. 04/2018 MV: EF 48%, mid-dist ant/apical/apical-lat ischemia->med rx.   Cataracts, bilateral    Cellulitis, unspecified cellulitis site 05/2024   Cholelithiasis    Chronic heart failure with preserved ejection fraction (HFpEF) (HCC)    a. 02/2014 Echo: EF 55-65%. Gr1 DD; b. 01/2018 Echo: EF 60-65%, Gr1 DD, mild MR, mildly dil LA. nl RV fxn. Nl PASP; c. 02/2024 Echo: EF 55-60%, no rwma, GrI DD, nl RV fxn, sev dil LA, mild-mod MR. AoV sclerosis.   COPD (chronic obstructive pulmonary disease) (HCC)    Diverticulosis    Erectile dysfunction    a.) on PDE5i (sildenafil)   ETOH abuse    Hepatic steatosis    a. 03/2018 CT Abd: hepatic steatosis and possible early cirrhotic morphology of liver.   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 (HCC) 07/20/2023   a.) Bx (+) for invasive moderately differentiated stage III (cT3 N0 M0) SCC; Tx'd with weekly cisplatin  (completed 10/20/2023) + XRT (completed 10/27/2023)    Status post bilateral cataract extraction    Thrombocytopenia    Tobacco abuse     PAST SURGICAL HISTORY: Past Surgical History:  Procedure Laterality Date   25 GAUGE PARS PLANA VITRECTOMY WITH 20 GAUGE MVR PORT FOR MACULAR HOLE Right 01/14/2021   Procedure: 25 GAUGE PARS PLANA VITRECTOMY WITH 20 GAUGE MVR PORT FOR MACULAR HOLE;  Surgeon: Alvia Norleen BIRCH, MD;  Location: [facility] OR;  Service: Ophthalmology;  Laterality: Right;   AIR/FLUID EXCHANGE Right 01/14/2021   Procedure: AIR/FLUID EXCHANGE;  Surgeon: Alvia Norleen BIRCH, MD;  Location: [facility] OR;  Service: Ophthalmology;  Laterality: Right;   CARDIAC CATHETERIZATION  08/2006   x1 stent [facility]. Mid RCA: 3.5 x 16 mm overlap with 3.5 x 24 mm Liberte bare-metal stents   CARDIAC CATHETERIZATION  10/2006   x1 stent [facility]: Mid LAD: 2.5 x 28 mm Cypher drug-eluting   CARDIAC CATHETERIZATION N/A 07/25/2015   Procedure: Left Heart Cath and Coronary Angiography;  Surgeon: Deatrice DELENA Cage, MD;  Location: [facility] INVASIVE CV LAB;  Service: Cardiovascular;  Laterality: N/A;   CATARACT EXTRACTION Bilateral    COLONOSCOPY     ENDOVASCULAR STENT GRAFT (AAA) N/A 06/08/2018   Procedure: ENDOVASCULAR REPAIR/STENT GRAFT;  Surgeon: Marea Selinda RAMAN, MD;  Location: [facility] INVASIVE CV LAB;  Service: Cardiovascular;  Laterality: N/A;   GAS INSERTION Right 01/14/2021   Procedure: INSERTION OF GAS;  Surgeon: Alvia Norleen BIRCH, MD;  Location: [facility] OR;  Service: Ophthalmology;  Laterality: Right;   GAS/FLUID EXCHANGE Right 01/14/2021   Procedure: GAS/FLUID EXCHANGE;  Surgeon: Alvia Norleen BIRCH, MD;  Location[facility] OR;  Service: Ophthalmology;  Laterality: Right;   INGUINAL HERNIA REPAIR Right 06/16/2024   Procedure: REPAIR, HERNIA, INGUINAL, ADULT;  Surgeon: Tye Millet, DO;  Location: [facility] ORS;  Service: General;  Laterality: Right;   INSERTION OF MESH  06/16/2024   Procedure: INSERTION OF MESH;  Surgeon: Tye Millet, DO;  Location: [facility] ORS;  Service: General;;   MEMBRANE PEEL Right  01/14/2021   Procedure: MEMBRANE PEEL;  Surgeon: Alvia Norleen BIRCH, MD;  Location[facility] OR;  Service: Ophthalmology;  Laterality: Right;   MICRODISCECTOMY LUMBAR N/A 06/12/2008   MICROLARYNGOSCOPY Bilateral 07/20/2023   Procedure: MICRODIRECT LARYNGOSCOPY WITH BIOPSY OF VOCAL CORD LESION;  Surgeon: Herminio Miu, MD;  Location: [facility] ORS;  Service: ENT;  Laterality: Bilateral;   PHOTOCOAGULATION WITH LASER Right 01/14/2021   Procedure: PHOTOCOAGULATION WITH LASER;  Surgeon: Alvia Norleen BIRCH, MD;  Location[facility] OR;  Service: Ophthalmology;  Laterality: Right;   SERUM PATCH Right 01/14/2021   Procedure: SERUM PATCH;  Surgeon: Alvia Norleen BIRCH, MD;  Location: [facility] OR;  Service: Ophthalmology;  Laterality: Right;    FAMILY HISTORY: Family History  Problem Relation Age of Onset   Hypertension Mother    Heart disease Father     ADVANCED DIRECTIVES (Y/N):  N  HEALTH MAINTENANCE: Social History   Tobacco Use   Smoking status: Former  Current packs/day: 0.00    Average packs/day: 2.0 packs/day for 50.0 years (100.0 ttl pk-yrs)    Types: Cigarettes    Quit date: 07/20/2023    Years since quitting: 1.0   Smokeless tobacco: Never  Vaping Use   Vaping status: Never Used  Substance Use Topics   Alcohol  use: Yes    Alcohol /week: 84.0 standard drinks of alcohol     Types: 84 Cans of beer per week    Comment: daily   Drug use: No     Colonoscopy:  PAP:  Bone density:  Lipid panel:  No Known Allergies  Current Outpatient Medications  Medication Sig Dispense Refill   acetaminophen  (TYLENOL ) 500 MG tablet Take 1,000 mg by mouth every 6 (six) hours as needed for mild pain (pain score 1-3).     ALPRAZolam  (XANAX ) 0.5 MG tablet Take 1 tablet (0.5 mg total) by mouth 3 (three) times daily as needed for anxiety. 90 tablet 0   atorvastatin  (LIPITOR) 40 MG tablet Take 1 tablet (40 mg total) by mouth daily. 30 tablet 1   enalapril  (VASOTEC ) 20 MG tablet Take 1 tablet (20 mg total) by mouth 2  (two) times daily. 60 tablet 3   ketoconazole (NIZORAL) 2 % shampoo Apply 1 Application topically daily.     nitroGLYCERIN  (NITROSTAT ) 0.4 MG SL tablet Place 1 tablet (0.4 mg total) under the tongue every 5 (five) minutes as needed for chest pain. 25 tablet 1   ondansetron  (ZOFRAN ) 8 MG tablet Take 1 tablet (8 mg total) by mouth every 8 (eight) hours as needed for nausea or vomiting. Start on the third day after cisplatin . 60 tablet 1   prochlorperazine  (COMPAZINE ) 10 MG tablet Take 1 tablet (10 mg total) by mouth every 6 (six) hours as needed (Nausea or vomiting). 60 tablet 1   sildenafil (VIAGRA) 50 MG tablet Take 50 mg by mouth daily as needed for erectile dysfunction.     torsemide  (DEMADEX ) 20 MG tablet Take 20 mg by mouth daily as needed.     traMADol  (ULTRAM ) 50 MG tablet Take 1 tablet (50 mg total) by mouth every 8 (eight) hours as needed. 6 tablet 0   VENTOLIN  HFA 108 (90 Base) MCG/ACT inhaler INHALE TWO PUFFS INTO THE LUNGS EVERY FOUR HOURS AS NEEDED FOR WHEEZING OR SHORTNESS OF BREATH (COUGH, SHORTNESS OF BREATH OR WHEEZING.). 18 each 1   No current facility-administered medications for this visit.    OBJECTIVE: Vitals:   08/03/24 1411 08/03/24 1419  BP: (!) 160/67 (!) 158/70  Pulse: 73   Resp: 18   Temp: 97.8 F (36.6 C)   SpO2: 100%       Body mass index is 23.22 kg/m.    ECOG FS:0 - Asymptomatic  General: Well-developed, well-nourished, no acute distress. Eyes: Pink conjunctiva, anicteric sclera. HEENT: Normocephalic, moist mucous membranes.  No palpable lymphadenopathy. Lungs: No audible wheezing or coughing. Heart: Regular rate and rhythm. Abdomen: Soft, nontender, no obvious distention. Musculoskeletal: No edema, cyanosis, or clubbing. Neuro: Alert, answering all questions appropriately. Cranial nerves grossly intact. Skin: No rashes or petechiae noted. Psych: Normal affect.  LAB RESULTS:  Lab Results  Component Value Date   NA 125 (L) 08/03/2024   K 4.2  08/03/2024   CL 92 (L) 08/03/2024   CO2 23 08/03/2024   GLUCOSE 98 08/03/2024   BUN 17 08/03/2024   CREATININE 1.22 08/03/2024   CALCIUM  9.2 08/03/2024   PROT 6.2 (L) 01/14/2021   ALBUMIN 3.8 01/14/2021   AST  22 01/14/2021   ALT 22 01/14/2021   ALKPHOS 63 01/14/2021   BILITOT 1.2 01/14/2021   GFRNONAA >60 08/03/2024   GFRAA >60 06/09/2018    Lab Results  Component Value Date   WBC 4.4 08/03/2024   NEUTROABS 2.4 08/03/2024   HGB 11.5 (L) 08/03/2024   HCT 33.1 (L) 08/03/2024   MCV 91.7 08/03/2024   PLT 161 08/03/2024     STUDIES: No results found.   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  Initially, case was discussed at tumor board and although total laryngectomy should be considered given the significant risk of chronic aspiration, patient adamantly refused surgical intervention.  Patient completed weekly cisplatin  on October 20, 2023.  XRT completed 1 week later on October 27, 2023.  Recent ENT evaluation did not reveal any recurrence of disease.  PET scan results from February 01, 2024 reviewed independently and report as above with no evidence of malignancy.  No further imaging is necessary unless there is suspicion of recurrence.  Patient has been instructed to keep his follow-up with ENT as scheduled.  Return to clinic in 6 months for routine evaluation.     Hypermetabolic prostate lesion: PET scan results as above.  Patient's PSA continues to trend up and is now 4.92.  Today's result is pending.  A referral has been sent back to urology for consideration of biopsy.   Difficulty swallowing/poor appetite: Resolved.   Panic attacks: Intermittent.  Patient was given a prescription for alprazolam  to use as needed.  Patient has been instructed that any further refills should come from his primary care physician.   Thrombocytopenia:  Chronic and unchanged. Anemia: Chronic and unchanged.  Patient's hemoglobin is 10.2 today.   Renal insufficiency: Creatinine improved to 1.42.  Monitor. Hypertension: Chronic and unchanged.  Continue monitoring and treatment per primary care.  Patient expressed understanding and was in agreement with this plan. He also understands that He can call clinic at any time with any questions, concerns, or complaints.    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

## 2024-08-04 ENCOUNTER — Ambulatory Visit

## 2024-08-04 ENCOUNTER — Encounter: Payer: Self-pay | Admitting: Oncology

## 2024-08-04 ENCOUNTER — Other Ambulatory Visit: Payer: Self-pay

## 2024-08-04 ENCOUNTER — Ambulatory Visit
Admission: RE | Admit: 2024-08-04 | Discharge: 2024-08-04 | Disposition: A | Discharge status: Home / Self Care | Source: Ambulatory Visit | Attending: Radiation Oncology | Admitting: Radiation Oncology

## 2024-08-04 DIAGNOSIS — C61 Malignant neoplasm of prostate: Secondary | ICD-10-CM | POA: Diagnosis not present

## 2024-08-04 LAB — RAD ONC ARIA SESSION SUMMARY
Course Elapsed Days: 1
Plan Fractions Treated to Date: 2
Plan Prescribed Dose Per Fraction: 2 Gy
Plan Total Fractions Prescribed: 40
Plan Total Prescribed Dose: 80 Gy
Reference Point Dosage Given to Date: 4 Gy
Reference Point Session Dosage Given: 2 Gy
Session Number: 2

## 2024-08-07 ENCOUNTER — Ambulatory Visit

## 2024-08-07 ENCOUNTER — Other Ambulatory Visit: Payer: Self-pay

## 2024-08-07 ENCOUNTER — Emergency Department
Admission: EM | Admit: 2024-08-07 | Discharge: 2024-08-07 | Disposition: A | Discharge status: Home / Self Care | Attending: Emergency Medicine | Admitting: Emergency Medicine

## 2024-08-07 ENCOUNTER — Emergency Department

## 2024-08-07 ENCOUNTER — Encounter: Payer: Self-pay | Admitting: Emergency Medicine

## 2024-08-07 ENCOUNTER — Encounter: Payer: Self-pay | Admitting: Oncology

## 2024-08-07 DIAGNOSIS — R55 Syncope and collapse: Secondary | ICD-10-CM | POA: Diagnosis not present

## 2024-08-07 DIAGNOSIS — J449 Chronic obstructive pulmonary disease, unspecified: Secondary | ICD-10-CM | POA: Diagnosis not present

## 2024-08-07 DIAGNOSIS — R531 Weakness: Secondary | ICD-10-CM | POA: Diagnosis not present

## 2024-08-07 DIAGNOSIS — I2510 Atherosclerotic heart disease of native coronary artery without angina pectoris: Secondary | ICD-10-CM | POA: Diagnosis not present

## 2024-08-07 DIAGNOSIS — I10 Essential (primary) hypertension: Secondary | ICD-10-CM | POA: Insufficient documentation

## 2024-08-07 DIAGNOSIS — R61 Generalized hyperhidrosis: Secondary | ICD-10-CM | POA: Diagnosis not present

## 2024-08-07 DIAGNOSIS — Z87891 Personal history of nicotine dependence: Secondary | ICD-10-CM | POA: Diagnosis not present

## 2024-08-07 DIAGNOSIS — I959 Hypotension, unspecified: Secondary | ICD-10-CM | POA: Diagnosis present

## 2024-08-07 LAB — URINALYSIS, W/ REFLEX TO CULTURE (INFECTION SUSPECTED)
Bacteria, UA: NONE SEEN
Bilirubin Urine: NEGATIVE
Glucose, UA: NEGATIVE mg/dL
Hgb urine dipstick: NEGATIVE
Ketones, ur: NEGATIVE mg/dL
Leukocytes,Ua: NEGATIVE
Nitrite: NEGATIVE
Protein, ur: NEGATIVE mg/dL
Specific Gravity, Urine: 1.009 (ref 1.005–1.030)
Squamous Epithelial / HPF: 0 /HPF (ref 0–5)
pH: 5 (ref 5.0–8.0)

## 2024-08-07 LAB — CBC WITH DIFFERENTIAL/PLATELET
Abs Immature Granulocytes: 0.02 K/uL (ref 0.00–0.07)
Basophils Absolute: 0 K/uL (ref 0.0–0.1)
Basophils Relative: 1 %
Eosinophils Absolute: 0.1 K/uL (ref 0.0–0.5)
Eosinophils Relative: 2 %
HCT: 33.6 % — ABNORMAL LOW (ref 39.0–52.0)
Hemoglobin: 11.7 g/dL — ABNORMAL LOW (ref 13.0–17.0)
Immature Granulocytes: 1 %
Lymphocytes Relative: 26 %
Lymphs Abs: 1.1 K/uL (ref 0.7–4.0)
MCH: 32 pg (ref 26.0–34.0)
MCHC: 34.8 g/dL (ref 30.0–36.0)
MCV: 91.8 fL (ref 80.0–100.0)
Monocytes Absolute: 0.4 K/uL (ref 0.1–1.0)
Monocytes Relative: 9 %
Neutro Abs: 2.8 K/uL (ref 1.7–7.7)
Neutrophils Relative %: 61 %
Platelets: 161 K/uL (ref 150–400)
RBC: 3.66 MIL/uL — ABNORMAL LOW (ref 4.22–5.81)
RDW: 13.3 % (ref 11.5–15.5)
WBC: 4.4 K/uL (ref 4.0–10.5)
nRBC: 0 % (ref 0.0–0.2)

## 2024-08-07 LAB — COMPREHENSIVE METABOLIC PANEL WITH GFR
ALT: 13 U/L (ref 0–44)
AST: 22 U/L (ref 15–41)
Albumin: 4 g/dL (ref 3.5–5.0)
Alkaline Phosphatase: 50 U/L (ref 38–126)
Anion gap: 12 (ref 5–15)
BUN: 17 mg/dL (ref 8–23)
CO2: 23 mmol/L (ref 22–32)
Calcium: 9.2 mg/dL (ref 8.9–10.3)
Chloride: 98 mmol/L (ref 98–111)
Creatinine, Ser: 1.32 mg/dL — ABNORMAL HIGH (ref 0.61–1.24)
GFR, Estimated: 55 mL/min — ABNORMAL LOW (ref >60)
Glucose, Bld: 122 mg/dL — ABNORMAL HIGH (ref 70–99)
Potassium: 3.9 mmol/L (ref 3.5–5.1)
Sodium: 133 mmol/L — ABNORMAL LOW (ref 135–145)
Total Bilirubin: 0.8 mg/dL (ref 0.0–1.2)
Total Protein: 7.1 g/dL (ref 6.5–8.1)

## 2024-08-07 LAB — MAGNESIUM: Magnesium: 1.8 mg/dL (ref 1.7–2.4)

## 2024-08-07 LAB — TROPONIN I (HIGH SENSITIVITY)
Troponin I (High Sensitivity): 9 ng/L (ref <18)
Troponin I (High Sensitivity): 10 ng/L (ref <18)

## 2024-08-07 NOTE — ED Provider Notes (Signed)
 [facility] Provider Note    Event Date/Time   First MD Initiated Contact with Patient 08/07/24 [phone]     (approximate)   History   Chief Complaint: Hypotension (Pt to ER via EMS for C/O general sickness - EMS states PT was pale, diaphoretic and was hypotensive @ 88/44 )   HPI  LUCY WOOLEVER is a 78 y.o. 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  EMS report that wife noted the patient to be clammy, lightheaded and called EMS.  He denies pain or shortness of breath.  EMS got there, noted patient to have a heart rate of 50, blood pressure of about 90/50.  They gave 500 mL IV fluid bolus with normalization of blood pressure.  Patient denies any acute complaints, no vomiting or fever, no dysuria.  Medications include carvedilol  3, enalapril  20, torsemide  20      Past Medical History:  Diagnosis Date   AAA (abdominal aortic aneurysm) (s/p EVAR)    a. 2012 3.4 cm; b. 07/2017 Abd u/s: Dist abd Ao dil up to 4.8cm; c. 03/2018 CT Abd: infrarenal AAA 5.3 x 5.7cm; d. 06/2018 EVAR (28mm prox/34mm dist, 18cm length Gore Excluder Endoprosthesis).   Anemia    Anxiety    a.) on BZO PRN (alprazolam )   Aortic atherosclerosis    Arthritis    CAD (coronary artery disease)    a.) Inf MI 2007 -- PCI of the RCA (overlapping 3.5 x 16 mm and 3.5 x 24 mm Liberte BMS) ; b.) PCI 2008 --> mLAD (2.5 x 28 mm Cypher DES) @ [facility]; c. 07/2015 Cath: LAD 100 prox to prev placed stent w/ R->L collats, RCA stents patent. EF 50-55%. Med Rx; d. 04/2018 MV: EF 48%, mid-dist ant/apical/apical-lat ischemia->med rx.   Cataracts, bilateral    Cellulitis, unspecified cellulitis site 05/2024   Cholelithiasis    Chronic heart failure with preserved ejection  fraction (HFpEF) (HCC)    a. 02/2014 Echo: EF 55-65%. Gr1 DD; b. 01/2018 Echo: EF 60-65%, Gr1 DD, mild MR, mildly dil LA. nl RV fxn. Nl PASP; c. 02/2024 Echo: EF 55-60%, no rwma, GrI DD, nl RV fxn, sev dil LA, mild-mod MR. AoV sclerosis.   COPD (chronic obstructive pulmonary disease) (HCC)    Diverticulosis    Erectile dysfunction    a.) on PDE5i (sildenafil)   ETOH abuse    Hepatic steatosis    a. 03/2018 CT Abd: hepatic steatosis and possible early cirrhotic morphology of liver.   Herniated lumbar intervertebral disc    a.) s/p L5-S1 microdiscectomy 06/12/2008   Hyperlipidemia    Hypertension    Inferior MI (HCC) 2007   a.) PCI with overlapping 3.5 x 16 mm and 3.5 x 24 mm Liberte BMS to the RCA   Long-term use of aspirin  therapy    Nephrolithiasis    Obesity    Panic disorder    Prostate cancer (HCC)    Right inguinal hernia    Squamous cell carcinoma of right vocal cord (HCC) 07/20/2023   a.) Bx (+) for invasive moderately differentiated stage III (cT3 N0 M0) SCC; Tx'd with weekly cisplatin  (completed 10/20/2023) + XRT (completed 10/27/2023)   Status post bilateral cataract extraction    Thrombocytopenia    Tobacco abuse  Current Outpatient Rx   Order #: [id number] Class: Historical Med   Order #: [id number] Class: Historical Med   Order #: [id number] Class: Normal   Order #: [id number] Class: Normal   Order #: [id number] Class: Normal   Order #: [id number] Class: Historical Med   Order #: [id number] Class: Normal   Order #: [id number] Class: Normal   Order #: [id number] Class: Normal   Order #: [id number] Class: Historical Med   Order #: [id number] Class: Historical Med   Order #: [id number] Class: Normal   Order #: [id number] Class: Normal   Order #: [id number] Class: Normal    Past Surgical History:  Procedure Laterality Date   25 GAUGE PARS PLANA VITRECTOMY WITH 20 GAUGE MVR PORT FOR MACULAR HOLE Right 01/14/2021   Procedure: 25 GAUGE PARS PLANA VITRECTOMY WITH 20 GAUGE MVR PORT FOR MACULAR  HOLE;  Surgeon: Alvia Norleen BIRCH, MD;  Location: [facility] OR;  Service: Ophthalmology;  Laterality: Right;   AIR/FLUID EXCHANGE Right 01/14/2021   Procedure: AIR/FLUID EXCHANGE;  Surgeon: Alvia Norleen BIRCH, MD;  Location: [facility] OR;  Service: Ophthalmology;  Laterality: Right;   CARDIAC CATHETERIZATION  08/2006   x1 stent [facility]. Mid RCA: 3.5 x 16 mm overlap with 3.5 x 24 mm Liberte bare-metal stents   CARDIAC CATHETERIZATION  10/2006   x1 stent [facility]: Mid LAD: 2.5 x 28 mm Cypher drug-eluting   CARDIAC CATHETERIZATION N/A 07/25/2015   Procedure: Left Heart Cath and Coronary Angiography;  Surgeon: Deatrice DELENA Cage, MD;  Location: [facility] INVASIVE CV LAB;  Service: Cardiovascular;  Laterality: N/A;   CATARACT EXTRACTION Bilateral    COLONOSCOPY     ENDOVASCULAR STENT GRAFT (AAA) N/A 06/08/2018   Procedure: ENDOVASCULAR REPAIR/STENT GRAFT;  Surgeon: Marea Selinda RAMAN, MD;  Location: [facility] INVASIVE CV LAB;  Service: Cardiovascular;  Laterality: N/A;   GAS INSERTION Right 01/14/2021   Procedure: INSERTION OF GAS;  Surgeon: Alvia Norleen BIRCH, MD;  Location: [facility] OR;  Service: Ophthalmology;  Laterality: Right;   GAS/FLUID EXCHANGE Right 01/14/2021   Procedure: GAS/FLUID EXCHANGE;  Surgeon: Alvia Norleen BIRCH, MD;  Location: [facility] OR;  Service: Ophthalmology;  Laterality: Right;   INGUINAL HERNIA REPAIR Right 06/16/2024   Procedure: REPAIR, HERNIA, INGUINAL, ADULT;  Surgeon: Tye Millet, DO;  Location: [facility] ORS;  Service: General;  Laterality: Right;   INSERTION OF MESH  06/16/2024   Procedure: INSERTION OF MESH;  Surgeon: Tye Millet, DO;  Location: [facility] ORS;  Service: General;;   MEMBRANE PEEL Right 01/14/2021   Procedure: MEMBRANE PEEL;  Surgeon: Alvia Norleen BIRCH, MD;  Location: [facility] OR;  Service: Ophthalmology;  Laterality: Right;   MICRODISCECTOMY LUMBAR N/A 06/12/2008   MICROLARYNGOSCOPY Bilateral 07/20/2023   Procedure: MICRODIRECT LARYNGOSCOPY WITH BIOPSY OF VOCAL CORD LESION;  Surgeon: Herminio Miu, MD;  Location: [facility]  ORS;  Service: ENT;  Laterality: Bilateral;   PHOTOCOAGULATION WITH LASER Right 01/14/2021   Procedure: PHOTOCOAGULATION WITH LASER;  Surgeon: Alvia Norleen BIRCH, MD;  Location: [facility] OR;  Service: Ophthalmology;  Laterality: Right;   SERUM PATCH Right 01/14/2021   Procedure: SERUM PATCH;  Surgeon: Alvia Norleen BIRCH, MD;  Location: [facility] OR;  Service: Ophthalmology;  Laterality: Right;    Physical Exam   Triage Vital Signs: ED Triage Vitals [08/07/24 0850]  Encounter Vitals Group     BP (!) 159/61     Girls Systolic BP Percentile      Girls Diastolic BP Percentile      Boys Systolic BP Percentile      Boys Diastolic BP Percentile      Pulse Rate 80  Resp 19     Temp (!) 97.2 F (36.2 C)     Temp src      SpO2 100 %     Weight 176 lb 5.9 oz (80 kg)     Height 6' (1.829 m)     Head Circumference      Peak Flow      Pain Score 0     Pain Loc      Pain Education      Exclude from Growth Chart     Most recent vital signs: Vitals:   08/07/24 1300 08/07/24 1308  BP: (!) 150/66   Pulse: (!) 55   Resp: 20   Temp:  98 F (36.7 C)  SpO2: 97%     General: Awake, no distress.  CV:  Good peripheral perfusion.  Regular rate rhythm, heart rate 80.  Normal distal pulses Resp:  Normal effort.  Clear lungs Abd:  No distention.  Soft nontender Other:  Somewhat dry oral mucosa.  Right lower extremity lymphedema, no calf tenderness.  Increased calf circumference on the right.   ED Results / Procedures / Treatments   Labs (all labs ordered are listed, but only abnormal results are displayed) Labs Reviewed  URINALYSIS, W/ REFLEX TO CULTURE (INFECTION SUSPECTED) - Abnormal; Notable for the following components:      Result Value   Color, Urine YELLOW (*)    APPearance CLEAR (*)    All other components within normal limits  COMPREHENSIVE METABOLIC PANEL WITH GFR - Abnormal; Notable for the following components:   Sodium 133 (*)    Glucose, Bld 122 (*)    Creatinine, Ser 1.32 (*)     GFR, Estimated 55 (*)    All other components within normal limits  CBC WITH DIFFERENTIAL/PLATELET - Abnormal; Notable for the following components:   RBC 3.66 (*)    Hemoglobin 11.7 (*)    HCT 33.6 (*)    All other components within normal limits  MAGNESIUM   CBC WITH DIFFERENTIAL/PLATELET  ETHANOL  TROPONIN I (HIGH SENSITIVITY)  TROPONIN I (HIGH SENSITIVITY)     EKG    RADIOLOGY Ultrasound right lower extremity interpreted by me, negative for DVT.  Radiology report reviewed   PROCEDURES:  Procedures   MEDICATIONS ORDERED IN ED: Medications - No data to display   IMPRESSION / MDM / ASSESSMENT AND PLAN / ED COURSE  I reviewed the triage vital signs and the nursing notes.  DDx: Dehydration, AKI, electrolyte derangement, anemia, urinary retention, UTI, right leg DVT, non-STEMI  Patient's presentation is most consistent with acute presentation with potential threat to life or bodily function.  Patient presents with an episode of near syncope at home.  No acute symptoms.  Rapidly returned back to normal.  EMS EKG at that time when he was symptomatic showed sinus bradycardia with a heart rate of 50.  Suspect vagal episode, will check for electrolyte abnormalities, AKI, NSTEMI, UTI, urinary retention.  Review of outside records shows he has had right leg edema since May 29, 2024, had reassuring right lower extremity ultrasound at that time.  Will repeat ultrasound today to ensure he has not developed a DVT.  ----------------------------------------- 2:11 PM on 08/07/2024 ----------------------------------------- Labs normal, ultrasound negative for DVT.  Continues to feel well in the ED with stable vital signs.  Sinus rhythm on the monitor.  No sign of any underlying arrhythmia or acute cardiac event.  Patient and spouse feel comfortable with outpatient follow-up, and he is stable for  discharge.       FINAL CLINICAL IMPRESSION(S) / ED DIAGNOSES   Final diagnoses:   Weakness     Rx / DC Orders   ED Discharge Orders     None        Note:  This document was prepared using Dragon voice recognition software and may include unintentional dictation errors.   Viviann Pastor, MD 08/07/24 [phone]

## 2024-08-08 ENCOUNTER — Ambulatory Visit

## 2024-08-09 ENCOUNTER — Other Ambulatory Visit: Payer: Self-pay

## 2024-08-09 ENCOUNTER — Ambulatory Visit
Admission: RE | Admit: 2024-08-09 | Discharge: 2024-08-09 | Disposition: A | Discharge status: Home / Self Care | Source: Ambulatory Visit | Attending: Radiation Oncology | Admitting: Radiation Oncology

## 2024-08-09 ENCOUNTER — Inpatient Hospital Stay: Attending: Hospice and Palliative Medicine

## 2024-08-09 DIAGNOSIS — C61 Malignant neoplasm of prostate: Secondary | ICD-10-CM | POA: Insufficient documentation

## 2024-08-09 DIAGNOSIS — E871 Hypo-osmolality and hyponatremia: Secondary | ICD-10-CM | POA: Insufficient documentation

## 2024-08-09 DIAGNOSIS — I10 Essential (primary) hypertension: Secondary | ICD-10-CM | POA: Insufficient documentation

## 2024-08-09 DIAGNOSIS — D649 Anemia, unspecified: Secondary | ICD-10-CM | POA: Insufficient documentation

## 2024-08-09 DIAGNOSIS — Z8521 Personal history of malignant neoplasm of larynx: Secondary | ICD-10-CM | POA: Insufficient documentation

## 2024-08-09 LAB — RAD ONC ARIA SESSION SUMMARY
Course Elapsed Days: 6
Plan Fractions Treated to Date: 3
Plan Prescribed Dose Per Fraction: 2 Gy
Plan Total Fractions Prescribed: 40
Plan Total Prescribed Dose: 80 Gy
Reference Point Dosage Given to Date: 6 Gy
Reference Point Session Dosage Given: 2 Gy
Session Number: 3

## 2024-08-10 ENCOUNTER — Ambulatory Visit: Admitting: Cardiovascular Disease

## 2024-08-10 ENCOUNTER — Ambulatory Visit
Admission: RE | Admit: 2024-08-10 | Discharge: 2024-08-10 | Disposition: A | Discharge status: Home / Self Care | Source: Ambulatory Visit | Attending: Radiation Oncology | Admitting: Radiation Oncology

## 2024-08-10 ENCOUNTER — Other Ambulatory Visit: Payer: Self-pay

## 2024-08-10 DIAGNOSIS — C61 Malignant neoplasm of prostate: Secondary | ICD-10-CM | POA: Diagnosis not present

## 2024-08-10 LAB — RAD ONC ARIA SESSION SUMMARY
Course Elapsed Days: 7
Plan Fractions Treated to Date: 4
Plan Prescribed Dose Per Fraction: 2 Gy
Plan Total Fractions Prescribed: 40
Plan Total Prescribed Dose: 80 Gy
Reference Point Dosage Given to Date: 8 Gy
Reference Point Session Dosage Given: 2 Gy
Session Number: 4

## 2024-08-11 ENCOUNTER — Ambulatory Visit
Admission: RE | Admit: 2024-08-11 | Discharge: 2024-08-11 | Disposition: A | Discharge status: Home / Self Care | Source: Ambulatory Visit | Attending: Radiation Oncology | Admitting: Radiation Oncology

## 2024-08-11 ENCOUNTER — Other Ambulatory Visit: Payer: Self-pay

## 2024-08-11 DIAGNOSIS — C61 Malignant neoplasm of prostate: Secondary | ICD-10-CM | POA: Diagnosis not present

## 2024-08-11 LAB — RAD ONC ARIA SESSION SUMMARY
Course Elapsed Days: 8
Plan Fractions Treated to Date: 5
Plan Prescribed Dose Per Fraction: 2 Gy
Plan Total Fractions Prescribed: 40
Plan Total Prescribed Dose: 80 Gy
Reference Point Dosage Given to Date: 10 Gy
Reference Point Session Dosage Given: 2 Gy
Session Number: 5

## 2024-08-14 ENCOUNTER — Other Ambulatory Visit: Payer: Self-pay

## 2024-08-14 ENCOUNTER — Ambulatory Visit
Admission: RE | Admit: 2024-08-14 | Discharge: 2024-08-14 | Disposition: A | Discharge status: Home / Self Care | Source: Ambulatory Visit | Attending: Radiation Oncology | Admitting: Radiation Oncology

## 2024-08-14 DIAGNOSIS — C61 Malignant neoplasm of prostate: Secondary | ICD-10-CM | POA: Diagnosis not present

## 2024-08-14 LAB — RAD ONC ARIA SESSION SUMMARY
Course Elapsed Days: 11
Plan Fractions Treated to Date: 6
Plan Prescribed Dose Per Fraction: 2 Gy
Plan Total Fractions Prescribed: 40
Plan Total Prescribed Dose: 80 Gy
Reference Point Dosage Given to Date: 12 Gy
Reference Point Session Dosage Given: 2 Gy
Session Number: 6

## 2024-08-15 ENCOUNTER — Ambulatory Visit
Admission: RE | Admit: 2024-08-15 | Discharge: 2024-08-15 | Disposition: A | Discharge status: Home / Self Care | Source: Ambulatory Visit | Attending: Radiation Oncology | Admitting: Radiation Oncology

## 2024-08-15 ENCOUNTER — Other Ambulatory Visit: Payer: Self-pay

## 2024-08-15 DIAGNOSIS — C61 Malignant neoplasm of prostate: Secondary | ICD-10-CM | POA: Diagnosis not present

## 2024-08-15 LAB — RAD ONC ARIA SESSION SUMMARY
Course Elapsed Days: 12
Plan Fractions Treated to Date: 7
Plan Prescribed Dose Per Fraction: 2 Gy
Plan Total Fractions Prescribed: 40
Plan Total Prescribed Dose: 80 Gy
Reference Point Dosage Given to Date: 14 Gy
Reference Point Session Dosage Given: 2 Gy
Session Number: 7

## 2024-08-16 ENCOUNTER — Other Ambulatory Visit: Payer: Self-pay

## 2024-08-16 ENCOUNTER — Ambulatory Visit
Admission: RE | Admit: 2024-08-16 | Discharge: 2024-08-16 | Disposition: A | Discharge status: Home / Self Care | Source: Ambulatory Visit | Attending: Radiation Oncology | Admitting: Radiation Oncology

## 2024-08-16 DIAGNOSIS — C61 Malignant neoplasm of prostate: Secondary | ICD-10-CM | POA: Diagnosis not present

## 2024-08-16 LAB — RAD ONC ARIA SESSION SUMMARY
Course Elapsed Days: 13
Plan Fractions Treated to Date: 8
Plan Prescribed Dose Per Fraction: 2 Gy
Plan Total Fractions Prescribed: 40
Plan Total Prescribed Dose: 80 Gy
Reference Point Dosage Given to Date: 16 Gy
Reference Point Session Dosage Given: 2 Gy
Session Number: 8

## 2024-08-17 ENCOUNTER — Ambulatory Visit
Admission: RE | Admit: 2024-08-17 | Discharge: 2024-08-17 | Disposition: A | Discharge status: Home / Self Care | Source: Ambulatory Visit | Attending: Radiation Oncology | Admitting: Radiation Oncology

## 2024-08-17 ENCOUNTER — Other Ambulatory Visit: Payer: Self-pay

## 2024-08-17 DIAGNOSIS — C61 Malignant neoplasm of prostate: Secondary | ICD-10-CM | POA: Diagnosis not present

## 2024-08-17 LAB — RAD ONC ARIA SESSION SUMMARY
Course Elapsed Days: 14
Plan Fractions Treated to Date: 9
Plan Prescribed Dose Per Fraction: 2 Gy
Plan Total Fractions Prescribed: 40
Plan Total Prescribed Dose: 80 Gy
Reference Point Dosage Given to Date: 18 Gy
Reference Point Session Dosage Given: 2 Gy
Session Number: 9

## 2024-08-18 ENCOUNTER — Ambulatory Visit
Admission: RE | Admit: 2024-08-18 | Discharge: 2024-08-18 | Disposition: A | Discharge status: Home / Self Care | Source: Ambulatory Visit | Attending: Radiation Oncology | Admitting: Radiation Oncology

## 2024-08-18 ENCOUNTER — Other Ambulatory Visit: Payer: Self-pay

## 2024-08-18 DIAGNOSIS — C61 Malignant neoplasm of prostate: Secondary | ICD-10-CM | POA: Diagnosis not present

## 2024-08-18 LAB — RAD ONC ARIA SESSION SUMMARY
Course Elapsed Days: 15
Plan Fractions Treated to Date: 10
Plan Prescribed Dose Per Fraction: 2 Gy
Plan Total Fractions Prescribed: 40
Plan Total Prescribed Dose: 80 Gy
Reference Point Dosage Given to Date: 20 Gy
Reference Point Session Dosage Given: 2 Gy
Session Number: 10

## 2024-08-20 ENCOUNTER — Ambulatory Visit

## 2024-08-21 ENCOUNTER — Other Ambulatory Visit: Payer: Self-pay

## 2024-08-21 ENCOUNTER — Ambulatory Visit
Admission: RE | Admit: 2024-08-21 | Discharge: 2024-08-21 | Disposition: A | Source: Ambulatory Visit | Attending: Radiation Oncology | Admitting: Radiation Oncology

## 2024-08-21 DIAGNOSIS — C61 Malignant neoplasm of prostate: Secondary | ICD-10-CM | POA: Diagnosis not present

## 2024-08-21 LAB — RAD ONC ARIA SESSION SUMMARY
Course Elapsed Days: 18
Plan Fractions Treated to Date: 11
Plan Prescribed Dose Per Fraction: 2 Gy
Plan Total Fractions Prescribed: 40
Plan Total Prescribed Dose: 80 Gy
Reference Point Dosage Given to Date: 22 Gy
Reference Point Session Dosage Given: 2 Gy
Session Number: 11

## 2024-08-22 ENCOUNTER — Other Ambulatory Visit: Payer: Self-pay

## 2024-08-22 ENCOUNTER — Ambulatory Visit
Admission: RE | Admit: 2024-08-22 | Discharge: 2024-08-22 | Disposition: A | Discharge status: Home / Self Care | Source: Ambulatory Visit | Attending: Radiation Oncology | Admitting: Radiation Oncology

## 2024-08-22 ENCOUNTER — Other Ambulatory Visit: Payer: Self-pay | Admitting: *Deleted

## 2024-08-22 DIAGNOSIS — C61 Malignant neoplasm of prostate: Secondary | ICD-10-CM | POA: Diagnosis not present

## 2024-08-22 LAB — RAD ONC ARIA SESSION SUMMARY
Course Elapsed Days: 19
Plan Fractions Treated to Date: 12
Plan Prescribed Dose Per Fraction: 2 Gy
Plan Total Fractions Prescribed: 40
Plan Total Prescribed Dose: 80 Gy
Reference Point Dosage Given to Date: 24 Gy
Reference Point Session Dosage Given: 2 Gy
Session Number: 12

## 2024-08-22 MED ORDER — TAMSULOSIN HCL 0.4 MG PO CAPS: 0.4000 mg | ORAL_CAPSULE | Freq: Every day | ORAL | 0 refills | Status: DC

## 2024-08-23 ENCOUNTER — Inpatient Hospital Stay

## 2024-08-23 ENCOUNTER — Other Ambulatory Visit: Payer: Self-pay

## 2024-08-23 ENCOUNTER — Ambulatory Visit
Admission: RE | Admit: 2024-08-23 | Discharge: 2024-08-23 | Disposition: A | Discharge status: Home / Self Care | Source: Ambulatory Visit | Attending: Radiation Oncology | Admitting: Radiation Oncology

## 2024-08-23 DIAGNOSIS — E871 Hypo-osmolality and hyponatremia: Secondary | ICD-10-CM | POA: Diagnosis not present

## 2024-08-23 DIAGNOSIS — C61 Malignant neoplasm of prostate: Secondary | ICD-10-CM

## 2024-08-23 DIAGNOSIS — Z8521 Personal history of malignant neoplasm of larynx: Secondary | ICD-10-CM | POA: Diagnosis not present

## 2024-08-23 DIAGNOSIS — D649 Anemia, unspecified: Secondary | ICD-10-CM | POA: Diagnosis not present

## 2024-08-23 DIAGNOSIS — I10 Essential (primary) hypertension: Secondary | ICD-10-CM | POA: Diagnosis not present

## 2024-08-23 LAB — RAD ONC ARIA SESSION SUMMARY
Course Elapsed Days: 20
Plan Fractions Treated to Date: 13
Plan Prescribed Dose Per Fraction: 2 Gy
Plan Total Fractions Prescribed: 40
Plan Total Prescribed Dose: 80 Gy
Reference Point Dosage Given to Date: 26 Gy
Reference Point Session Dosage Given: 2 Gy
Session Number: 13

## 2024-08-23 LAB — CBC (CANCER CENTER ONLY)
HCT: 32 % — ABNORMAL LOW (ref 39.0–52.0)
Hemoglobin: 11 g/dL — ABNORMAL LOW (ref 13.0–17.0)
MCH: 31.3 pg (ref 26.0–34.0)
MCHC: 34.4 g/dL (ref 30.0–36.0)
MCV: 91.2 fL (ref 80.0–100.0)
Platelet Count: 140 K/uL — ABNORMAL LOW (ref 150–400)
RBC: 3.51 MIL/uL — ABNORMAL LOW (ref 4.22–5.81)
RDW: 13.2 % (ref 11.5–15.5)
WBC Count: 4.8 K/uL (ref 4.0–10.5)
nRBC: 0 % (ref 0.0–0.2)

## 2024-08-24 ENCOUNTER — Other Ambulatory Visit: Payer: Self-pay

## 2024-08-24 ENCOUNTER — Ambulatory Visit
Admission: RE | Admit: 2024-08-24 | Discharge: 2024-08-24 | Disposition: A | Source: Ambulatory Visit | Attending: Radiation Oncology | Admitting: Radiation Oncology

## 2024-08-24 DIAGNOSIS — C329 Malignant neoplasm of larynx, unspecified: Secondary | ICD-10-CM | POA: Diagnosis not present

## 2024-08-24 DIAGNOSIS — R490 Dysphonia: Secondary | ICD-10-CM | POA: Diagnosis not present

## 2024-08-24 DIAGNOSIS — C61 Malignant neoplasm of prostate: Secondary | ICD-10-CM | POA: Diagnosis not present

## 2024-08-24 LAB — RAD ONC ARIA SESSION SUMMARY
Course Elapsed Days: 21
Plan Fractions Treated to Date: 14
Plan Prescribed Dose Per Fraction: 2 Gy
Plan Total Fractions Prescribed: 40
Plan Total Prescribed Dose: 80 Gy
Reference Point Dosage Given to Date: 28 Gy
Reference Point Session Dosage Given: 2 Gy
Session Number: 14

## 2024-08-25 ENCOUNTER — Other Ambulatory Visit: Payer: Self-pay

## 2024-08-25 ENCOUNTER — Ambulatory Visit
Admission: RE | Admit: 2024-08-25 | Discharge: 2024-08-25 | Disposition: A | Discharge status: Home / Self Care | Source: Ambulatory Visit | Attending: Radiation Oncology | Admitting: Radiation Oncology

## 2024-08-25 DIAGNOSIS — C61 Malignant neoplasm of prostate: Secondary | ICD-10-CM | POA: Diagnosis not present

## 2024-08-25 LAB — RAD ONC ARIA SESSION SUMMARY
Course Elapsed Days: 22
Plan Fractions Treated to Date: 15
Plan Prescribed Dose Per Fraction: 2 Gy
Plan Total Fractions Prescribed: 40
Plan Total Prescribed Dose: 80 Gy
Reference Point Dosage Given to Date: 30 Gy
Reference Point Session Dosage Given: 2 Gy
Session Number: 15

## 2024-08-28 ENCOUNTER — Ambulatory Visit

## 2024-08-28 ENCOUNTER — Other Ambulatory Visit: Payer: Self-pay

## 2024-08-28 ENCOUNTER — Ambulatory Visit
Admission: RE | Admit: 2024-08-28 | Discharge: 2024-08-28 | Disposition: A | Discharge status: Home / Self Care | Source: Ambulatory Visit | Attending: Radiation Oncology | Admitting: Radiation Oncology

## 2024-08-28 ENCOUNTER — Ambulatory Visit: Admission: RE | Admit: 2024-08-28 | Source: Ambulatory Visit

## 2024-08-28 DIAGNOSIS — C61 Malignant neoplasm of prostate: Secondary | ICD-10-CM | POA: Diagnosis not present

## 2024-08-28 LAB — RAD ONC ARIA SESSION SUMMARY
Course Elapsed Days: 25
Plan Fractions Treated to Date: 16
Plan Prescribed Dose Per Fraction: 2 Gy
Plan Total Fractions Prescribed: 40
Plan Total Prescribed Dose: 80 Gy
Reference Point Dosage Given to Date: 32 Gy
Reference Point Session Dosage Given: 2 Gy
Session Number: 16

## 2024-08-29 ENCOUNTER — Ambulatory Visit
Admission: RE | Admit: 2024-08-29 | Discharge: 2024-08-29 | Disposition: A | Source: Ambulatory Visit | Attending: Radiation Oncology | Admitting: Radiation Oncology

## 2024-08-29 ENCOUNTER — Other Ambulatory Visit: Payer: Self-pay

## 2024-08-29 DIAGNOSIS — C61 Malignant neoplasm of prostate: Secondary | ICD-10-CM | POA: Diagnosis not present

## 2024-08-29 LAB — RAD ONC ARIA SESSION SUMMARY
Course Elapsed Days: 26
Plan Fractions Treated to Date: 17
Plan Prescribed Dose Per Fraction: 2 Gy
Plan Total Fractions Prescribed: 40
Plan Total Prescribed Dose: 80 Gy
Reference Point Dosage Given to Date: 34 Gy
Reference Point Session Dosage Given: 2 Gy
Session Number: 17

## 2024-08-30 ENCOUNTER — Other Ambulatory Visit: Payer: Self-pay

## 2024-08-30 ENCOUNTER — Ambulatory Visit
Admission: RE | Admit: 2024-08-30 | Discharge: 2024-08-30 | Disposition: A | Discharge status: Home / Self Care | Source: Ambulatory Visit | Attending: Radiation Oncology | Admitting: Radiation Oncology

## 2024-08-30 DIAGNOSIS — C61 Malignant neoplasm of prostate: Secondary | ICD-10-CM | POA: Diagnosis not present

## 2024-08-30 LAB — RAD ONC ARIA SESSION SUMMARY
Course Elapsed Days: 27
Plan Fractions Treated to Date: 18
Plan Prescribed Dose Per Fraction: 2 Gy
Plan Total Fractions Prescribed: 40
Plan Total Prescribed Dose: 80 Gy
Reference Point Dosage Given to Date: 36 Gy
Reference Point Session Dosage Given: 2 Gy
Session Number: 18

## 2024-09-03 ENCOUNTER — Ambulatory Visit

## 2024-09-04 ENCOUNTER — Other Ambulatory Visit: Payer: Self-pay

## 2024-09-04 ENCOUNTER — Ambulatory Visit
Admission: RE | Admit: 2024-09-04 | Discharge: 2024-09-04 | Disposition: A | Discharge status: Home / Self Care | Source: Ambulatory Visit | Attending: Radiation Oncology | Admitting: Radiation Oncology

## 2024-09-04 DIAGNOSIS — C61 Malignant neoplasm of prostate: Secondary | ICD-10-CM | POA: Diagnosis present

## 2024-09-04 LAB — RAD ONC ARIA SESSION SUMMARY
Course Elapsed Days: 32
Plan Fractions Treated to Date: 19
Plan Prescribed Dose Per Fraction: 2 Gy
Plan Total Fractions Prescribed: 40
Plan Total Prescribed Dose: 80 Gy
Reference Point Dosage Given to Date: 38 Gy
Reference Point Session Dosage Given: 2 Gy
Session Number: 19

## 2024-09-05 ENCOUNTER — Other Ambulatory Visit: Payer: Self-pay

## 2024-09-05 ENCOUNTER — Ambulatory Visit
Admission: RE | Admit: 2024-09-05 | Discharge: 2024-09-05 | Disposition: A | Source: Ambulatory Visit | Attending: Radiation Oncology | Admitting: Radiation Oncology

## 2024-09-05 DIAGNOSIS — C61 Malignant neoplasm of prostate: Secondary | ICD-10-CM | POA: Diagnosis not present

## 2024-09-05 LAB — RAD ONC ARIA SESSION SUMMARY
Course Elapsed Days: 33
Plan Fractions Treated to Date: 20
Plan Prescribed Dose Per Fraction: 2 Gy
Plan Total Fractions Prescribed: 40
Plan Total Prescribed Dose: 80 Gy
Reference Point Dosage Given to Date: 40 Gy
Reference Point Session Dosage Given: 2 Gy
Session Number: 20

## 2024-09-06 ENCOUNTER — Other Ambulatory Visit (HOSPITAL_COMMUNITY): Payer: Self-pay

## 2024-09-06 ENCOUNTER — Inpatient Hospital Stay: Attending: Hospice and Palliative Medicine

## 2024-09-06 ENCOUNTER — Other Ambulatory Visit: Payer: Self-pay

## 2024-09-06 ENCOUNTER — Ambulatory Visit
Admission: RE | Admit: 2024-09-06 | Discharge: 2024-09-06 | Disposition: A | Source: Ambulatory Visit | Attending: Radiation Oncology | Admitting: Radiation Oncology

## 2024-09-06 DIAGNOSIS — C329 Malignant neoplasm of larynx, unspecified: Secondary | ICD-10-CM

## 2024-09-06 DIAGNOSIS — C61 Malignant neoplasm of prostate: Secondary | ICD-10-CM | POA: Diagnosis not present

## 2024-09-06 DIAGNOSIS — Z8521 Personal history of malignant neoplasm of larynx: Secondary | ICD-10-CM | POA: Insufficient documentation

## 2024-09-06 LAB — RAD ONC ARIA SESSION SUMMARY
Course Elapsed Days: 34
Plan Fractions Treated to Date: 21
Plan Prescribed Dose Per Fraction: 2 Gy
Plan Total Fractions Prescribed: 40
Plan Total Prescribed Dose: 80 Gy
Reference Point Dosage Given to Date: 42 Gy
Reference Point Session Dosage Given: 2 Gy
Session Number: 21

## 2024-09-06 LAB — CBC (CANCER CENTER ONLY)
HCT: 32 % — ABNORMAL LOW (ref 39.0–52.0)
Hemoglobin: 11.1 g/dL — ABNORMAL LOW (ref 13.0–17.0)
MCH: 31.6 pg (ref 26.0–34.0)
MCHC: 34.7 g/dL (ref 30.0–36.0)
MCV: 91.2 fL (ref 80.0–100.0)
Platelet Count: 131 K/uL — ABNORMAL LOW (ref 150–400)
RBC: 3.51 MIL/uL — ABNORMAL LOW (ref 4.22–5.81)
RDW: 13.6 % (ref 11.5–15.5)
WBC Count: 4 K/uL (ref 4.0–10.5)
nRBC: 0 % (ref 0.0–0.2)

## 2024-09-07 ENCOUNTER — Other Ambulatory Visit: Payer: Self-pay

## 2024-09-07 ENCOUNTER — Ambulatory Visit
Admission: RE | Admit: 2024-09-07 | Discharge: 2024-09-07 | Disposition: A | Source: Ambulatory Visit | Attending: Radiation Oncology | Admitting: Radiation Oncology

## 2024-09-07 DIAGNOSIS — C61 Malignant neoplasm of prostate: Secondary | ICD-10-CM | POA: Diagnosis not present

## 2024-09-07 LAB — RAD ONC ARIA SESSION SUMMARY
Course Elapsed Days: 35
Plan Fractions Treated to Date: 22
Plan Prescribed Dose Per Fraction: 2 Gy
Plan Total Fractions Prescribed: 40
Plan Total Prescribed Dose: 80 Gy
Reference Point Dosage Given to Date: 44 Gy
Reference Point Session Dosage Given: 2 Gy
Session Number: 22

## 2024-09-08 ENCOUNTER — Ambulatory Visit
Admission: RE | Admit: 2024-09-08 | Discharge: 2024-09-08 | Disposition: A | Source: Ambulatory Visit | Attending: Radiation Oncology | Admitting: Radiation Oncology

## 2024-09-08 ENCOUNTER — Other Ambulatory Visit: Payer: Self-pay

## 2024-09-08 DIAGNOSIS — C61 Malignant neoplasm of prostate: Secondary | ICD-10-CM | POA: Diagnosis not present

## 2024-09-08 LAB — RAD ONC ARIA SESSION SUMMARY
Course Elapsed Days: 36
Plan Fractions Treated to Date: 23
Plan Prescribed Dose Per Fraction: 2 Gy
Plan Total Fractions Prescribed: 40
Plan Total Prescribed Dose: 80 Gy
Reference Point Dosage Given to Date: 46 Gy
Reference Point Session Dosage Given: 2 Gy
Session Number: 23

## 2024-09-11 ENCOUNTER — Ambulatory Visit
Admission: RE | Admit: 2024-09-11 | Discharge: 2024-09-11 | Disposition: A | Source: Ambulatory Visit | Attending: Radiation Oncology | Admitting: Radiation Oncology

## 2024-09-11 ENCOUNTER — Other Ambulatory Visit: Payer: Self-pay

## 2024-09-11 DIAGNOSIS — C61 Malignant neoplasm of prostate: Secondary | ICD-10-CM | POA: Diagnosis not present

## 2024-09-11 LAB — RAD ONC ARIA SESSION SUMMARY
Course Elapsed Days: 39
Plan Fractions Treated to Date: 24
Plan Prescribed Dose Per Fraction: 2 Gy
Plan Total Fractions Prescribed: 40
Plan Total Prescribed Dose: 80 Gy
Reference Point Dosage Given to Date: 48 Gy
Reference Point Session Dosage Given: 2 Gy
Session Number: 24

## 2024-09-12 ENCOUNTER — Other Ambulatory Visit: Payer: Self-pay

## 2024-09-12 ENCOUNTER — Ambulatory Visit
Admission: RE | Admit: 2024-09-12 | Discharge: 2024-09-12 | Disposition: A | Source: Ambulatory Visit | Attending: Radiation Oncology | Admitting: Radiation Oncology

## 2024-09-12 DIAGNOSIS — C61 Malignant neoplasm of prostate: Secondary | ICD-10-CM | POA: Diagnosis not present

## 2024-09-12 LAB — RAD ONC ARIA SESSION SUMMARY
Course Elapsed Days: 40
Plan Fractions Treated to Date: 25
Plan Prescribed Dose Per Fraction: 2 Gy
Plan Total Fractions Prescribed: 40
Plan Total Prescribed Dose: 80 Gy
Reference Point Dosage Given to Date: 50 Gy
Reference Point Session Dosage Given: 2 Gy
Session Number: 25

## 2024-09-13 ENCOUNTER — Other Ambulatory Visit: Payer: Self-pay

## 2024-09-13 ENCOUNTER — Ambulatory Visit
Admission: RE | Admit: 2024-09-13 | Discharge: 2024-09-13 | Disposition: A | Discharge status: Home / Self Care | Source: Ambulatory Visit | Attending: Radiation Oncology | Admitting: Radiation Oncology

## 2024-09-13 DIAGNOSIS — C61 Malignant neoplasm of prostate: Secondary | ICD-10-CM | POA: Diagnosis not present

## 2024-09-13 LAB — RAD ONC ARIA SESSION SUMMARY
Course Elapsed Days: 41
Plan Fractions Treated to Date: 26
Plan Prescribed Dose Per Fraction: 2 Gy
Plan Total Fractions Prescribed: 40
Plan Total Prescribed Dose: 80 Gy
Reference Point Dosage Given to Date: 52 Gy
Reference Point Session Dosage Given: 2 Gy
Session Number: 26

## 2024-09-14 ENCOUNTER — Other Ambulatory Visit: Payer: Self-pay

## 2024-09-14 ENCOUNTER — Ambulatory Visit
Admission: RE | Admit: 2024-09-14 | Discharge: 2024-09-14 | Disposition: A | Discharge status: Home / Self Care | Source: Ambulatory Visit | Attending: Radiation Oncology | Admitting: Radiation Oncology

## 2024-09-14 DIAGNOSIS — C61 Malignant neoplasm of prostate: Secondary | ICD-10-CM | POA: Diagnosis not present

## 2024-09-14 LAB — RAD ONC ARIA SESSION SUMMARY
Course Elapsed Days: 42
Plan Fractions Treated to Date: 27
Plan Prescribed Dose Per Fraction: 2 Gy
Plan Total Fractions Prescribed: 40
Plan Total Prescribed Dose: 80 Gy
Reference Point Dosage Given to Date: 54 Gy
Reference Point Session Dosage Given: 2 Gy
Session Number: 27

## 2024-09-15 ENCOUNTER — Ambulatory Visit
Admission: RE | Admit: 2024-09-15 | Discharge: 2024-09-15 | Disposition: A | Source: Ambulatory Visit | Attending: Radiation Oncology | Admitting: Radiation Oncology

## 2024-09-15 ENCOUNTER — Other Ambulatory Visit: Payer: Self-pay

## 2024-09-15 DIAGNOSIS — C61 Malignant neoplasm of prostate: Secondary | ICD-10-CM | POA: Diagnosis not present

## 2024-09-15 LAB — RAD ONC ARIA SESSION SUMMARY
Course Elapsed Days: 43
Plan Fractions Treated to Date: 28
Plan Prescribed Dose Per Fraction: 2 Gy
Plan Total Fractions Prescribed: 40
Plan Total Prescribed Dose: 80 Gy
Reference Point Dosage Given to Date: 56 Gy
Reference Point Session Dosage Given: 2 Gy
Session Number: 28

## 2024-09-18 ENCOUNTER — Other Ambulatory Visit: Payer: Self-pay

## 2024-09-18 ENCOUNTER — Ambulatory Visit
Admission: RE | Admit: 2024-09-18 | Discharge: 2024-09-18 | Disposition: A | Source: Ambulatory Visit | Attending: Radiation Oncology | Admitting: Radiation Oncology

## 2024-09-18 DIAGNOSIS — C61 Malignant neoplasm of prostate: Secondary | ICD-10-CM | POA: Diagnosis not present

## 2024-09-18 LAB — RAD ONC ARIA SESSION SUMMARY
Course Elapsed Days: 46
Plan Fractions Treated to Date: 29
Plan Prescribed Dose Per Fraction: 2 Gy
Plan Total Fractions Prescribed: 40
Plan Total Prescribed Dose: 80 Gy
Reference Point Dosage Given to Date: 58 Gy
Reference Point Session Dosage Given: 2 Gy
Session Number: 29

## 2024-09-19 ENCOUNTER — Other Ambulatory Visit: Payer: Self-pay | Admitting: Radiation Oncology

## 2024-09-19 ENCOUNTER — Ambulatory Visit
Admission: RE | Admit: 2024-09-19 | Discharge: 2024-09-19 | Disposition: A | Source: Ambulatory Visit | Attending: Radiation Oncology | Admitting: Radiation Oncology

## 2024-09-19 ENCOUNTER — Other Ambulatory Visit: Payer: Self-pay

## 2024-09-19 DIAGNOSIS — C61 Malignant neoplasm of prostate: Secondary | ICD-10-CM | POA: Diagnosis not present

## 2024-09-19 LAB — RAD ONC ARIA SESSION SUMMARY
Course Elapsed Days: 47
Plan Fractions Treated to Date: 30
Plan Prescribed Dose Per Fraction: 2 Gy
Plan Total Fractions Prescribed: 40
Plan Total Prescribed Dose: 80 Gy
Reference Point Dosage Given to Date: 60 Gy
Reference Point Session Dosage Given: 2 Gy
Session Number: 30

## 2024-09-20 ENCOUNTER — Ambulatory Visit
Admission: RE | Admit: 2024-09-20 | Discharge: 2024-09-20 | Attending: Radiation Oncology | Admitting: Radiation Oncology

## 2024-09-20 ENCOUNTER — Inpatient Hospital Stay

## 2024-09-20 ENCOUNTER — Other Ambulatory Visit: Payer: Self-pay

## 2024-09-20 DIAGNOSIS — Z8521 Personal history of malignant neoplasm of larynx: Secondary | ICD-10-CM | POA: Diagnosis not present

## 2024-09-20 DIAGNOSIS — C61 Malignant neoplasm of prostate: Secondary | ICD-10-CM

## 2024-09-20 LAB — RAD ONC ARIA SESSION SUMMARY
Course Elapsed Days: 48
Plan Fractions Treated to Date: 31
Plan Prescribed Dose Per Fraction: 2 Gy
Plan Total Fractions Prescribed: 40
Plan Total Prescribed Dose: 80 Gy
Reference Point Dosage Given to Date: 62 Gy
Reference Point Session Dosage Given: 2 Gy
Session Number: 31

## 2024-09-20 LAB — CBC (CANCER CENTER ONLY)
HCT: 33.4 % — ABNORMAL LOW (ref 39.0–52.0)
Hemoglobin: 11.4 g/dL — ABNORMAL LOW (ref 13.0–17.0)
MCH: 31.6 pg (ref 26.0–34.0)
MCHC: 34.1 g/dL (ref 30.0–36.0)
MCV: 92.5 fL (ref 80.0–100.0)
Platelet Count: 132 K/uL — ABNORMAL LOW (ref 150–400)
RBC: 3.61 MIL/uL — ABNORMAL LOW (ref 4.22–5.81)
RDW: 13.2 % (ref 11.5–15.5)
WBC Count: 3.6 K/uL — ABNORMAL LOW (ref 4.0–10.5)
nRBC: 0 % (ref 0.0–0.2)

## 2024-09-21 ENCOUNTER — Other Ambulatory Visit: Payer: Self-pay

## 2024-09-21 ENCOUNTER — Ambulatory Visit
Admission: RE | Admit: 2024-09-21 | Discharge: 2024-09-21 | Attending: Radiation Oncology | Admitting: Radiation Oncology

## 2024-09-21 DIAGNOSIS — C61 Malignant neoplasm of prostate: Secondary | ICD-10-CM | POA: Diagnosis not present

## 2024-09-21 LAB — RAD ONC ARIA SESSION SUMMARY
Course Elapsed Days: 49
Plan Fractions Treated to Date: 32
Plan Prescribed Dose Per Fraction: 2 Gy
Plan Total Fractions Prescribed: 40
Plan Total Prescribed Dose: 80 Gy
Reference Point Dosage Given to Date: 64 Gy
Reference Point Session Dosage Given: 2 Gy
Session Number: 32

## 2024-09-22 ENCOUNTER — Ambulatory Visit
Admission: RE | Admit: 2024-09-22 | Discharge: 2024-09-22 | Attending: Radiation Oncology | Admitting: Radiation Oncology

## 2024-09-22 DIAGNOSIS — C61 Malignant neoplasm of prostate: Secondary | ICD-10-CM | POA: Diagnosis not present

## 2024-09-22 LAB — RAD ONC ARIA SESSION SUMMARY
Course Elapsed Days: 50
Plan Fractions Treated to Date: 33
Plan Prescribed Dose Per Fraction: 2 Gy
Plan Total Fractions Prescribed: 40
Plan Total Prescribed Dose: 80 Gy
Reference Point Dosage Given to Date: 66 Gy
Reference Point Session Dosage Given: 2 Gy
Session Number: 33

## 2024-09-25 ENCOUNTER — Other Ambulatory Visit: Payer: Self-pay

## 2024-09-25 ENCOUNTER — Ambulatory Visit

## 2024-09-25 DIAGNOSIS — C61 Malignant neoplasm of prostate: Secondary | ICD-10-CM | POA: Diagnosis not present

## 2024-09-25 LAB — RAD ONC ARIA SESSION SUMMARY
Course Elapsed Days: 53
Plan Fractions Treated to Date: 34
Plan Prescribed Dose Per Fraction: 2 Gy
Plan Total Fractions Prescribed: 40
Plan Total Prescribed Dose: 80 Gy
Reference Point Dosage Given to Date: 68 Gy
Reference Point Session Dosage Given: 2 Gy
Session Number: 34

## 2024-09-26 ENCOUNTER — Ambulatory Visit
Admission: RE | Admit: 2024-09-26 | Discharge: 2024-09-26 | Disposition: A | Discharge status: Home / Self Care | Source: Ambulatory Visit | Attending: Radiation Oncology | Admitting: Radiation Oncology

## 2024-09-26 ENCOUNTER — Other Ambulatory Visit: Payer: Self-pay

## 2024-09-26 DIAGNOSIS — C61 Malignant neoplasm of prostate: Secondary | ICD-10-CM | POA: Diagnosis not present

## 2024-09-26 LAB — RAD ONC ARIA SESSION SUMMARY
Course Elapsed Days: 54
Plan Fractions Treated to Date: 35
Plan Prescribed Dose Per Fraction: 2 Gy
Plan Total Fractions Prescribed: 40
Plan Total Prescribed Dose: 80 Gy
Reference Point Dosage Given to Date: 70 Gy
Reference Point Session Dosage Given: 2 Gy
Session Number: 35

## 2024-09-27 ENCOUNTER — Other Ambulatory Visit: Payer: Self-pay

## 2024-09-27 DIAGNOSIS — C61 Malignant neoplasm of prostate: Secondary | ICD-10-CM | POA: Diagnosis not present

## 2024-09-27 LAB — RAD ONC ARIA SESSION SUMMARY
Course Elapsed Days: 55
Plan Fractions Treated to Date: 36
Plan Prescribed Dose Per Fraction: 2 Gy
Plan Total Fractions Prescribed: 40
Plan Total Prescribed Dose: 80 Gy
Reference Point Dosage Given to Date: 72 Gy
Reference Point Session Dosage Given: 2 Gy
Session Number: 36

## 2024-09-29 ENCOUNTER — Ambulatory Visit

## 2024-10-01 ENCOUNTER — Other Ambulatory Visit: Payer: Self-pay

## 2024-10-02 ENCOUNTER — Ambulatory Visit
Admission: RE | Admit: 2024-10-02 | Discharge: 2024-10-02 | Disposition: A | Discharge status: Home / Self Care | Source: Ambulatory Visit | Attending: Radiation Oncology | Admitting: Radiation Oncology

## 2024-10-02 ENCOUNTER — Other Ambulatory Visit: Payer: Self-pay

## 2024-10-02 DIAGNOSIS — C61 Malignant neoplasm of prostate: Secondary | ICD-10-CM | POA: Diagnosis not present

## 2024-10-02 LAB — RAD ONC ARIA SESSION SUMMARY
Course Elapsed Days: 60
Plan Fractions Treated to Date: 37
Plan Prescribed Dose Per Fraction: 2 Gy
Plan Total Fractions Prescribed: 40
Plan Total Prescribed Dose: 80 Gy
Reference Point Dosage Given to Date: 74 Gy
Reference Point Session Dosage Given: 2 Gy
Session Number: 37

## 2024-10-03 ENCOUNTER — Ambulatory Visit

## 2024-10-03 ENCOUNTER — Ambulatory Visit
Admission: RE | Admit: 2024-10-03 | Discharge: 2024-10-03 | Disposition: A | Discharge status: Home / Self Care | Source: Ambulatory Visit | Attending: Radiation Oncology | Admitting: Radiation Oncology

## 2024-10-03 ENCOUNTER — Other Ambulatory Visit: Payer: Self-pay

## 2024-10-03 DIAGNOSIS — C61 Malignant neoplasm of prostate: Secondary | ICD-10-CM | POA: Diagnosis not present

## 2024-10-03 LAB — RAD ONC ARIA SESSION SUMMARY
Course Elapsed Days: 61
Plan Fractions Treated to Date: 38
Plan Prescribed Dose Per Fraction: 2 Gy
Plan Total Fractions Prescribed: 40
Plan Total Prescribed Dose: 80 Gy
Reference Point Dosage Given to Date: 76 Gy
Reference Point Session Dosage Given: 2 Gy
Session Number: 38

## 2024-10-04 ENCOUNTER — Other Ambulatory Visit: Payer: Self-pay

## 2024-10-04 ENCOUNTER — Ambulatory Visit

## 2024-10-04 ENCOUNTER — Ambulatory Visit
Admission: RE | Admit: 2024-10-04 | Discharge: 2024-10-04 | Disposition: A | Discharge status: Home / Self Care | Source: Ambulatory Visit | Attending: Radiation Oncology | Admitting: Radiation Oncology

## 2024-10-04 DIAGNOSIS — C61 Malignant neoplasm of prostate: Secondary | ICD-10-CM | POA: Diagnosis not present

## 2024-10-04 LAB — RAD ONC ARIA SESSION SUMMARY
Course Elapsed Days: 62
Plan Fractions Treated to Date: 39
Plan Prescribed Dose Per Fraction: 2 Gy
Plan Total Fractions Prescribed: 40
Plan Total Prescribed Dose: 80 Gy
Reference Point Dosage Given to Date: 78 Gy
Reference Point Session Dosage Given: 2 Gy
Session Number: 39

## 2024-10-06 ENCOUNTER — Other Ambulatory Visit: Payer: Self-pay

## 2024-10-06 ENCOUNTER — Ambulatory Visit

## 2024-10-06 ENCOUNTER — Ambulatory Visit
Admission: RE | Admit: 2024-10-06 | Discharge: 2024-10-06 | Disposition: A | Discharge status: Home / Self Care | Source: Ambulatory Visit | Attending: Radiation Oncology | Admitting: Radiation Oncology

## 2024-10-06 DIAGNOSIS — C61 Malignant neoplasm of prostate: Secondary | ICD-10-CM | POA: Diagnosis present

## 2024-10-06 LAB — RAD ONC ARIA SESSION SUMMARY
Course Elapsed Days: 64
Plan Fractions Treated to Date: 40
Plan Prescribed Dose Per Fraction: 2 Gy
Plan Total Fractions Prescribed: 40
Plan Total Prescribed Dose: 80 Gy
Reference Point Dosage Given to Date: 80 Gy
Reference Point Session Dosage Given: 2 Gy
Session Number: 40

## 2024-10-09 ENCOUNTER — Ambulatory Visit

## 2024-10-09 NOTE — Radiation Completion Notes (Signed)
 Patient Name: Bobby Sandoval, Bobby Sandoval MRN: [id number] Date of Birth: [birth year] Referring Physician: NORLEEN ROWER, M.D. Date of Service: 2024-10-09 Radiation Oncologist: Marcey Penton, M.D. [facility] [place]                             RADIATION ONCOLOGY END OF TREATMENT NOTE     Diagnosis: C61 Malignant neoplasm of prostate Staging on 2023-08-06: Squamous cell carcinoma of right vocal cord (HCC) T=cT3, N=cN0, M=cM0 Intent: Curative     HPI: Patient is.  A 79 year old male who originally consulted back in July when he presented with a stage IIc Gleason 8 (4+4) adenocarcinoma the prostate.  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  He had a unilateral inguinal herniorrhaphy repair.  According to surgeon he is cleared out to proceed with radiation therapy.  He continues to have some urinary frequency and urgency nocturia x 3-4.  He is having no bowel complaints.      ==========DELIVERED PLANS==========  First Treatment Date: 2024-08-03 Last Treatment Date: 2024-10-06   Plan Name: Prostate Site: Prostate Technique: IMRT Mode: Photon Dose Per Fraction: 2 Gy Prescribed Dose (Delivered / Prescribed): 80 Gy / 80 Gy Prescribed Fxs (Delivered / Prescribed): 40 / 40     ==========ON TREATMENT VISIT DATES========== 2024-08-09, 2024-08-15, 2024-08-22, 2024-08-29, 2024-09-05, 2024-09-12, 2024-09-19, 2024-09-26, 2024-10-03     ==========UPCOMING VISITS========== 11/06/2024 [facility] RAD ONCOLOGY FOLLOW UP 30 Chrystal, Marcey, MD        ==========APPENDIX - ON TREATMENT VISIT NOTES==========   See weekly On Treatment Notes in Epic for details in the Media tab (listed as Progress notes on the On Treatment Visit Dates listed above).

## 2024-10-30 ENCOUNTER — Other Ambulatory Visit: Payer: Self-pay | Admitting: Radiation Oncology

## 2024-11-06 ENCOUNTER — Ambulatory Visit: Admitting: Radiation Oncology

## 2024-11-07 ENCOUNTER — Telehealth: Payer: Self-pay | Admitting: Cardiovascular Disease

## 2024-11-07 DIAGNOSIS — Z79899 Other long term (current) drug therapy: Secondary | ICD-10-CM

## 2024-11-07 NOTE — Telephone Encounter (Signed)
 Called patient, lab order entered, will schedule OV when in for lab

## 2024-11-07 NOTE — Telephone Encounter (Signed)
 Taking 20 mg torsemide  x 1.5 weeks every day for swelling and pain in feet and ankles, last night was up to Left knee  Please advise on increasing dosage

## 2024-11-07 NOTE — Telephone Encounter (Signed)
 Pt c/o medication issue:  1. Name of Medication: torsemide  (DEMADEX ) 20 MG tablet   2. How are you currently taking this medication (dosage and times per day)? As prescribed   3. Are you having a reaction (difficulty breathing--STAT)? No   4. What is your medication issue? Pt asking if he can double up on torsemide  for increased swelling. Did not give info about swelling before hanging up. Please advise.

## 2024-11-27 ENCOUNTER — Ambulatory Visit: Admitting: Radiation Oncology

## 2025-02-01 ENCOUNTER — Inpatient Hospital Stay: Admitting: Oncology

## 2025-02-01 ENCOUNTER — Inpatient Hospital Stay
